# Patient Record
Sex: Male | Born: 1950
Health system: Southern US, Community
[De-identification: ages and names within clinical notes are randomized; demographics above are authoritative.]

## PROBLEM LIST (undated history)

## (undated) DIAGNOSIS — J189 Pneumonia, unspecified organism: Secondary | ICD-10-CM

## (undated) DIAGNOSIS — F17200 Nicotine dependence, unspecified, uncomplicated: Secondary | ICD-10-CM

## (undated) DIAGNOSIS — K5790 Diverticulosis of intestine, part unspecified, without perforation or abscess without bleeding: Secondary | ICD-10-CM

## (undated) DIAGNOSIS — E785 Hyperlipidemia, unspecified: Secondary | ICD-10-CM

## (undated) DIAGNOSIS — I10 Essential (primary) hypertension: Secondary | ICD-10-CM

## (undated) DIAGNOSIS — G4730 Sleep apnea, unspecified: Secondary | ICD-10-CM

## (undated) DIAGNOSIS — B1920 Unspecified viral hepatitis C without hepatic coma: Secondary | ICD-10-CM

## (undated) DIAGNOSIS — K635 Polyp of colon: Secondary | ICD-10-CM

## (undated) DIAGNOSIS — T7840XA Allergy, unspecified, initial encounter: Secondary | ICD-10-CM

## (undated) HISTORY — DX: Diverticulosis of intestine, part unspecified, without perforation or abscess without bleeding: K57.90

## (undated) HISTORY — PX: EYE SURGERY: SHX253

## (undated) HISTORY — PX: SKIN BIOPSY: SHX1

## (undated) HISTORY — DX: Nicotine dependence, unspecified, uncomplicated: F17.200

## (undated) HISTORY — DX: Sleep apnea, unspecified: G47.30

## (undated) HISTORY — DX: Unspecified viral hepatitis C without hepatic coma: B19.20

## (undated) HISTORY — DX: Allergy, unspecified, initial encounter: T78.40XA

## (undated) HISTORY — DX: Polyp of colon: K63.5

## (undated) HISTORY — DX: Hyperlipidemia, unspecified: E78.5

---

## 1999-10-28 ENCOUNTER — Ambulatory Visit (HOSPITAL_COMMUNITY): Admission: RE | Admit: 1999-10-28 | Discharge: 1999-10-28 | Payer: Self-pay | Admitting: Gastroenterology

## 1999-10-28 ENCOUNTER — Encounter (INDEPENDENT_AMBULATORY_CARE_PROVIDER_SITE_OTHER): Payer: Self-pay

## 2001-06-28 ENCOUNTER — Encounter (INDEPENDENT_AMBULATORY_CARE_PROVIDER_SITE_OTHER): Payer: Self-pay | Admitting: *Deleted

## 2001-06-28 ENCOUNTER — Ambulatory Visit (HOSPITAL_COMMUNITY): Admission: RE | Admit: 2001-06-28 | Discharge: 2001-06-28 | Payer: Self-pay | Admitting: Gastroenterology

## 2004-07-21 ENCOUNTER — Encounter: Admission: RE | Admit: 2004-07-21 | Discharge: 2004-07-21 | Payer: Self-pay | Admitting: Otolaryngology

## 2004-08-10 HISTORY — PX: ANTERIOR CRUCIATE LIGAMENT REPAIR: SHX115

## 2004-10-15 ENCOUNTER — Ambulatory Visit (HOSPITAL_COMMUNITY): Admission: RE | Admit: 2004-10-15 | Discharge: 2004-10-15 | Payer: Self-pay | Admitting: Orthopedic Surgery

## 2004-10-15 ENCOUNTER — Ambulatory Visit (HOSPITAL_BASED_OUTPATIENT_CLINIC_OR_DEPARTMENT_OTHER): Admission: RE | Admit: 2004-10-15 | Discharge: 2004-10-15 | Payer: Self-pay | Admitting: Orthopedic Surgery

## 2006-02-09 ENCOUNTER — Ambulatory Visit: Payer: Self-pay | Admitting: Family Medicine

## 2006-04-29 ENCOUNTER — Ambulatory Visit: Payer: Self-pay | Admitting: Family Medicine

## 2006-11-15 ENCOUNTER — Ambulatory Visit: Payer: Self-pay | Admitting: Family Medicine

## 2007-06-03 ENCOUNTER — Ambulatory Visit: Payer: Self-pay | Admitting: Family Medicine

## 2007-07-19 ENCOUNTER — Ambulatory Visit: Payer: Self-pay | Admitting: Family Medicine

## 2007-07-25 ENCOUNTER — Ambulatory Visit: Payer: Self-pay | Admitting: Family Medicine

## 2008-01-30 ENCOUNTER — Ambulatory Visit: Payer: Self-pay | Admitting: Family Medicine

## 2008-05-15 ENCOUNTER — Ambulatory Visit: Payer: Self-pay | Admitting: Family Medicine

## 2008-08-27 ENCOUNTER — Ambulatory Visit: Payer: Self-pay | Admitting: Family Medicine

## 2009-02-06 ENCOUNTER — Ambulatory Visit: Payer: Self-pay | Admitting: Family Medicine

## 2010-12-26 NOTE — Op Note (Signed)
Andrew Yang, Andrew Yang                ACCOUNT NO.:  [id number]   MEDICAL RECORD NO.:  [id number]          PATIENT TYPE:  AMB   LOCATION:  DSC                          FACILITY:  [facility]   PHYSICIAN:  Mila Homer. Sherlean Foot, M.D. DATE OF BIRTH:  1950-11-27   DATE OF PROCEDURE:  10/15/2004  DATE OF DISCHARGE:                                 OPERATIVE REPORT   PREOPERATIVE DIAGNOSIS:  Left knee anterior cruciate ligament tear.   POSTOPERATIVE DIAGNOSIS:  Left knee anterior cruciate ligament tear.   PROCEDURE:  Left knee anterior cruciate ligament reconstruction (allograft  bone tendon bone).   ANESTHESIA:  General plus preoperative femoral nerve block.   ASSISTANT:  Leanne Chang.   INDICATION FOR PROCEDURE:  The patient is a 61 year old white male with ACL  tear skiing last month.  Informed consent was obtained.   DESCRIPTION OF PROCEDURE:  The patient was laid supine and after  administration of general anesthesia the left leg was prepped and draped in  the usual sterile fashion.  Preoperative femoral nerve block was also  performed.  Inferolateral and inferomedial portals were created with a #11  blade for a blunt trocar and cannula.  Diagnostic arthroscopy revealed no  pathology in the patellofemoral joint.  The medial compartment had some  grade 1 to 2 very lateral OA changes which could have been the bone  contusion from the subluxation episode.  This was debrided with the  __________ shaver.  There was also a very obvious ACL tear with mop ends on  each side.  The lateral compartment was normal.  I then used the __________  shaver to remove the ACL.  I used the debridement wand to debride off the  lateral wall.  I then used the 4.0 mm cylindrical bur to perform an  aggressive notch plasty.  I then used the Arthrex instrumentation to drill  our tibial and femoral tunnels with a 2-mm posterior wall on the femoral  side.  I then passed the graft with the Beath needle.  The graft was  prepared at the beginning of the operation by my physician assistant.  I  then placed a 9.0 wire anterior to the femoral bone plug and placed a 7 x 25  mm metal screw.  I pulled tension to evaluate impingement and there was  none.  I then placed a second 7 x 25 mm screw with the leg in 10 degrees of  flexion with a posterior draw and tension on the graft.  This completely  eliminated the draw and Lachman.  Direct visualization and probing also  revealed that it was tight.  I then lavaged the knee and closed with  interrupted 2-0 Vicryl and the nylon throughout.  I dressed it with Adaptic,  4 x 4, sterile Webril and an Ace wrap.   COMPLICATIONS:  None.   DRAINS:  None.   TOURNIQUET TIME:  44 minutes.      SDL/MEDQ  D:  10/15/2004  T:  10/15/2004  Job:  [id number]

## 2011-04-08 ENCOUNTER — Encounter: Payer: Self-pay | Admitting: Family Medicine

## 2011-04-09 ENCOUNTER — Encounter: Payer: Self-pay | Admitting: Family Medicine

## 2011-04-09 ENCOUNTER — Ambulatory Visit (INDEPENDENT_AMBULATORY_CARE_PROVIDER_SITE_OTHER): Payer: BC Managed Care – PPO | Admitting: Family Medicine

## 2011-04-09 VITALS — BP 132/76 | HR 64 | Ht 72.0 in | Wt 213.0 lb

## 2011-04-09 DIAGNOSIS — R768 Other specified abnormal immunological findings in serum: Secondary | ICD-10-CM

## 2011-04-09 DIAGNOSIS — R894 Abnormal immunological findings in specimens from other organs, systems and tissues: Secondary | ICD-10-CM

## 2011-04-09 DIAGNOSIS — Z79899 Other long term (current) drug therapy: Secondary | ICD-10-CM

## 2011-04-09 DIAGNOSIS — Z125 Encounter for screening for malignant neoplasm of prostate: Secondary | ICD-10-CM

## 2011-04-09 DIAGNOSIS — Z23 Encounter for immunization: Secondary | ICD-10-CM

## 2011-04-09 DIAGNOSIS — E785 Hyperlipidemia, unspecified: Secondary | ICD-10-CM

## 2011-04-09 DIAGNOSIS — J309 Allergic rhinitis, unspecified: Secondary | ICD-10-CM

## 2011-04-09 NOTE — Progress Notes (Signed)
  Subjective:    Patient ID: Andrew Yang, male    DOB: 1951-05-16, 60 y.o.   MRN: [id number]  HPI He is here for a complete examination. He does have underlying allergies and they respond well to Actifed. He has not been on his statin in over a month. He has not been here in over 2 years. He does have underlying hepatitis see and would like a followup viral load. He is scheduled to see his gastroenterologist in the near future for colonoscopy. He does have underlying sleep apnea however is not using CPAP. He did have a dental appliance however he is also not using that. His work and home life are going well. Social and family history are in the chart and were reviewed.  Review of Systems Negative except as above    Objective:   Physical Exam BP 132/76  Pulse 64  Ht 6' (1.829 m)  Wt 213 lb (96.616 kg)  BMI 28.89 kg/m2  General Appearance:    Alert, cooperative, no distress, appears stated age  Head:    Normocephalic, without obvious abnormality, atraumatic  Eyes:    PERRL, conjunctiva/corneas clear, EOM's intact, fundi    benign  Ears:    Normal TM's and external ear canals  Nose:   Nares normal, mucosa normal, no drainage or sinus   tenderness  Throat:   Lips, mucosa, and tongue normal; teeth and gums normal  Neck:   Supple, no lymphadenopathy;  thyroid:  no   enlargement/tenderness/nodules; no carotid   bruit or JVD  Back:    Spine nontender, no curvature, ROM normal, no CVA     tenderness  Lungs:     Clear to auscultation bilaterally without wheezes, rales or     ronchi; respirations unlabored  Chest Wall:    No tenderness or deformity   Heart:    Regular rate and rhythm, S1 and S2 normal, no murmur, rub   or gallop  Breast Exam:    No chest wall tenderness, masses or gynecomastia  Abdomen:     Soft, non-tender, nondistended, normoactive bowel sounds,    no masses, no hepatosplenomegaly        Extremities:   No clubbing, cyanosis or edema  Pulses:   2+ and symmetric all  extremities  Skin:   Skin color, texture, turgor normal, no rashes or lesions  Lymph nodes:   Cervical, supraclavicular, and axillary nodes normal  Neurologic:   CNII-XII intact, normal strength, sensation and gait; reflexes 2+ and symmetric throughout          Psych:   Normal mood, affect, hygiene and grooming.           Assessment & Plan:  Allergic rhinitis. Hepatitis C. Hyperlipidemia. Sleep apnea. History of colonic polyps and hemorrhoids. Routine blood screening including PSA and hepatitis C viral load. Continue on present medication regimen.

## 2011-04-10 LAB — COMPREHENSIVE METABOLIC PANEL
ALT: 31 U/L (ref 0–53)
AST: 24 U/L (ref 0–37)
Albumin: 4.6 g/dL (ref 3.5–5.2)
Alkaline Phosphatase: 61 U/L (ref 39–117)
BUN: 13 mg/dL (ref 6–23)
CO2: 21 mEq/L (ref 19–32)
Calcium: 9.3 mg/dL (ref 8.4–10.5)
Chloride: 102 mEq/L (ref 96–112)
Creat: 0.97 mg/dL (ref 0.50–1.35)
Glucose, Bld: 99 mg/dL (ref 70–99)
Potassium: 4.4 mEq/L (ref 3.5–5.3)
Sodium: 138 mEq/L (ref 135–145)
Total Bilirubin: 0.7 mg/dL (ref 0.3–1.2)
Total Protein: 7.3 g/dL (ref 6.0–8.3)

## 2011-04-10 LAB — CBC WITH DIFFERENTIAL/PLATELET
Basophils Absolute: 0 10*3/uL (ref 0.0–0.1)
Basophils Relative: 1 % (ref 0–1)
Eosinophils Relative: 2 % (ref 0–5)
HCT: 46.6 % (ref 39.0–52.0)
Hemoglobin: 15.4 g/dL (ref 13.0–17.0)
MCH: 30.5 pg (ref 26.0–34.0)
MCHC: 33 g/dL (ref 30.0–36.0)
Monocytes Relative: 10 % (ref 3–12)
Neutro Abs: 3.7 10*3/uL (ref 1.7–7.7)
Neutrophils Relative %: 59 % (ref 43–77)
Platelets: 211 10*3/uL (ref 150–400)
RBC: 5.05 MIL/uL (ref 4.22–5.81)
RDW: 14.1 % (ref 11.5–15.5)
WBC: 6.2 10*3/uL (ref 4.0–10.5)

## 2011-04-10 LAB — LIPID PANEL
Cholesterol: 252 mg/dL — ABNORMAL HIGH (ref 0–200)
HDL: 35 mg/dL — ABNORMAL LOW (ref >39)
LDL Cholesterol: 175 mg/dL — ABNORMAL HIGH (ref 0–99)
Total CHOL/HDL Ratio: 7.2 Ratio
Triglycerides: 208 mg/dL — ABNORMAL HIGH (ref <150)
VLDL: 42 mg/dL — ABNORMAL HIGH (ref 0–40)

## 2011-04-10 LAB — PSA: PSA: 0.35 ng/mL (ref <=4.00)

## 2011-04-10 LAB — HEPATITIS C RNA QUANTITATIVE

## 2011-04-11 LAB — HM COLONOSCOPY

## 2011-04-14 ENCOUNTER — Telehealth: Payer: Self-pay

## 2011-04-14 ENCOUNTER — Telehealth: Payer: Self-pay | Admitting: Family Medicine

## 2011-04-14 NOTE — Telephone Encounter (Signed)
Left word with wife to have pt call and make appt to come in and discuss change of cholesterol med per [employer]

## 2011-04-15 MED ORDER — ATORVASTATIN CALCIUM 40 MG PO TABS: 40.0000 mg | ORAL_TABLET | Freq: Every day | ORAL | Status: DC

## 2011-04-15 NOTE — Telephone Encounter (Signed)
I will switch him to Lipitor and have him return here in 2 months

## 2011-04-17 ENCOUNTER — Ambulatory Visit (INDEPENDENT_AMBULATORY_CARE_PROVIDER_SITE_OTHER): Payer: BC Managed Care – PPO | Admitting: Family Medicine

## 2011-04-17 ENCOUNTER — Encounter: Payer: Self-pay | Admitting: Family Medicine

## 2011-04-17 VITALS — HR 67 | Wt 214.0 lb

## 2011-04-17 DIAGNOSIS — E785 Hyperlipidemia, unspecified: Secondary | ICD-10-CM

## 2011-04-17 DIAGNOSIS — F341 Dysthymic disorder: Secondary | ICD-10-CM

## 2011-04-17 MED ORDER — ROSUVASTATIN CALCIUM 20 MG PO TABS: 20.0000 mg | ORAL_TABLET | Freq: Every day | ORAL | Status: DC

## 2011-04-17 MED ORDER — VENLAFAXINE HCL ER 150 MG PO CP24: 150.0000 mg | ORAL_CAPSULE | Freq: Every day | ORAL | Status: DC

## 2011-04-17 MED ORDER — VENLAFAXINE HCL 75 MG PO TABS: 75.0000 mg | ORAL_TABLET | Freq: Two times a day (BID) | ORAL | Status: DC

## 2011-04-17 NOTE — Progress Notes (Signed)
  Subjective:    Patient ID: Andrew Yang, male    DOB: [birth year], 60 y.o.   MRN: [id number]  HPI He is here for consult concerning his cholesterol. He has been on medication for long period of time and in the past had been on Lipitor as well as Vytorin. His most recent blood work indicates a need for this to continue. He also recently stopped taking his Effexor and seems to be doing fairly well on this but has noted some increase in irritability. He would like to be placed back on this medication.  Review of Systems     Objective:   Physical Exam Alert and in no distress otherwise not examined       Assessment & Plan:   1. Hyperlipidemia LDL goal < 100   2. Dysthymia    I will place him on Crestor. Samples were given and instructions on proper use. I will also give him Effexor.

## 2011-04-20 LAB — HM COLONOSCOPY

## 2011-06-01 ENCOUNTER — Ambulatory Visit (INDEPENDENT_AMBULATORY_CARE_PROVIDER_SITE_OTHER): Payer: BC Managed Care – PPO | Admitting: Medical

## 2011-06-01 ENCOUNTER — Encounter: Payer: Self-pay | Admitting: Medical

## 2011-06-01 VITALS — BP 128/80 | HR 68 | Temp 97.9°F | Resp 16 | Ht 74.0 in | Wt 208.0 lb

## 2011-06-01 DIAGNOSIS — J029 Acute pharyngitis, unspecified: Secondary | ICD-10-CM

## 2011-06-01 DIAGNOSIS — E785 Hyperlipidemia, unspecified: Secondary | ICD-10-CM

## 2011-06-01 DIAGNOSIS — B1920 Unspecified viral hepatitis C without hepatic coma: Secondary | ICD-10-CM

## 2011-06-01 DIAGNOSIS — J329 Chronic sinusitis, unspecified: Secondary | ICD-10-CM

## 2011-06-01 DIAGNOSIS — G43909 Migraine, unspecified, not intractable, without status migrainosus: Secondary | ICD-10-CM | POA: Insufficient documentation

## 2011-06-01 MED ORDER — BUTALBITAL-APAP-CAFFEINE 50-300-40 MG PO CAPS: 50.0000 mg | ORAL_CAPSULE | ORAL | Status: DC | PRN

## 2011-06-01 MED ORDER — AMOXICILLIN 500 MG PO TABS: ORAL_TABLET | ORAL | Status: DC

## 2011-06-01 NOTE — Progress Notes (Signed)
Subjective:   HPI  Andrew Yang is a 60 y.o. male who presents for illness.  Here because he feels awful.  Has had bad cold for 4-5 days, sinus and nasal congestion, had fever to 102 last night, but he had been in the hot tub.  The throat is bad.  Sinus drainage burns his throat.  Using OTC cough suppressant.   Has dry "unproductive spasmatic cough."  Using Mucinex.  Daughter sick with similar.    He was here recently for physical and labs, but hasn't gotten call back on results.  He has questions about his Hep C, cholesterol and PSA labs.    No other c/o.  Past Medical History  Diagnosis Date  . Allergy     RHINITIS  . Dyslipidemia   . Smoker   . Hepatitis C   . Diverticulosis   . Colonic polyp   . Hemorrhoids   . Sleep apnea     Review of Systems Constitutional: + fever,+ chills,+ sweats,+ fatigue ,denies weight change ENT: + runny nose, ,+ sore throat, no ear pain Cardiology: denies chest pain, palpitations, edema Respiratory: DENIES, cough, shortness of breath, wheezing Gastroenterology: denies abdominal pain, nausea, vomiting, diarrhea, constipation Hematology: denies bleeding or bruising problems Musculoskeletal: denies arthralgias, myalgias, joint swelling, back pain Ophthalmology: denies vision changes Urology: denies dysuria, difficulty urinating, hematuria, urinary frequency, urgency Neurology: no headache, weakness, tingling, numbness   Objective:   Physical Exam  Filed Vitals:   06/01/11 1023  BP: 128/80  Pulse: 68  Temp: 97.9 F (36.6 C)  Resp: 16    General appearance: alert, no distress, WD/WN, ill appearing HEENT: normocephalic, sclerae anicteric, conjunctiva pink and moist, TMs pearly, nares with turbinate swelling, muculopurulent discharge and erythema, pharynx with erythema Oral cavity: MMM, no lesions Neck: supple, shoddy tender lymphadenopathy, no thyromegaly, no masses Heart: RRR, normal S1, S2, no murmurs Lungs: CTA bilaterally, no wheezes,  rhonchi, or rales Pulses: 2+ symmetric   Assessment and Plan :    Encounter Diagnoses  Name Primary?  . Sinusitis Yes  . Pharyngitis, acute   . Hepatitis C virus   . Hyperlipidemia   . Migraine    Sinusitis and pharyngitis - script for Amoxicillin, rest, hydrate well, discussed supportive care.  Calll if worse or not improving in 3-5 days.  Discussed his recent lab results.   Hep C virus - discussed recent labs which show no virus detected on Hep C viral load.  Hyperlipidemia - c/t Crestor, discussed compliance.  Migraine - refilled his medication today.

## 2011-06-01 NOTE — Progress Notes (Signed)
  Subjective:    Patient ID: Andrew Yang, male    DOB: Feb 09, 1951, 60 y.o.   MRN: [id number]  HPI    Review of Systems     Objective:   Physical Exam        Assessment & Plan:

## 2011-07-28 ENCOUNTER — Other Ambulatory Visit: Payer: Self-pay | Admitting: Dermatology

## 2011-08-20 ENCOUNTER — Telehealth: Payer: Self-pay | Admitting: Family Medicine

## 2011-08-20 MED ORDER — ROSUVASTATIN CALCIUM 10 MG PO TABS: 10.0000 mg | ORAL_TABLET | Freq: Every day | ORAL | Status: DC

## 2011-08-20 NOTE — Telephone Encounter (Signed)
Sent cholesterol med in

## 2011-08-20 NOTE — Telephone Encounter (Signed)
Sent crestor in 

## 2011-08-26 ENCOUNTER — Telehealth: Payer: Self-pay | Admitting: Family Medicine

## 2011-08-26 MED ORDER — ROSUVASTATIN CALCIUM 20 MG PO TABS: 20.0000 mg | ORAL_TABLET | Freq: Every day | ORAL | Status: DC

## 2011-08-26 NOTE — Telephone Encounter (Signed)
Crestor 20 mg written.

## 2012-04-30 ENCOUNTER — Other Ambulatory Visit: Payer: Self-pay | Admitting: Family Medicine

## 2012-05-02 NOTE — Telephone Encounter (Signed)
Pt is overdue for OV. Please ask him to call for an appt for further refills.Thanks.

## 2012-05-30 ENCOUNTER — Other Ambulatory Visit: Payer: Self-pay | Admitting: Dermatology

## 2012-06-25 ENCOUNTER — Other Ambulatory Visit: Payer: Self-pay | Admitting: Family Medicine

## 2012-07-04 ENCOUNTER — Other Ambulatory Visit: Payer: Self-pay | Admitting: Family Medicine

## 2012-07-04 ENCOUNTER — Telehealth: Payer: Self-pay | Admitting: Family Medicine

## 2012-07-04 ENCOUNTER — Telehealth: Payer: Self-pay

## 2012-07-04 ENCOUNTER — Other Ambulatory Visit: Payer: Self-pay

## 2012-07-04 DIAGNOSIS — Z79899 Other long term (current) drug therapy: Secondary | ICD-10-CM

## 2012-07-04 NOTE — Telephone Encounter (Signed)
And get the labs on that day and we need CBC, Cmet, lipids and hepatitis C viral load

## 2012-07-04 NOTE — Telephone Encounter (Signed)
Called pt to inform him he can have all blood drawn same day

## 2012-07-04 NOTE — Telephone Encounter (Signed)
Called and left message for pt he must have an med check before anymore refills I filled for 30 days

## 2012-07-06 ENCOUNTER — Other Ambulatory Visit: Payer: BC Managed Care – PPO

## 2012-07-06 ENCOUNTER — Other Ambulatory Visit: Payer: Self-pay | Admitting: Family Medicine

## 2012-07-06 DIAGNOSIS — Z79899 Other long term (current) drug therapy: Secondary | ICD-10-CM

## 2012-07-06 LAB — CBC WITH DIFFERENTIAL/PLATELET
Basophils Absolute: 0 10*3/uL (ref 0.0–0.1)
Basophils Relative: 1 % (ref 0–1)
Eosinophils Absolute: 0.1 10*3/uL (ref 0.0–0.7)
Eosinophils Relative: 2 % (ref 0–5)
HCT: 47.3 % (ref 39.0–52.0)
Hemoglobin: 16.1 g/dL (ref 13.0–17.0)
Lymphocytes Relative: 31 % (ref 12–46)
Lymphs Abs: 2 10*3/uL (ref 0.7–4.0)
MCH: 30.3 pg (ref 26.0–34.0)
MCHC: 34 g/dL (ref 30.0–36.0)
MCV: 89.1 fL (ref 78.0–100.0)
Monocytes Relative: 11 % (ref 3–12)
Neutro Abs: 3.5 10*3/uL (ref 1.7–7.7)
Neutrophils Relative %: 55 % (ref 43–77)
Platelets: 189 10*3/uL (ref 150–400)
RBC: 5.31 MIL/uL (ref 4.22–5.81)

## 2012-07-07 LAB — COMPREHENSIVE METABOLIC PANEL
ALT: 40 U/L (ref 0–53)
AST: 29 U/L (ref 0–37)
Albumin: 4.8 g/dL (ref 3.5–5.2)
Alkaline Phosphatase: 66 U/L (ref 39–117)
BUN: 12 mg/dL (ref 6–23)
Calcium: 9.4 mg/dL (ref 8.4–10.5)
Chloride: 103 mEq/L (ref 96–112)
Creat: 0.89 mg/dL (ref 0.50–1.35)
Glucose, Bld: 115 mg/dL — ABNORMAL HIGH (ref 70–99)
Potassium: 4.6 mEq/L (ref 3.5–5.3)
Sodium: 138 mEq/L (ref 135–145)
Total Bilirubin: 0.5 mg/dL (ref 0.3–1.2)
Total Protein: 7.4 g/dL (ref 6.0–8.3)

## 2012-07-08 LAB — HEMOGLOBIN A1C
Hgb A1c MFr Bld: 5.9 % — ABNORMAL HIGH (ref <5.7)
Mean Plasma Glucose: 123 mg/dL — ABNORMAL HIGH (ref <117)

## 2012-07-08 LAB — HEPATITIS C RNA QUANTITATIVE: HCV Quantitative: NOT DETECTED IU/mL (ref <15)

## 2012-07-26 ENCOUNTER — Ambulatory Visit (INDEPENDENT_AMBULATORY_CARE_PROVIDER_SITE_OTHER): Payer: BC Managed Care – PPO | Admitting: Family Medicine

## 2012-07-26 ENCOUNTER — Encounter: Payer: Self-pay | Admitting: Family Medicine

## 2012-07-26 VITALS — BP 140/82 | HR 78 | Ht 72.0 in | Wt 216.0 lb

## 2012-07-26 DIAGNOSIS — R768 Other specified abnormal immunological findings in serum: Secondary | ICD-10-CM

## 2012-07-26 DIAGNOSIS — E785 Hyperlipidemia, unspecified: Secondary | ICD-10-CM

## 2012-07-26 DIAGNOSIS — R7302 Impaired glucose tolerance (oral): Secondary | ICD-10-CM | POA: Insufficient documentation

## 2012-07-26 DIAGNOSIS — R7309 Other abnormal glucose: Secondary | ICD-10-CM

## 2012-07-26 DIAGNOSIS — Z23 Encounter for immunization: Secondary | ICD-10-CM

## 2012-07-26 DIAGNOSIS — Z0000 Encounter for general adult medical examination without abnormal findings: Secondary | ICD-10-CM

## 2012-07-26 DIAGNOSIS — J309 Allergic rhinitis, unspecified: Secondary | ICD-10-CM

## 2012-07-26 DIAGNOSIS — R7689 Other specified abnormal immunological findings in serum: Secondary | ICD-10-CM

## 2012-07-26 DIAGNOSIS — Z2911 Encounter for prophylactic immunotherapy for respiratory syncytial virus (RSV): Secondary | ICD-10-CM

## 2012-07-26 DIAGNOSIS — R894 Abnormal immunological findings in specimens from other organs, systems and tissues: Secondary | ICD-10-CM

## 2012-07-26 NOTE — Progress Notes (Signed)
GAVE PT ZOSTER,TDAP,AND FLU ALSO EKG

## 2012-07-26 NOTE — Progress Notes (Signed)
Subjective:    Patient ID: Andrew Yang, male    DOB: Aug 30, 1950, 61 y.o.   MRN: [id number]  HPI He is here for complete examination. He does have an underlying history of hepatitis C however he has had negative viral loads the last several years. Continues on Crestor and is having no difficulty with this. He has been on Effexor for several years and states that he wants to continue on this. He states it helps keep him stable. He does have intermittent difficulty with back pain and usually with physical therapy, anti-inflammatories and muscle relaxants he does fairly well. He does have underlying allergies and uses an antihistamine regularly. His work and home life are going quite well. He had a colonoscopy in 2000 and well. Immunizations were reviewed.   Review of Systems  Constitutional: Negative.   HENT: Negative.   Eyes: Negative.   Respiratory: Negative.   Cardiovascular: Negative.   Gastrointestinal: Negative.   Genitourinary: Negative.   Musculoskeletal: Negative.   Skin: Negative.   Neurological: Negative.   Hematological: Negative.   Psychiatric/Behavioral: Negative.        Objective:   Physical Exam BP 140/82  Pulse 78  Ht 6' (1.829 m)  Wt 216 lb (97.977 kg)  BMI 29.29 kg/m2  SpO2 97%  General Appearance:    Alert, cooperative, no distress, appears stated age  Head:    Normocephalic, without obvious abnormality, atraumatic  Eyes:    PERRL, conjunctiva/corneas clear, EOM's intact, fundi    benign  Ears:    Normal TM's and external ear canals  Nose:   Nares normal, mucosa normal, no drainage or sinus   tenderness  Throat:   Lips, mucosa, and tongue normal; teeth and gums normal  Neck:   Supple, no lymphadenopathy;  thyroid:  no/nodules; no carotid   bruit or JVD  Back:    Spine nontender, no curvature, ROM normal, no CVA     tenderness  Lungs:     Clear to auscultation bilaterally without wheezes, rales or     ronchi; respirations unlabored  Chest Wall:    No  tenderness or deformity   Heart:    Regular rate and rhythm, S1 and S2 normal, no murmur, rub   or gallop  Breast Exam:    No chest wall tenderness, masses or gynecomastia  Abdomen:     Soft, non-tender, nondistended, normoactive bowel sounds,    no masses, no hepatosplenomegaly  Genitalia:    Normal male external genitalia without lesions.  Testicles without masses.  No inguinal hernias.  Rectal:  deferred  Extremities:   No clubbing, cyanosis or edema  Pulses:   2+ and symmetric all extremities  Skin:   Skin color, texture, turgor normal, no rashes or lesions  Lymph nodes:   Cervical, supraclavicular, and axillary nodes normal  Neurologic:   CNII-XII intact, normal strength, sensation and gait; reflexes 2+ and symmetric throughout          Psych:   Normal mood, affect, hygiene and grooming.   Laboratory data was reviewed. Hemoglobin A1c is 5.9. Hepatitis C viral load is undetectable EKG shows no acute changes      Assessment & Plan:   1. Need for prophylactic vaccination and inoculation against influenza  Flu vaccine greater than or equal to 3yo preservative free IM  2. Immunization due  Tdap vaccine greater than or equal to 7yo IM  3. Hepatitis C antibody test positive    4. Hyperlipidemia LDL goal < 100  Lipid panel  5. Chronic allergic rhinitis    6. Routine general medical examination at a health care facility  PR ELECTROCARDIOGRAM, COMPLETE, Varicella-zoster vaccine subcutaneous, PSA  7. Glucose intolerance (impaired glucose tolerance)     discuss glucose intolerance and his risk for diabetes with this. Strongly encouraged him to make diet and exercise changes to help with this. Also suggested that he cut back on his alcohol consumption. His immunizations were updated. Risks and benefits of the shots were discussed.

## 2012-07-28 ENCOUNTER — Other Ambulatory Visit (INDEPENDENT_AMBULATORY_CARE_PROVIDER_SITE_OTHER): Payer: BC Managed Care – PPO

## 2012-07-28 DIAGNOSIS — E785 Hyperlipidemia, unspecified: Secondary | ICD-10-CM

## 2012-07-28 DIAGNOSIS — Z0000 Encounter for general adult medical examination without abnormal findings: Secondary | ICD-10-CM

## 2012-07-28 LAB — POCT URINALYSIS DIPSTICK
Bilirubin, UA: NEGATIVE
Blood, UA: NEGATIVE
Glucose, UA: NEGATIVE
Ketones, UA: NEGATIVE
Leukocytes, UA: NEGATIVE
Nitrite, UA: NEGATIVE
Protein, UA: NEGATIVE
Spec Grav, UA: 1.025
Urobilinogen, UA: NEGATIVE
pH, UA: 5

## 2012-07-28 LAB — LIPID PANEL
Cholesterol: 190 mg/dL (ref 0–200)
HDL: 41 mg/dL (ref >39)
LDL Cholesterol: 104 mg/dL — ABNORMAL HIGH (ref 0–99)
Total CHOL/HDL Ratio: 4.6 Ratio
Triglycerides: 224 mg/dL — ABNORMAL HIGH (ref <150)

## 2012-07-28 LAB — PSA: PSA: 0.39 ng/mL (ref <=4.00)

## 2012-08-01 NOTE — Progress Notes (Signed)
Quick Note:  Called pt home # left message lipids and psa look great continue on present meds ______

## 2012-08-28 ENCOUNTER — Other Ambulatory Visit: Payer: Self-pay | Admitting: Family Medicine

## 2012-09-09 ENCOUNTER — Other Ambulatory Visit: Payer: Self-pay | Admitting: Family Medicine

## 2013-01-05 ENCOUNTER — Other Ambulatory Visit: Payer: Self-pay | Admitting: Dermatology

## 2013-04-14 ENCOUNTER — Telehealth: Payer: Self-pay | Admitting: Internal Medicine

## 2013-04-14 ENCOUNTER — Other Ambulatory Visit: Payer: Self-pay | Admitting: Family Medicine

## 2013-04-14 MED ORDER — BUTALBITAL-APAP-CAFFEINE 50-300-40 MG PO CAPS: 50.0000 mg | ORAL_CAPSULE | ORAL | Status: DC | PRN

## 2013-04-14 NOTE — Telephone Encounter (Signed)
CALLED MED IN  

## 2013-04-14 NOTE — Telephone Encounter (Signed)
Refill request for butal-acet-caff 50-325-40mg  #30 to [facility]

## 2013-04-14 NOTE — Telephone Encounter (Signed)
Renew this 

## 2013-06-15 ENCOUNTER — Other Ambulatory Visit: Payer: Self-pay

## 2013-07-31 ENCOUNTER — Telehealth: Payer: Self-pay | Admitting: Internal Medicine

## 2013-07-31 MED ORDER — ONDANSETRON HCL 4 MG PO TABS: 4.0000 mg | ORAL_TABLET | Freq: Three times a day (TID) | ORAL | Status: DC | PRN

## 2013-07-31 NOTE — Telephone Encounter (Signed)
He has been having difficulty with vomiting nausea and diarrhea. I will try him initially on Zofran and if no improvement he will need to come in for an injection

## 2013-07-31 NOTE — Telephone Encounter (Signed)
Pt would like you to call him when you get a chance. He has a few questions to ask you about what he called in today for.

## 2013-07-31 NOTE — Telephone Encounter (Signed)
Pt wife called stating that pt has been throwing up for the last hour and having diarrhea for the last 4 hours constantly. Send to [facility]. Pt wife states he needs something for nausea

## 2013-09-11 ENCOUNTER — Other Ambulatory Visit: Payer: Self-pay | Admitting: Family Medicine

## 2013-09-21 ENCOUNTER — Other Ambulatory Visit: Payer: Self-pay | Admitting: Family Medicine

## 2013-09-21 NOTE — Telephone Encounter (Signed)
Pt needs RF Effexor, has CPE scheduled 10/03/13

## 2013-09-21 NOTE — Telephone Encounter (Signed)
Is this okay?

## 2013-09-21 NOTE — Telephone Encounter (Signed)
Pt scheduled for physical next week

## 2013-09-21 NOTE — Telephone Encounter (Signed)
Tell him that I called a month's worth in. Let him know that he needs a med check appointment

## 2013-10-03 ENCOUNTER — Encounter: Payer: Self-pay | Admitting: Family Medicine

## 2013-10-03 ENCOUNTER — Ambulatory Visit (INDEPENDENT_AMBULATORY_CARE_PROVIDER_SITE_OTHER): Payer: BC Managed Care – PPO | Admitting: Family Medicine

## 2013-10-03 VITALS — BP 170/100 | HR 80 | Ht 73.0 in | Wt 202.5 lb

## 2013-10-03 DIAGNOSIS — N529 Male erectile dysfunction, unspecified: Secondary | ICD-10-CM | POA: Insufficient documentation

## 2013-10-03 DIAGNOSIS — M129 Arthropathy, unspecified: Secondary | ICD-10-CM

## 2013-10-03 DIAGNOSIS — J309 Allergic rhinitis, unspecified: Secondary | ICD-10-CM

## 2013-10-03 DIAGNOSIS — E785 Hyperlipidemia, unspecified: Secondary | ICD-10-CM

## 2013-10-03 DIAGNOSIS — M25512 Pain in left shoulder: Secondary | ICD-10-CM

## 2013-10-03 DIAGNOSIS — R7302 Impaired glucose tolerance (oral): Secondary | ICD-10-CM

## 2013-10-03 DIAGNOSIS — R7309 Other abnormal glucose: Secondary | ICD-10-CM

## 2013-10-03 DIAGNOSIS — I10 Essential (primary) hypertension: Secondary | ICD-10-CM | POA: Insufficient documentation

## 2013-10-03 DIAGNOSIS — M25519 Pain in unspecified shoulder: Secondary | ICD-10-CM

## 2013-10-03 DIAGNOSIS — F341 Dysthymic disorder: Secondary | ICD-10-CM

## 2013-10-03 DIAGNOSIS — M1990 Unspecified osteoarthritis, unspecified site: Secondary | ICD-10-CM | POA: Insufficient documentation

## 2013-10-03 DIAGNOSIS — Z0000 Encounter for general adult medical examination without abnormal findings: Secondary | ICD-10-CM

## 2013-10-03 DIAGNOSIS — R768 Other specified abnormal immunological findings in serum: Secondary | ICD-10-CM

## 2013-10-03 DIAGNOSIS — R894 Abnormal immunological findings in specimens from other organs, systems and tissues: Secondary | ICD-10-CM

## 2013-10-03 LAB — COMPREHENSIVE METABOLIC PANEL
ALK PHOS: 57 U/L (ref 39–117)
ALT: 26 U/L (ref 0–53)
AST: 22 U/L (ref 0–37)
Albumin: 4.6 g/dL (ref 3.5–5.2)
BILIRUBIN TOTAL: 0.6 mg/dL (ref 0.2–1.2)
BUN: 11 mg/dL (ref 6–23)
CALCIUM: 9.3 mg/dL (ref 8.4–10.5)
CO2: 30 mEq/L (ref 19–32)
CREATININE: 0.84 mg/dL (ref 0.50–1.35)
Chloride: 101 mEq/L (ref 96–112)
Glucose, Bld: 103 mg/dL — ABNORMAL HIGH (ref 70–99)
Potassium: 4.4 mEq/L (ref 3.5–5.3)
Sodium: 137 mEq/L (ref 135–145)
Total Protein: 7.2 g/dL (ref 6.0–8.3)

## 2013-10-03 LAB — LIPID PANEL
CHOL/HDL RATIO: 4.8 ratio
Cholesterol: 201 mg/dL — ABNORMAL HIGH (ref 0–200)
HDL: 42 mg/dL (ref >39)
LDL Cholesterol: 123 mg/dL — ABNORMAL HIGH (ref 0–99)
Triglycerides: 181 mg/dL — ABNORMAL HIGH (ref <150)
VLDL: 36 mg/dL (ref 0–40)

## 2013-10-03 LAB — CBC WITH DIFFERENTIAL/PLATELET
BASOS ABS: 0 10*3/uL (ref 0.0–0.1)
BASOS PCT: 0 % (ref 0–1)
EOS ABS: 0.1 10*3/uL (ref 0.0–0.7)
EOS PCT: 2 % (ref 0–5)
HEMATOCRIT: 47.5 % (ref 39.0–52.0)
Hemoglobin: 16.4 g/dL (ref 13.0–17.0)
Lymphocytes Relative: 36 % (ref 12–46)
Lymphs Abs: 1.8 10*3/uL (ref 0.7–4.0)
MCH: 30.3 pg (ref 26.0–34.0)
MCHC: 34.5 g/dL (ref 30.0–36.0)
MCV: 87.8 fL (ref 78.0–100.0)
MONO ABS: 0.6 10*3/uL (ref 0.1–1.0)
Monocytes Relative: 12 % (ref 3–12)
Neutro Abs: 2.5 10*3/uL (ref 1.7–7.7)
Neutrophils Relative %: 50 % (ref 43–77)
PLATELETS: 184 10*3/uL (ref 150–400)
RBC: 5.41 MIL/uL (ref 4.22–5.81)
RDW: 14.3 % (ref 11.5–15.5)
WBC: 4.9 10*3/uL (ref 4.0–10.5)

## 2013-10-03 MED ORDER — VENLAFAXINE HCL 75 MG PO TABS: ORAL_TABLET | ORAL | Status: DC

## 2013-10-03 MED ORDER — LISINOPRIL-HYDROCHLOROTHIAZIDE 10-12.5 MG PO TABS: 1.0000 | ORAL_TABLET | Freq: Every day | ORAL | Status: DC

## 2013-10-03 MED ORDER — CELECOXIB 200 MG PO CAPS: 200.0000 mg | ORAL_CAPSULE | Freq: Two times a day (BID) | ORAL | Status: DC

## 2013-10-03 MED ORDER — ROSUVASTATIN CALCIUM 20 MG PO TABS: ORAL_TABLET | ORAL | Status: DC

## 2013-10-03 MED ORDER — TADALAFIL 5 MG PO TABS: 5.0000 mg | ORAL_TABLET | Freq: Every day | ORAL | Status: DC | PRN

## 2013-10-03 NOTE — Progress Notes (Signed)
Subjective:    Patient ID: Andrew Yang, male    DOB: 08-31-1950, 63 y.o.   MRN: [id number]  HPI He is here for a complete examination. He has been checking his blood pressure at home and states that it has been in the 160-70/100 range for the last 6 months. He does have underlying allergies and continues on Actifed. He notes change in weather patterns cause rhinorrhea. He has a previous history of hepatitis C with treatment and gets routine viral load followup. He continues on Crestor and is having no difficulty with that. He has noted some recent difficulty with getting and maintaining erections and would like help with this. Review of record indicates a previous history of glucose intolerance. He also has an underlying history of dysthymia and has been doing quite well on Zoloft. He wants to continue on this. He was recently seen for left shoulder pain and given an injection. The injected and lasted approximately one month and he is now having increased difficulty. He has occasional arthritic aches and pains especially in his back and does use Celebrex with good results. His family and social history were reviewed. 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 Immunizations and health maintenance issues were reviewed.   Review of Systems  All other systems reviewed and are negative.       Objective:   Physical Exam BP 170/100  Pulse 80  Ht 6\' 1"  (1.854 m)  Wt 202 lb 8 oz (91.853 kg)  BMI 26.72 kg/m2  General Appearance:    Alert, cooperative, no distress, appears stated age  Head:    Normocephalic, without obvious abnormality, atraumatic  Eyes:    PERRL, conjunctiva/corneas clear, EOM's intact, fundi    benign  Ears:    Normal TM's and external ear canals  Nose:   Nares normal, mucosa normal, no drainage or sinus   tenderness  Throat:   Lips, mucosa, and tongue normal; teeth and gums normal  Neck:   Supple, no lymphadenopathy;  thyroid:  no   enlargement/tenderness/nodules; no carotid  bruit or JVD  Back:    Spine nontender, no curvature, ROM normal, no CVA     tenderness  Lungs:     Clear to auscultation bilaterally without wheezes, rales or     ronchi; respirations unlabored  Chest Wall:    No tenderness or deformity   Heart:    Regular rate and rhythm, S1 and S2 normal, no murmur, rub   or gallop  Breast Exam:    No chest wall tenderness, masses or gynecomastia  Abdomen:     Soft, non-tender, nondistended, normoactive bowel sounds,    no masses, no hepatosplenomegaly  Genitalia:    Normal male external genitalia without lesions.  Testicles without masses.  No inguinal hernias.  Rectal:  Deferred. Stool cards given  Extremities:   No clubbing, cyanosis or edema  Pulses:   2+ and symmetric all extremities  Skin:   Skin color, texture, turgor normal, no rashes or lesions  Lymph nodes:   Cervical, supraclavicular, and axillary nodes normal  Neurologic:   CNII-XII intact, normal strength, sensation and gait; reflexes 2+ and symmetric throughout          Psych:   Normal mood, affect, hygiene and grooming.          Assessment & Plan:  Routine general medical examination at a health care facility - Plan: CBC with Differential, Comprehensive metabolic panel, Lipid panel  ED (erectile dysfunction) - Plan: tadalafil (  CIALIS) 5 MG tablet  Hypertension - Plan: lisinopril-hydrochlorothiazide (PRINZIDE,ZESTORETIC) 10-12.5 MG per tablet  Hyperlipidemia LDL goal < 100 - Plan: rosuvastatin (CRESTOR) 20 MG tablet, Lipid panel  Glucose intolerance (impaired glucose tolerance) - Plan: Comprehensive metabolic panel  Chronic allergic rhinitis  Hepatitis C antibody test positive - Plan: Hepatitis C RNA quantitative  Arthritis - Plan: celecoxib (CELEBREX) 200 MG capsule  Left shoulder pain  Dysthymia - Plan: venlafaxine (EFFEXOR) 75 MG tablet discussed the use of Cialis with him in regard to using it on a daily basis is supposed to as needed. Discussed the concept of priming  the pump in terms of not necessarily needing the medication on regular basis.  Samples were given and a prescription was written. I will place him on lisinopril/HCTZ. Discussed possible side effects including cough, swelling and ED issues. We'll also renew his Celebrex, Crestor and Effexor. Stool cards were given as he did have a episode of rectal bleeding today with a hard BM. Recheck blood pressure in one month.

## 2013-10-04 LAB — HEPATITIS C RNA QUANTITATIVE: HCV QUANT: NOT DETECTED [IU]/mL (ref <15)

## 2013-10-10 ENCOUNTER — Telehealth: Payer: Self-pay | Admitting: Family Medicine

## 2013-10-10 NOTE — Telephone Encounter (Signed)
Pt states he has tried Ibuprofen & Aleve and neither help control his symtpoms.  Also Ibuprofen upsets his stomach

## 2013-10-13 NOTE — Telephone Encounter (Signed)
P.A. Approved, pt informed, faxed pharmacy 

## 2013-10-30 ENCOUNTER — Encounter: Payer: Self-pay | Admitting: Family Medicine

## 2013-10-30 ENCOUNTER — Ambulatory Visit (INDEPENDENT_AMBULATORY_CARE_PROVIDER_SITE_OTHER): Payer: BC Managed Care – PPO | Admitting: Family Medicine

## 2013-10-30 VITALS — BP 150/98 | HR 64 | Wt 208.0 lb

## 2013-10-30 DIAGNOSIS — I10 Essential (primary) hypertension: Secondary | ICD-10-CM

## 2013-10-30 MED ORDER — LISINOPRIL-HYDROCHLOROTHIAZIDE 20-12.5 MG PO TABS: 1.0000 | ORAL_TABLET | Freq: Every day | ORAL | Status: DC

## 2013-10-30 NOTE — Progress Notes (Signed)
   Subjective:    Patient ID: Andrew Yang, male    DOB: 01-07-1951, 63 y.o.   MRN: [id number]  HPI He is here for recheck on his blood pressure. He did bring his BP cuff with him. It is approximately 3 or 4 points higher with both diastolic and systolic than ours.   Review of Systems     Objective:   Physical Exam Alert and in no distress. Blood pressure is recorded.       Assessment & Plan:  Hypertension - Plan: lisinopril-hydrochlorothiazide (ZESTORETIC) 20-12.5 MG per tablet  I will increase his Zestoretic to 20/12.5. He will e-mail me in about one month and let he know what blood pressure readings he is getting on his machine. Explained over time he will need more medication to control his blood pressure.

## 2013-10-30 NOTE — Patient Instructions (Signed)
E-mail me in a month with your blood pressure readings

## 2013-10-31 ENCOUNTER — Other Ambulatory Visit: Payer: Self-pay | Admitting: Dermatology

## 2014-01-16 ENCOUNTER — Other Ambulatory Visit: Payer: Self-pay | Admitting: Family Medicine

## 2014-04-17 ENCOUNTER — Encounter: Payer: Self-pay | Admitting: Family Medicine

## 2014-04-17 ENCOUNTER — Ambulatory Visit (INDEPENDENT_AMBULATORY_CARE_PROVIDER_SITE_OTHER): Payer: BC Managed Care – PPO | Admitting: Family Medicine

## 2014-04-17 VITALS — BP 180/120 | HR 80 | Wt 210.0 lb

## 2014-04-17 DIAGNOSIS — N529 Male erectile dysfunction, unspecified: Secondary | ICD-10-CM

## 2014-04-17 DIAGNOSIS — G4733 Obstructive sleep apnea (adult) (pediatric): Secondary | ICD-10-CM

## 2014-04-17 DIAGNOSIS — T464X5A Adverse effect of angiotensin-converting-enzyme inhibitors, initial encounter: Secondary | ICD-10-CM

## 2014-04-17 DIAGNOSIS — I10 Essential (primary) hypertension: Secondary | ICD-10-CM

## 2014-04-17 DIAGNOSIS — R059 Cough, unspecified: Secondary | ICD-10-CM

## 2014-04-17 DIAGNOSIS — T465X5A Adverse effect of other antihypertensive drugs, initial encounter: Secondary | ICD-10-CM

## 2014-04-17 DIAGNOSIS — Z23 Encounter for immunization: Secondary | ICD-10-CM

## 2014-04-17 DIAGNOSIS — R058 Other specified cough: Secondary | ICD-10-CM | POA: Insufficient documentation

## 2014-04-17 DIAGNOSIS — R05 Cough: Secondary | ICD-10-CM

## 2014-04-17 DIAGNOSIS — M549 Dorsalgia, unspecified: Secondary | ICD-10-CM

## 2014-04-17 DIAGNOSIS — G8929 Other chronic pain: Secondary | ICD-10-CM | POA: Insufficient documentation

## 2014-04-17 MED ORDER — TADALAFIL 20 MG PO TABS: 20.0000 mg | ORAL_TABLET | Freq: Every day | ORAL | Status: DC | PRN

## 2014-04-17 MED ORDER — METAXALONE 800 MG PO TABS: 800.0000 mg | ORAL_TABLET | ORAL | Status: DC | PRN

## 2014-04-17 MED ORDER — LOSARTAN POTASSIUM-HCTZ 100-12.5 MG PO TABS: 1.0000 | ORAL_TABLET | Freq: Every day | ORAL | Status: DC

## 2014-04-17 NOTE — Progress Notes (Signed)
   Subjective:    Patient ID: Andrew Yang, male    DOB: March 27, 1951, 63 y.o.   MRN: [id number]  HPI He is here for consultation. He did have difficulty with cough from his lisinopril and needs to be switched to a different medication. He has been off medication for approximately 4 months. He would also like a refill on Cialis. Apparently his insurance would not cover the 5 mg dosing. He also has a history of chronic back pain and does occasionally require the use of Skelaxin. He would also like to be referred to physical therapy to help with this. He has a previous history of sleep apnea however he has not been using the CPAP machine for quite some time. In fact he actually turned it back in. His wife is now strongly encouraged to him to get a machine again. This was diagnosed in 2010. Alert and in no distress otherwise not examined   Review of Systems     Objective:   Physical Exam        Assessment & Plan:  Obstructive sleep apnea  Need for prophylactic vaccination and inoculation against influenza - Plan: Flu Vaccine QUAD 36+ mos PF IM (Fluarix Quad PF)  Essential hypertension - Plan: losartan-hydrochlorothiazide (HYZAAR) 100-12.5 MG per tablet  ACE-inhibitor cough  Erectile dysfunction, unspecified erectile dysfunction type - Plan: tadalafil (CIALIS) 20 MG tablet  Chronic back pain - Plan: Ambulatory referral to Physical Therapy, metaxalone (SKELAXIN) 800 MG tablet  flu shot was given. I will give him a referral to physical therapy as well as given Skelaxin and Cialis. He will start on losartan and return here in one month for recheck. I will see if we can order another CPAP machine or whether we need to do another study. Only 5 minutes spent discussing all these issues with him.

## 2014-04-18 ENCOUNTER — Other Ambulatory Visit: Payer: Self-pay

## 2014-04-18 NOTE — Telephone Encounter (Signed)
CALLED IN SKALAXION PER JCL

## 2014-04-19 ENCOUNTER — Telehealth: Payer: Self-pay

## 2014-04-19 NOTE — Telephone Encounter (Signed)
I CALLED PT TO INFORM HIM OF THE NEED FOR NEW SLEEP STUDY HE SAID HE WANTED A HOME SLEEP STUDY AND I TOLD HIM I DIDN'T THINK [employer] ACCEPTS HOME SLEEP STUDY'S HE SAID THEY DID LAST TIME I TOLD HIM OKAY I WOULD GET ONE SET UP FOR HIM I HAVE FAXED ALL INFO TO [employer]

## 2014-04-19 NOTE — Telephone Encounter (Signed)
Go ahead and let him know this and set up to do it at home

## 2014-04-19 NOTE — Telephone Encounter (Signed)
[facility] called and left me a message that since Andrew Yang was noncompliant in 2010 he will have to have another sleep study please advise

## 2014-04-26 ENCOUNTER — Telehealth: Payer: Self-pay | Admitting: Family Medicine

## 2014-04-26 NOTE — Telephone Encounter (Signed)
Initiated P.A. CIALIS

## 2014-04-28 NOTE — Telephone Encounter (Signed)
P.A. Approved Cialis, left message for pt, faxed pharmacy

## 2014-05-08 ENCOUNTER — Telehealth: Payer: Self-pay | Admitting: Internal Medicine

## 2014-05-08 DIAGNOSIS — M1990 Unspecified osteoarthritis, unspecified site: Secondary | ICD-10-CM

## 2014-05-08 MED ORDER — CELECOXIB 200 MG PO CAPS: 200.0000 mg | ORAL_CAPSULE | Freq: Two times a day (BID) | ORAL | Status: DC

## 2014-05-08 NOTE — Telephone Encounter (Signed)
Refill request for celecoxib 200mg  to [facility]

## 2014-06-09 ENCOUNTER — Other Ambulatory Visit: Payer: Self-pay | Admitting: Family Medicine

## 2014-06-11 NOTE — Telephone Encounter (Signed)
Dr.Lalonde is this okay 

## 2014-07-02 ENCOUNTER — Ambulatory Visit (INDEPENDENT_AMBULATORY_CARE_PROVIDER_SITE_OTHER): Payer: BC Managed Care – PPO | Admitting: Family Medicine

## 2014-07-02 ENCOUNTER — Encounter: Payer: Self-pay | Admitting: Family Medicine

## 2014-07-02 VITALS — BP 142/98 | HR 85 | Wt 217.0 lb

## 2014-07-02 DIAGNOSIS — I10 Essential (primary) hypertension: Secondary | ICD-10-CM

## 2014-07-02 MED ORDER — AMLODIPINE BESYLATE 5 MG PO TABS: 5.0000 mg | ORAL_TABLET | Freq: Every day | ORAL | Status: DC

## 2014-07-02 NOTE — Progress Notes (Signed)
   Subjective:    Patient ID: Andrew Yang, male    DOB: 02-Oct-1950, 63 y.o.   MRN: [id number]  HPI He is here for blood pressure recheck. He has occasionally had readings in the 180 210 range. He continues on his present medications and is having no difficulty with them.   Review of Systems     Objective:   Physical Exam Alert and in no distress. Blood pressure is recorded       Assessment & Plan:  Essential hypertension - Plan: amLODipine (NORVASC) 5 MG tablet  scuffs possible side effects with him. Also answered questions concerning possible cardiology referral. Since he really has no other risk factors, further cardiac evaluation is really not needed at this point.

## 2014-07-12 ENCOUNTER — Ambulatory Visit
Admission: RE | Admit: 2014-07-12 | Discharge: 2014-07-12 | Disposition: A | Discharge status: Home / Self Care | Payer: BC Managed Care – PPO | Source: Ambulatory Visit | Attending: Medical | Admitting: Medical

## 2014-07-12 ENCOUNTER — Other Ambulatory Visit: Payer: Self-pay | Admitting: Medical

## 2014-07-12 ENCOUNTER — Encounter: Payer: Self-pay | Admitting: Medical

## 2014-07-12 ENCOUNTER — Ambulatory Visit (INDEPENDENT_AMBULATORY_CARE_PROVIDER_SITE_OTHER): Payer: BC Managed Care – PPO | Admitting: Medical

## 2014-07-12 VITALS — BP 122/80 | HR 76 | Temp 97.5°F | Resp 16 | Wt 212.0 lb

## 2014-07-12 DIAGNOSIS — H6502 Acute serous otitis media, left ear: Secondary | ICD-10-CM

## 2014-07-12 DIAGNOSIS — R0789 Other chest pain: Secondary | ICD-10-CM

## 2014-07-12 DIAGNOSIS — Z720 Tobacco use: Secondary | ICD-10-CM

## 2014-07-12 DIAGNOSIS — R059 Cough, unspecified: Secondary | ICD-10-CM

## 2014-07-12 DIAGNOSIS — R918 Other nonspecific abnormal finding of lung field: Secondary | ICD-10-CM

## 2014-07-12 DIAGNOSIS — R05 Cough: Secondary | ICD-10-CM

## 2014-07-12 DIAGNOSIS — F17200 Nicotine dependence, unspecified, uncomplicated: Secondary | ICD-10-CM

## 2014-07-12 MED ORDER — AMOXICILLIN-POT CLAVULANATE 875-125 MG PO TABS: 1.0000 | ORAL_TABLET | Freq: Two times a day (BID) | ORAL | Status: DC

## 2014-07-12 MED ORDER — HYDROCODONE-HOMATROPINE 5-1.5 MG/5ML PO SYRP: 5.0000 mL | ORAL_SOLUTION | Freq: Three times a day (TID) | ORAL | Status: DC | PRN

## 2014-07-12 NOTE — Progress Notes (Signed)
Subjective:  Andrew Yang is a 63 y.o. male who presents for illness.  Symptoms include 3 day history of head cold, cough that is productive, chest tightness, ear pressure month clear runny nose, yellow productive sputum.   Denies fever, chills, nausea, vomiting, diarrhea, headache, sore throat.  Treatment to date: mucinex and dayqul/nyquil at times.  +sick contact, work partner has pneumonia.   He does smoke.   He does not have a history of bronchitis.   No other aggravating or relieving factors.  No other c/o.  The following portions of the patient's history were reviewed and updated as appropriate: allergies, current medications, past family history, past medical history, past social history, past surgical history and problem list.  ROS as in subjective  Past Medical History  Diagnosis Date  . Allergy     RHINITIS  . Dyslipidemia   . Smoker   . Hepatitis C   . Diverticulosis   . Colonic polyp   . Hemorrhoids   . Sleep apnea      Objective: BP 122/80 mmHg  Pulse 76  Temp(Src) 97.5 F (36.4 C) (Oral)  Resp 16  Wt 212 lb (96.163 kg)   General appearance: Alert, WD/WN, no distress                             Skin: warm, no rash, no diaphoresis                           Head: no sinus tenderness                            Eyes: conjunctiva normal, corneas clear, PERRLA                            Ears: Left TM appears to be somewhat bulging, mild erythema, right TM normal, external ear canals normal                          Nose: septum midline, turbinates swollen, with erythema and clear discharge             Mouth/throat: MMM, tongue normal, mild pharyngeal erythema                           Neck: supple, no adenopathy, no thyromegaly, nontender                          Heart: RRR, normal S1, S2, no murmurs                         Lungs: +bronchial breath sounds, +scattered rhonchi, faint wheezes, decreased breath sounds and decreased percussion in right mid and lower fields,  no rales                Extremities: no edema, nontender     Assessment: Encounter Diagnoses  Name Primary?  . Cough Yes  . Chest tightness   . Lung field abnormal   . Smoker   . Acute serous otitis media of left ear, recurrence not specified      Plan:  We'll send for chest x-ray, advise good hydration, rest, Hycodan syrup when necessary for worse cough,  can continue Mucinex. Discussed exam findings including abnormal lung sounds and left ear drum appears to be infected.  We will call back with x-ray results in a few hours and decide on antibiotic.  Call/return in 2-3 days if symptoms are worse or not improving.

## 2014-07-20 ENCOUNTER — Encounter: Payer: Self-pay | Admitting: Family Medicine

## 2014-07-20 ENCOUNTER — Ambulatory Visit (INDEPENDENT_AMBULATORY_CARE_PROVIDER_SITE_OTHER): Payer: BC Managed Care – PPO | Admitting: Family Medicine

## 2014-07-20 VITALS — BP 144/90 | HR 77 | Wt 212.0 lb

## 2014-07-20 DIAGNOSIS — R0789 Other chest pain: Secondary | ICD-10-CM

## 2014-07-20 DIAGNOSIS — R059 Cough, unspecified: Secondary | ICD-10-CM

## 2014-07-20 DIAGNOSIS — R05 Cough: Secondary | ICD-10-CM

## 2014-07-20 MED ORDER — AMOXICILLIN-POT CLAVULANATE 875-125 MG PO TABS: 1.0000 | ORAL_TABLET | Freq: Two times a day (BID) | ORAL | Status: DC

## 2014-07-20 MED ORDER — ALBUTEROL SULFATE 108 (90 BASE) MCG/ACT IN AEPB: 2.0000 | INHALATION_SPRAY | Freq: Four times a day (QID) | RESPIRATORY_TRACT | Status: DC | PRN

## 2014-07-20 MED ORDER — HYDROCODONE-HOMATROPINE 5-1.5 MG/5ML PO SYRP: 5.0000 mL | ORAL_SOLUTION | Freq: Three times a day (TID) | ORAL | Status: DC | PRN

## 2014-07-20 NOTE — Progress Notes (Signed)
   Subjective:    Patient ID: Andrew Yang, male    DOB: 04-27-1951, 63 y.o.   MRN: [id number]  HPI He is here for recheck. He was placed on Augmentin and proximally one week ago. He states that he is roughly 80% better but still having a lot of difficulty with chest congestion, rattling sensation and coughing. No fever, chills or malaise.   Review of Systems     Objective:   Physical Exam alert and in no distress. Tympanic membranes and canals are normal. Throat is clear. Tonsils are normal. Neck is supple without adenopathy or thyromegaly. Cardiac exam shows a regular sinus rhythm without murmurs or gallops. Lungs show diffuse rhonchi.        Assessment & Plan:  Chest tightness - Plan: amoxicillin-clavulanate (AUGMENTIN) 875-125 MG per tablet, Albuterol Sulfate (PROAIR RESPICLICK) 435 (90 BASE) MCG/ACT AEPB, HYDROcodone-homatropine (HYCODAN) 5-1.5 MG/5ML syrup  Cough - Plan: amoxicillin-clavulanate (AUGMENTIN) 875-125 MG per tablet, Albuterol Sulfate (PROAIR RESPICLICK) 686 (90 BASE) MCG/ACT AEPB, HYDROcodone-homatropine (HYCODAN) 5-1.5 MG/5ML syrup  he has 3 more days of the Augmentin which I don't think will be enough to clearing up. I will give him more Augmentin, having use the albuterol inhaler to help with his congestion and renew his Hycodan. He will call if further trouble.

## 2014-10-26 ENCOUNTER — Other Ambulatory Visit: Payer: Self-pay | Admitting: Family Medicine

## 2014-10-26 NOTE — Telephone Encounter (Signed)
Is this okay to refill? 

## 2014-11-01 ENCOUNTER — Other Ambulatory Visit: Payer: Self-pay | Admitting: Family Medicine

## 2014-11-03 ENCOUNTER — Telehealth: Payer: Self-pay | Admitting: Family Medicine

## 2014-11-05 NOTE — Telephone Encounter (Signed)
P.A. Celecoxib approved til 11/03/15, pt informed, faxed pharmacy

## 2014-11-22 ENCOUNTER — Other Ambulatory Visit: Payer: Self-pay | Admitting: Family Medicine

## 2015-04-23 ENCOUNTER — Other Ambulatory Visit: Payer: Self-pay | Admitting: Family Medicine

## 2015-04-23 NOTE — Telephone Encounter (Signed)
IS THIS OKAY TO REFILL 

## 2015-05-07 ENCOUNTER — Other Ambulatory Visit: Payer: Self-pay | Admitting: Family Medicine

## 2015-05-27 ENCOUNTER — Telehealth: Payer: Self-pay | Admitting: Family Medicine

## 2015-05-27 ENCOUNTER — Other Ambulatory Visit: Payer: Self-pay | Admitting: Family Medicine

## 2015-05-27 ENCOUNTER — Other Ambulatory Visit: Payer: Self-pay

## 2015-05-27 DIAGNOSIS — Z0000 Encounter for general adult medical examination without abnormal findings: Secondary | ICD-10-CM

## 2015-05-27 NOTE — Telephone Encounter (Signed)
IS THIS OKAY TO REFILL 

## 2015-05-27 NOTE — Telephone Encounter (Signed)
done

## 2015-05-27 NOTE — Telephone Encounter (Signed)
Okay to call this in.

## 2015-05-27 NOTE — Telephone Encounter (Addendum)
Pt called and made a cpe for nov the 16th and was requesting to come in for labs nov the 3rd if that was ok that way he didn't have to wait all day for labs since he will be fasting, just wanted to make sure since i know there has to be orders in the computer for this. Pt also was requesting  A refill for his skelaxin [facility] [address] [facility] [place]

## 2015-06-11 ENCOUNTER — Other Ambulatory Visit: Payer: Self-pay | Admitting: Family Medicine

## 2015-06-11 NOTE — Telephone Encounter (Signed)
Pt has an appt 06/26/15

## 2015-06-13 ENCOUNTER — Other Ambulatory Visit (INDEPENDENT_AMBULATORY_CARE_PROVIDER_SITE_OTHER): Payer: BLUE CROSS/BLUE SHIELD

## 2015-06-13 ENCOUNTER — Other Ambulatory Visit: Payer: Self-pay | Admitting: Family Medicine

## 2015-06-13 DIAGNOSIS — R768 Other specified abnormal immunological findings in serum: Secondary | ICD-10-CM

## 2015-06-13 DIAGNOSIS — Z23 Encounter for immunization: Secondary | ICD-10-CM | POA: Diagnosis not present

## 2015-06-13 DIAGNOSIS — Z0000 Encounter for general adult medical examination without abnormal findings: Secondary | ICD-10-CM

## 2015-06-13 DIAGNOSIS — Z125 Encounter for screening for malignant neoplasm of prostate: Secondary | ICD-10-CM

## 2015-06-13 LAB — CBC WITH DIFFERENTIAL/PLATELET
Basophils Absolute: 0.1 10*3/uL (ref 0.0–0.1)
Basophils Relative: 1 % (ref 0–1)
EOS ABS: 0.2 10*3/uL (ref 0.0–0.7)
EOS PCT: 3 % (ref 0–5)
HCT: 44.1 % (ref 39.0–52.0)
Hemoglobin: 14.9 g/dL (ref 13.0–17.0)
LYMPHS ABS: 1.9 10*3/uL (ref 0.7–4.0)
Lymphocytes Relative: 32 % (ref 12–46)
MCH: 30.5 pg (ref 26.0–34.0)
MCHC: 33.8 g/dL (ref 30.0–36.0)
MCV: 90.4 fL (ref 78.0–100.0)
MONO ABS: 0.8 10*3/uL (ref 0.1–1.0)
MONOS PCT: 13 % — AB (ref 3–12)
MPV: 11.2 fL (ref 8.6–12.4)
Neutro Abs: 3.1 10*3/uL (ref 1.7–7.7)
Neutrophils Relative %: 51 % (ref 43–77)
PLATELETS: 201 10*3/uL (ref 150–400)
RBC: 4.88 MIL/uL (ref 4.22–5.81)
RDW: 14.1 % (ref 11.5–15.5)
WBC: 6 10*3/uL (ref 4.0–10.5)

## 2015-06-13 LAB — COMPREHENSIVE METABOLIC PANEL
ALBUMIN: 4.4 g/dL (ref 3.6–5.1)
ALT: 26 U/L (ref 9–46)
AST: 23 U/L (ref 10–35)
Alkaline Phosphatase: 63 U/L (ref 40–115)
BILIRUBIN TOTAL: 0.5 mg/dL (ref 0.2–1.2)
BUN: 12 mg/dL (ref 7–25)
CHLORIDE: 102 mmol/L (ref 98–110)
CO2: 27 mmol/L (ref 20–31)
CREATININE: 0.92 mg/dL (ref 0.70–1.25)
Calcium: 9.2 mg/dL (ref 8.6–10.3)
GLUCOSE: 102 mg/dL — AB (ref 65–99)
Potassium: 4.4 mmol/L (ref 3.5–5.3)
SODIUM: 135 mmol/L (ref 135–146)
Total Protein: 7 g/dL (ref 6.1–8.1)

## 2015-06-13 LAB — LIPID PANEL
Cholesterol: 174 mg/dL (ref 125–200)
HDL: 37 mg/dL — ABNORMAL LOW (ref >=40)
LDL CALC: 98 mg/dL (ref <130)
Total CHOL/HDL Ratio: 4.7 Ratio (ref <=5.0)
Triglycerides: 196 mg/dL — ABNORMAL HIGH (ref <150)
VLDL: 39 mg/dL — AB (ref <30)

## 2015-06-14 LAB — PSA: PSA: 0.38 ng/mL (ref <=4.00)

## 2015-06-16 LAB — HEPATITIS C RNA QUANTITATIVE: HCV Quantitative: NOT DETECTED IU/mL (ref <15)

## 2015-06-26 ENCOUNTER — Other Ambulatory Visit: Payer: Self-pay

## 2015-06-26 ENCOUNTER — Encounter: Payer: Self-pay | Admitting: Family Medicine

## 2015-06-26 ENCOUNTER — Ambulatory Visit (INDEPENDENT_AMBULATORY_CARE_PROVIDER_SITE_OTHER): Payer: BLUE CROSS/BLUE SHIELD | Admitting: Family Medicine

## 2015-06-26 VITALS — BP 138/100 | HR 80 | Ht 73.0 in | Wt 213.0 lb

## 2015-06-26 DIAGNOSIS — R7302 Impaired glucose tolerance (oral): Secondary | ICD-10-CM

## 2015-06-26 DIAGNOSIS — G4733 Obstructive sleep apnea (adult) (pediatric): Secondary | ICD-10-CM | POA: Diagnosis not present

## 2015-06-26 DIAGNOSIS — Z0000 Encounter for general adult medical examination without abnormal findings: Secondary | ICD-10-CM | POA: Diagnosis not present

## 2015-06-26 DIAGNOSIS — R05 Cough: Secondary | ICD-10-CM

## 2015-06-26 DIAGNOSIS — I10 Essential (primary) hypertension: Secondary | ICD-10-CM

## 2015-06-26 DIAGNOSIS — T464X5A Adverse effect of angiotensin-converting-enzyme inhibitors, initial encounter: Secondary | ICD-10-CM

## 2015-06-26 DIAGNOSIS — N529 Male erectile dysfunction, unspecified: Secondary | ICD-10-CM

## 2015-06-26 DIAGNOSIS — R894 Abnormal immunological findings in specimens from other organs, systems and tissues: Secondary | ICD-10-CM | POA: Diagnosis not present

## 2015-06-26 DIAGNOSIS — R768 Other specified abnormal immunological findings in serum: Secondary | ICD-10-CM

## 2015-06-26 DIAGNOSIS — J309 Allergic rhinitis, unspecified: Secondary | ICD-10-CM

## 2015-06-26 DIAGNOSIS — R7689 Other specified abnormal immunological findings in serum: Secondary | ICD-10-CM

## 2015-06-26 DIAGNOSIS — R058 Other specified cough: Secondary | ICD-10-CM

## 2015-06-26 MED ORDER — AZILSARTAN-CHLORTHALIDONE 40-12.5 MG PO TABS: 1.0000 | ORAL_TABLET | Freq: Every day | ORAL | Status: DC

## 2015-06-26 MED ORDER — TADALAFIL 20 MG PO TABS: 20.0000 mg | ORAL_TABLET | Freq: Every day | ORAL | Status: DC | PRN

## 2015-06-26 MED ORDER — BUTALBITAL-APAP-CAFFEINE 50-300-40 MG PO CAPS: 50.0000 mg | ORAL_CAPSULE | ORAL | Status: AC | PRN

## 2015-06-26 NOTE — Progress Notes (Signed)
   Subjective:    Patient ID: Andrew Yang, male    DOB: 01/28/1951, 64 y.o.   MRN: [id number]  HPI He is here for complete examination. He does have sleep apnea but has not been on a CPAP. He had logistics difficulty trying to get a machine from [place] common [place]part [place] from [place]. He does have hypertension and continues on medications listed in the chart. He does have a history of A's cough. He also has history of glucose intolerance. His allergies are under good control. He also has ED. He does occasionally have low back pain and uses Skelaxin with good results. He also uses Celebrex for this. Otherwise he has no particular concerns or complaints. Work is going well. Family and social history as well as health maintenance and immunizations were reviewed. He's had no difficulty with cough, congestion, abdominal pain, urinary problems.   Review of Systems  All other systems reviewed and are negative.      Objective:   Physical Exam BP 138/100 mmHg  Pulse 80  Ht 6\' 1"  (1.854 m)  Wt 213 lb (96.616 kg)  BMI 28.11 kg/m2  SpO2 99%  General Appearance:    Alert, cooperative, no distress, appears stated age  Head:    Normocephalic, without obvious abnormality, atraumatic  Eyes:    PERRL, conjunctiva/corneas clear, EOM's intact, fundi    benign  Ears:    Normal TM's and external ear canals  Nose:   Nares normal, mucosa normal, no drainage or sinus   tenderness  Throat:   Lips, mucosa, and tongue normal; teeth and gums normal  Neck:   Supple, no lymphadenopathy;  thyroid:  no   enlargement/tenderness/nodules; no carotid   bruit or JVD  Back:    Spine nontender, no curvature, ROM normal, no CVA     tenderness  Lungs:     Clear to auscultation bilaterally without wheezes, rales or     ronchi; respirations unlabored  Chest Wall:    No tenderness or deformity   Heart:    Regular rate and rhythm, S1 and S2 normal, no murmur, rub   or gallop  Breast Exam:    No chest wall tenderness,  masses or gynecomastia  Abdomen:     Soft, non-tender, nondistended, normoactive bowel sounds,    no masses, no hepatosplenomegaly        Extremities:   No clubbing, cyanosis or edema  Pulses:   2+ and symmetric all extremities  Skin:   Skin color, texture, turgor normal, no rashes or lesions  Lymph nodes:   Cervical, supraclavicular, and axillary nodes normal  Neurologic:   CNII-XII intact, normal strength, sensation and gait; reflexes 2+ and symmetric throughout          Psych:   Normal mood, affect, hygiene and grooming.   Recent lab work was reviewed.       Assessment & Plan:  Routine general medical examination at a health care facility - Plan: Butalbital-APAP-Caffeine 50-300-40 MG CAPS  Glucose intolerance (impaired glucose tolerance)  Essential hypertension - Plan: Azilsartan-Chlorthalidone (EDARBYCLOR) 40-12.5 MG TABS  ACE-inhibitor cough  Erectile dysfunction, unspecified erectile dysfunction type - Plan: tadalafil (CIALIS) 20 MG tablet  Obstructive sleep apnea  Chronic allergic rhinitis  Hepatitis C antibody test positive continue on present medications. He does use butalbital for occasional headache following alcohol consumption.I will also switch him to [employer]. recheck here one month.

## 2015-06-26 NOTE — Patient Instructions (Signed)
Stop the amlodipine and the losartan

## 2015-07-01 ENCOUNTER — Other Ambulatory Visit: Payer: Self-pay

## 2015-07-01 DIAGNOSIS — G4733 Obstructive sleep apnea (adult) (pediatric): Secondary | ICD-10-CM

## 2015-07-08 ENCOUNTER — Telehealth: Payer: Self-pay | Admitting: Family Medicine

## 2015-07-08 NOTE — Telephone Encounter (Signed)
P.A. EDARBYCLOR  

## 2015-07-10 NOTE — Telephone Encounter (Signed)
P.A. Olivia Canter denied pt needs trial of at least 3 preferred meds, which are Benicar HCT, or any of the sartan's. So switched pt's Rx to [facility] where pt pays out of pocket cost #90 for $90 or #30 for $35. Pt informed, he prefers #30 to make sure it's working.  As of January [facility] is supposed to cover this per our Rep

## 2015-07-18 ENCOUNTER — Telehealth: Payer: Self-pay | Admitting: Family Medicine

## 2015-07-18 NOTE — Telephone Encounter (Signed)
Pt called and states he received denial letter and emailed copy to me.  Copy will be scanned in chart

## 2015-07-18 NOTE — Telephone Encounter (Signed)
  P.A. Olivia Canter denied pt needs trial of at least 3 preferred meds, which are Benicar HCT, or any of the sartan's. So switched pt's Rx to [facility] where pt pays out of pocket cost #90 for $90 or #30 for $35. Pt informed, he prefers #30 to make sure it's working. As of January [facility] is supposed to cover this per our Rep

## 2015-08-01 ENCOUNTER — Telehealth: Payer: Self-pay

## 2015-08-01 ENCOUNTER — Telehealth: Payer: Self-pay | Admitting: Neurology

## 2015-08-01 DIAGNOSIS — G4733 Obstructive sleep apnea (adult) (pediatric): Secondary | ICD-10-CM

## 2015-08-01 NOTE — Telephone Encounter (Signed)
Dr. Brett Fairy, this patient states that he spoke with you, and was told if he gets a referral he can bypass consult and go directly to sleep study. How would you like for me to handle this?  His cell phone is [phone].  Thanks, [employer]

## 2015-08-01 NOTE — Telephone Encounter (Signed)
Has a referral for sleep study at [facility], no appt set up. He says it will be the middle of January before they could see him @ 8:00pm and he doesn't want to do that. He says they are more flexible at [facility] and would like to be sent there instead.

## 2015-08-01 NOTE — Telephone Encounter (Signed)
Refer him over there.

## 2015-08-01 NOTE — Telephone Encounter (Signed)
I asked [name] to have Dr. Redmond School order a sleep study, I would like for him to have a home sleep test as a screening test for apnea. I will meet with him after the test results are back. He can have this home sleep test in January 2017. We need a note form dr Redmond School. Can you call Lal[facility] office and request a referral for sleep study?

## 2015-08-01 NOTE — Telephone Encounter (Signed)
Referral put in epic.  

## 2015-08-07 ENCOUNTER — Telehealth: Payer: Self-pay | Admitting: Neurology

## 2015-08-07 DIAGNOSIS — G4733 Obstructive sleep apnea (adult) (pediatric): Secondary | ICD-10-CM

## 2015-08-07 NOTE — Telephone Encounter (Signed)
Dr. Brett Fairy stated she would do a HST on the patient in January.  Could I get an order for the HST?  I will check insurance and get the patient scheduled for his test.

## 2015-08-07 NOTE — Telephone Encounter (Signed)
Ok to order an HST for January 2017 per Dr. Brett Fairy.

## 2015-08-10 ENCOUNTER — Other Ambulatory Visit: Payer: Self-pay | Admitting: Family Medicine

## 2015-08-13 NOTE — Telephone Encounter (Signed)
ok'ed per jcl to refill

## 2015-08-15 ENCOUNTER — Other Ambulatory Visit: Payer: Self-pay | Admitting: *Deleted

## 2015-08-15 ENCOUNTER — Other Ambulatory Visit: Payer: BLUE CROSS/BLUE SHIELD

## 2015-08-15 MED ORDER — AZILSARTAN-CHLORTHALIDONE 40-25 MG PO TABS: 1.0000 | ORAL_TABLET | Freq: Every day | ORAL | Status: DC

## 2015-08-15 NOTE — Telephone Encounter (Signed)
Patient came in for bp check, bp was 150/09. Dr Redmond School bumped his Edarbyclor 40/12.5mg  up to the 40/25mg  once daily. Patient was given samples #14 and rx was sent to [facility]. He will also return in 6 weeks for bp check.

## 2015-09-02 ENCOUNTER — Telehealth: Payer: Self-pay | Admitting: Family Medicine

## 2015-09-02 MED ORDER — PROMETHAZINE HCL 25 MG RE SUPP: 25.0000 mg | Freq: Four times a day (QID) | RECTAL | Status: DC | PRN

## 2015-09-02 NOTE — Telephone Encounter (Signed)
Pt's wife called and stated that pt is very sick. Can't keep anything down. He is throwing up and has diarrhea. She was offered an appt but states he can't come in because he can't stay out of the bathroom. She is requesting suppositories be sent in for him. [facility] [address]. Please call pt's wife and advise. Phone humber is [phone].

## 2015-09-02 NOTE — Telephone Encounter (Signed)
He is having difficulty with nausea, vomiting and diarrhea. I will call in Phenergan suppository.

## 2015-10-15 ENCOUNTER — Other Ambulatory Visit: Payer: Self-pay | Admitting: Family Medicine

## 2015-10-26 ENCOUNTER — Telehealth: Payer: Self-pay | Admitting: Family Medicine

## 2015-10-26 NOTE — Telephone Encounter (Signed)
P.A. CELECOXIB

## 2015-10-28 NOTE — Telephone Encounter (Signed)
P.A. Approved til 10/25/16, pt informed, faxed pharmacy

## 2015-11-21 ENCOUNTER — Encounter: Payer: Self-pay | Admitting: Family Medicine

## 2015-11-21 ENCOUNTER — Ambulatory Visit (INDEPENDENT_AMBULATORY_CARE_PROVIDER_SITE_OTHER): Payer: BLUE CROSS/BLUE SHIELD | Admitting: Family Medicine

## 2015-11-21 VITALS — BP 150/100 | HR 81 | Wt 215.0 lb

## 2015-11-21 DIAGNOSIS — I10 Essential (primary) hypertension: Secondary | ICD-10-CM

## 2015-11-21 DIAGNOSIS — J209 Acute bronchitis, unspecified: Secondary | ICD-10-CM

## 2015-11-21 MED ORDER — CLARITHROMYCIN 500 MG PO TABS: 500.0000 mg | ORAL_TABLET | Freq: Two times a day (BID) | ORAL | Status: DC

## 2015-11-21 MED ORDER — HYDROCOD POLST-CPM POLST ER 10-8 MG/5ML PO SUER: 5.0000 mL | Freq: Two times a day (BID) | ORAL | Status: DC | PRN

## 2015-11-21 NOTE — Progress Notes (Signed)
   Subjective:    Patient ID: Andrew Yang, male    DOB: [birth year], 65 y.o.   MRN: [id number]  HPI He complains of a two-week history of cough but no fever, chills, malaise, sore throat, ear congestion, shortness of breath. He also has underlying hypertension and states that his blood pressures at home are quite good.   Review of Systems     Objective:   Physical Exam Alert and in no distress. Tympanic membranes and canals are normal. Pharyngeal area is normal. Neck is supple without adenopathy or thyromegaly. Cardiac exam shows a regular sinus rhythm without murmurs or gallops. Lungs are clear to auscultation.        Assessment & Plan:  Acute bronchitis, unspecified organism - Plan: clarithromycin (BIAXIN) 500 MG tablet, chlorpheniramine-HYDROcodone (TUSSIONEX PENNKINETIC ER) 10-8 MG/5ML SUER  Essential hypertension I'll treat him as if this is an atypical bronchitis. I explained this to him. Also encouraged him to bring his blood pressure cuff in to measure it against ours as the readings here in the office are not adequate.

## 2015-11-21 NOTE — Patient Instructions (Signed)
Bring your blood pressure cuff in so we can measure it against ours

## 2015-12-27 ENCOUNTER — Other Ambulatory Visit: Payer: Self-pay | Admitting: Family Medicine

## 2015-12-30 ENCOUNTER — Ambulatory Visit (INDEPENDENT_AMBULATORY_CARE_PROVIDER_SITE_OTHER): Payer: BLUE CROSS/BLUE SHIELD | Admitting: Family Medicine

## 2015-12-30 ENCOUNTER — Encounter: Payer: Self-pay | Admitting: Family Medicine

## 2015-12-30 VITALS — BP 120/76 | HR 91 | Temp 99.9°F | Wt 216.0 lb

## 2015-12-30 DIAGNOSIS — J209 Acute bronchitis, unspecified: Secondary | ICD-10-CM | POA: Diagnosis not present

## 2015-12-30 MED ORDER — CLARITHROMYCIN 500 MG PO TABS: 500.0000 mg | ORAL_TABLET | Freq: Two times a day (BID) | ORAL | Status: DC

## 2015-12-30 MED ORDER — HYDROCOD POLST-CPM POLST ER 10-8 MG/5ML PO SUER: 5.0000 mL | Freq: Two times a day (BID) | ORAL | Status: DC | PRN

## 2015-12-30 NOTE — Progress Notes (Signed)
   Subjective:    Patient ID: Andrew Yang, male    DOB: Dec 11, 1950, 65 y.o.   MRN: [id number]  HPI  he is here for consultation concerning recurrence of a cough. He was treated approximately 5 weeks ago with Biaxin and did get back to normal however last week he developed cough, fever, malaise but no sore throat, earache , shortness of breath. He has been quite active socially doing traveling and playing golf which he thinks is worn him down.   Review of Systems     Objective:   Physical Exam Alert and in no distress. Tympanic membranes and canals are normal. Pharyngeal area is normal. Neck is supple without adenopathy or thyromegaly. Cardiac exam shows a regular sinus rhythm without murmurs or gallops. Lungs show scattered rhonchi        Assessment & Plan:  Acute bronchitis, unspecified organism - Plan: clarithromycin (BIAXIN) 500 MG tablet  I will treat him for 2 weeks. He will also be given Tussionex to help with cough.  he will call if continued difficulty.

## 2015-12-31 ENCOUNTER — Telehealth: Payer: Self-pay | Admitting: Family Medicine

## 2015-12-31 DIAGNOSIS — R0789 Other chest pain: Secondary | ICD-10-CM

## 2015-12-31 DIAGNOSIS — R059 Cough, unspecified: Secondary | ICD-10-CM

## 2015-12-31 DIAGNOSIS — R05 Cough: Secondary | ICD-10-CM

## 2015-12-31 MED ORDER — ALBUTEROL SULFATE 108 (90 BASE) MCG/ACT IN AEPB: 2.0000 | INHALATION_SPRAY | Freq: Four times a day (QID) | RESPIRATORY_TRACT | Status: DC | PRN

## 2015-12-31 NOTE — Telephone Encounter (Signed)
Let him know that I called it in 

## 2015-12-31 NOTE — Telephone Encounter (Signed)
Pt informed

## 2015-12-31 NOTE — Telephone Encounter (Signed)
Pt called and stated that as his appt yesterday it was discussed that he get a refill on a inhaler he had in the past. Per medications looks like it was albuterol. Pt is requesting that be sent in for him. Pt can be reached at [phone]. [facility].

## 2016-02-29 ENCOUNTER — Other Ambulatory Visit: Payer: Self-pay | Admitting: Family Medicine

## 2016-03-01 ENCOUNTER — Other Ambulatory Visit: Payer: Self-pay | Admitting: Family Medicine

## 2016-03-02 NOTE — Telephone Encounter (Signed)
I have called in med  

## 2016-03-02 NOTE — Telephone Encounter (Signed)
Is this okay to refill? 

## 2016-04-05 ENCOUNTER — Other Ambulatory Visit: Payer: Self-pay | Admitting: Family Medicine

## 2016-04-06 NOTE — Telephone Encounter (Signed)
Is this okay to refill? 

## 2016-06-20 ENCOUNTER — Other Ambulatory Visit: Payer: Self-pay | Admitting: Family Medicine

## 2016-07-21 ENCOUNTER — Other Ambulatory Visit (INDEPENDENT_AMBULATORY_CARE_PROVIDER_SITE_OTHER): Payer: Medicare HMO

## 2016-07-21 DIAGNOSIS — Z23 Encounter for immunization: Secondary | ICD-10-CM

## 2016-07-23 DIAGNOSIS — E7800 Pure hypercholesterolemia, unspecified: Secondary | ICD-10-CM | POA: Diagnosis not present

## 2016-07-23 DIAGNOSIS — Z6828 Body mass index (BMI) 28.0-28.9, adult: Secondary | ICD-10-CM | POA: Diagnosis not present

## 2016-07-23 DIAGNOSIS — I10 Essential (primary) hypertension: Secondary | ICD-10-CM | POA: Diagnosis not present

## 2016-07-23 DIAGNOSIS — R69 Illness, unspecified: Secondary | ICD-10-CM | POA: Diagnosis not present

## 2016-07-23 DIAGNOSIS — Z0000 Encounter for general adult medical examination without abnormal findings: Secondary | ICD-10-CM | POA: Diagnosis not present

## 2016-09-02 ENCOUNTER — Other Ambulatory Visit: Payer: Self-pay | Admitting: Family Medicine

## 2016-09-02 DIAGNOSIS — F341 Dysthymic disorder: Secondary | ICD-10-CM

## 2016-09-02 NOTE — Telephone Encounter (Signed)
Is this okay to refill? 

## 2016-09-22 ENCOUNTER — Ambulatory Visit (INDEPENDENT_AMBULATORY_CARE_PROVIDER_SITE_OTHER): Payer: Medicare HMO | Admitting: Family Medicine

## 2016-09-22 ENCOUNTER — Encounter: Payer: Self-pay | Admitting: Family Medicine

## 2016-09-22 VITALS — BP 120/80 | HR 73 | Temp 97.7°F | Wt 223.6 lb

## 2016-09-22 DIAGNOSIS — R6889 Other general symptoms and signs: Secondary | ICD-10-CM | POA: Diagnosis not present

## 2016-09-22 DIAGNOSIS — J3489 Other specified disorders of nose and nasal sinuses: Secondary | ICD-10-CM

## 2016-09-22 LAB — POC INFLUENZA A&B (BINAX/QUICKVUE)
INFLUENZA A, POC: POSITIVE — AB
Influenza B, POC: POSITIVE — AB

## 2016-09-22 NOTE — Patient Instructions (Signed)

## 2016-09-22 NOTE — Progress Notes (Signed)
Subjective: Chief Complaint  Patient presents with  . SICK    nasal, congestion, mucous turning green, family has had flu recently, taken otc but no relief, symptoms since sunday     Andrew Yang is a 66 y.o. male who presents for possible sinus infection.  Symptoms include a 3 day history of nasal congestion, thick purulent nasal drainage, dry cough, sore throat, and fatigue.  Denies fever, chills, body aches, chest pain, palpitations, shortness of breath, abdominal pain, N/V/D, LE edema.  Positive flu contacts.  He did get a flu shot.   Past history is significant for bronchitis and sinusitis . Patient is a former smoker.   Using theraflu  for symptoms. Positive sick contacts.  No other aggravating or relieving factors.  No other c/o.  ROS as in subjective   Objective: Vitals:   09/22/16 1443  BP: 120/80  Pulse: 73  Temp: 97.7 F (36.5 C)    General appearance: Alert, WD/WN, no distress                             Skin: warm, no rash                           Head: no sinus tenderness,                            Eyes: conjunctiva normal, corneas clear, PERRLA                            Ears: pearly TMs, external ear canals normal                          Nose: septum midline, turbinates swollen, with erythema and clear discharge             Mouth/throat: MMM, tongue normal, mild pharyngeal erythema                           Neck: supple, no adenopathy, no thyromegaly, nontender                          Heart: RRR, normal S1, S2, no murmurs                         Lungs: CTA bilaterally, no wheezes, rales, or rhonchi       Assessment and Plan: Flu-like symptoms - Plan: POC Influenza A&B(BINAX/QUICKVUE)  Purulent nasal discharge discussed that he is outside the window for Tamiflu.  Discussed influenza management and possible complications to be aware of.  Can use OTC Mucinex for congestion.  Tylenol or Ibuprofen OTC for fever and malaise.  Discussed symptomatic relief,  nasal saline flush, and call or return if worse or not improving in 2-3 days.

## 2016-09-23 ENCOUNTER — Telehealth: Payer: Self-pay

## 2016-09-23 NOTE — Telephone Encounter (Signed)
Pt's wife questions guidelines for flu. She reports that pt has the flu and is supposed to be around his 66 yr old mother in law this weekend. Andrew Yang questions how long pt is considered contagious. Informed her of the 24 hour fever free rule, but she states pt has not ran a fever. Victorino December  CB [id number]

## 2016-09-24 NOTE — Telephone Encounter (Signed)
I would have him avoid contact with others until his symptoms improve, especially anyone who is immunocompromised. You can ask Dr. Redmond School his opinion also please.

## 2016-09-24 NOTE — Telephone Encounter (Signed)
Please see below. Andrew Yang

## 2016-09-24 NOTE — Telephone Encounter (Signed)
He really does not need to be around anybody until he starts to feel better whether it's fever or just general feeling back to normal for at least 24 hours

## 2016-09-25 NOTE — Telephone Encounter (Signed)
Andrew Yang aware of recommendations. Andrew Yang

## 2016-10-05 ENCOUNTER — Telehealth: Payer: Self-pay

## 2016-10-05 NOTE — Telephone Encounter (Signed)
Check on this

## 2016-10-05 NOTE — Telephone Encounter (Signed)
Pt states that the [name] is too expensive. Andrew Yang. Rx is $220/ 30 days. [phone]

## 2016-10-05 NOTE — Telephone Encounter (Signed)
Left pt message as per [facility] for pt to come in so they can change his med due to medicare pt can not get discount

## 2016-10-06 ENCOUNTER — Telehealth: Payer: Self-pay | Admitting: Family Medicine

## 2016-10-06 NOTE — Telephone Encounter (Signed)
Pt made CPE appt on 10/26/16 pt is also coming in for an appt on this Thursday 10/08/16 and would like to do his CPE fasting bloodwork at that time. He want to make sure that his regular Hep C labs are checked at that time. Is this ok?

## 2016-10-06 NOTE — Telephone Encounter (Signed)
yes

## 2016-10-08 ENCOUNTER — Ambulatory Visit (INDEPENDENT_AMBULATORY_CARE_PROVIDER_SITE_OTHER): Payer: Medicare HMO | Admitting: Family Medicine

## 2016-10-08 ENCOUNTER — Encounter: Payer: Self-pay | Admitting: Family Medicine

## 2016-10-08 VITALS — BP 144/88 | HR 90 | Wt 227.0 lb

## 2016-10-08 DIAGNOSIS — R768 Other specified abnormal immunological findings in serum: Secondary | ICD-10-CM

## 2016-10-08 DIAGNOSIS — G4733 Obstructive sleep apnea (adult) (pediatric): Secondary | ICD-10-CM | POA: Diagnosis not present

## 2016-10-08 DIAGNOSIS — R05 Cough: Secondary | ICD-10-CM

## 2016-10-08 DIAGNOSIS — Z136 Encounter for screening for cardiovascular disorders: Secondary | ICD-10-CM | POA: Diagnosis not present

## 2016-10-08 DIAGNOSIS — T464X5A Adverse effect of angiotensin-converting-enzyme inhibitors, initial encounter: Secondary | ICD-10-CM | POA: Diagnosis not present

## 2016-10-08 DIAGNOSIS — I10 Essential (primary) hypertension: Secondary | ICD-10-CM

## 2016-10-08 DIAGNOSIS — R058 Other specified cough: Secondary | ICD-10-CM

## 2016-10-08 DIAGNOSIS — E785 Hyperlipidemia, unspecified: Secondary | ICD-10-CM

## 2016-10-08 DIAGNOSIS — Z125 Encounter for screening for malignant neoplasm of prostate: Secondary | ICD-10-CM | POA: Diagnosis not present

## 2016-10-08 DIAGNOSIS — H2513 Age-related nuclear cataract, bilateral: Secondary | ICD-10-CM | POA: Diagnosis not present

## 2016-10-08 DIAGNOSIS — Z122 Encounter for screening for malignant neoplasm of respiratory organs: Secondary | ICD-10-CM | POA: Diagnosis not present

## 2016-10-08 DIAGNOSIS — R7302 Impaired glucose tolerance (oral): Secondary | ICD-10-CM | POA: Diagnosis not present

## 2016-10-08 LAB — CBC WITH DIFFERENTIAL/PLATELET
BASOS PCT: 1 %
Basophils Absolute: 63 cells/uL (ref 0–200)
Eosinophils Absolute: 189 cells/uL (ref 15–500)
Eosinophils Relative: 3 %
HCT: 39 % (ref 38.5–50.0)
Hemoglobin: 13.2 g/dL (ref 13.2–17.1)
Lymphocytes Relative: 33 %
Lymphs Abs: 2079 cells/uL (ref 850–3900)
MCH: 29.9 pg (ref 27.0–33.0)
MCHC: 33.8 g/dL (ref 32.0–36.0)
MCV: 88.2 fL (ref 80.0–100.0)
MONOS PCT: 12 %
MPV: 10.6 fL (ref 7.5–12.5)
Monocytes Absolute: 756 cells/uL (ref 200–950)
NEUTROS PCT: 51 %
Neutro Abs: 3213 cells/uL (ref 1500–7800)
PLATELETS: 206 10*3/uL (ref 140–400)
RBC: 4.42 MIL/uL (ref 4.20–5.80)
RDW: 14 % (ref 11.0–15.0)
WBC: 6.3 10*3/uL (ref 4.0–10.5)

## 2016-10-08 LAB — COMPREHENSIVE METABOLIC PANEL
ALK PHOS: 49 U/L (ref 40–115)
ALT: 34 U/L (ref 9–46)
AST: 29 U/L (ref 10–35)
Albumin: 4.1 g/dL (ref 3.6–5.1)
BUN: 17 mg/dL (ref 7–25)
CALCIUM: 8.9 mg/dL (ref 8.6–10.3)
CO2: 24 mmol/L (ref 20–31)
Chloride: 99 mmol/L (ref 98–110)
Creat: 1.1 mg/dL (ref 0.70–1.25)
GLUCOSE: 102 mg/dL — AB (ref 65–99)
POTASSIUM: 4.1 mmol/L (ref 3.5–5.3)
Sodium: 133 mmol/L — ABNORMAL LOW (ref 135–146)
Total Bilirubin: 0.5 mg/dL (ref 0.2–1.2)
Total Protein: 6.7 g/dL (ref 6.1–8.1)

## 2016-10-08 LAB — PSA: PSA: 0.4 ng/mL (ref <=4.0)

## 2016-10-08 LAB — LIPID PANEL
CHOL/HDL RATIO: 5.3 ratio — AB (ref <5.0)
Cholesterol: 205 mg/dL — ABNORMAL HIGH (ref <200)
HDL: 39 mg/dL — AB (ref >40)
LDL CALC: 99 mg/dL (ref <100)
Triglycerides: 334 mg/dL — ABNORMAL HIGH (ref <150)
VLDL: 67 mg/dL — AB (ref <30)

## 2016-10-08 MED ORDER — LOSARTAN POTASSIUM-HCTZ 100-12.5 MG PO TABS: 1.0000 | ORAL_TABLET | Freq: Every day | ORAL | 3 refills | Status: DC

## 2016-10-08 MED ORDER — ROSUVASTATIN CALCIUM 20 MG PO TABS: ORAL_TABLET | ORAL | 3 refills | Status: DC

## 2016-10-08 MED ORDER — AMLODIPINE BESYLATE 10 MG PO TABS: 10.0000 mg | ORAL_TABLET | Freq: Every day | ORAL | 3 refills | Status: DC

## 2016-10-08 NOTE — Progress Notes (Signed)
   Subjective:    Patient ID: Andrew Yang, male    DOB: 06-19-1951, 66 y.o.   MRN: [id number]  HPI He is here for an interval evaluation. He can no longer take Edarbyclor due to now being on Medicare and will need to be switched to a different medication. He does have an Ace cough. He does have a previous history of difficulty with glucose intolerance. He also has a history of OSA but has not followed up and on the sleep study that apparently is now needed. He has been dragging his feet in that regard. He also has tested positive for hepatitis C but has had a negative viral load. Continues on Crestor for his hyperlipidemia. He also has a 30+ Pack year history of smoking and quit last year. Presently he is having no pulmonary symptoms. He would also like to be PSA tested. He is scheduled to discuss colonoscopy with Dr. Earlean Shawl. Apparently he is on a 5 year schedule. I have no documentation of that.   Review of Systems     Objective:   Physical Exam Alert and in no distress otherwise not examined       Assessment & Plan:  Essential hypertension - Plan: losartan-hydrochlorothiazide (HYZAAR) 100-12.5 MG tablet, amLODipine (NORVASC) 10 MG tablet  Glucose intolerance (impaired glucose tolerance) - Plan: CBC with Differential/Platelet, Comprehensive metabolic panel, Lipid panel  OSA (obstructive sleep apnea) - Plan: Nocturnal polysomnography (NPSG)  ACE-inhibitor cough  Hepatitis C antibody test positive - Plan: Hepatitis c vrs RNA detect by PCR-qual  Hyperlipidemia with target low density lipoprotein (LDL) cholesterol less than 100 mg/dL - Plan: Lipid panel, rosuvastatin (CRESTOR) 20 MG tablet  Encounter for screening for lung cancer - Plan: CT CHEST LUNG CA SCREEN LOW DOSE W/O CM, CANCELED: CT CHEST LIMITED WO CONTRAST  Screening for prostate cancer - Plan: PSA  Screening for AAA (abdominal aortic aneurysm) - Plan: US ABDOMINAL AORTA SCREENING AAA He will start back on his previous  antihypertensive medication and recheck here in one month for complete exam. Routine screening done for the above-mentioned issues. I discussed the limited CT scanning in terms of risks and benefits. He is aware of that. So discussed the need for him to get the shingles vaccine when it comes available.

## 2016-10-11 LAB — HEPATITIS C VRS RNA DETECT BY PCR-QUAL: Hepatitis C Vrs RNA by PCR-Qual: NOT DETECTED

## 2016-10-20 ENCOUNTER — Ambulatory Visit: Payer: Self-pay

## 2016-10-22 ENCOUNTER — Ambulatory Visit
Admission: RE | Admit: 2016-10-22 | Discharge: 2016-10-22 | Disposition: A | Discharge status: Home / Self Care | Payer: Medicare HMO | Source: Ambulatory Visit | Attending: Family Medicine | Admitting: Family Medicine

## 2016-10-22 DIAGNOSIS — Z87891 Personal history of nicotine dependence: Secondary | ICD-10-CM | POA: Diagnosis not present

## 2016-10-22 DIAGNOSIS — Z136 Encounter for screening for cardiovascular disorders: Secondary | ICD-10-CM | POA: Diagnosis not present

## 2016-10-22 DIAGNOSIS — R69 Illness, unspecified: Secondary | ICD-10-CM | POA: Diagnosis not present

## 2016-10-22 DIAGNOSIS — Z122 Encounter for screening for malignant neoplasm of respiratory organs: Secondary | ICD-10-CM

## 2016-10-26 ENCOUNTER — Telehealth: Payer: Self-pay | Admitting: Family Medicine

## 2016-10-26 ENCOUNTER — Ambulatory Visit (INDEPENDENT_AMBULATORY_CARE_PROVIDER_SITE_OTHER): Payer: Medicare HMO | Admitting: Family Medicine

## 2016-10-26 ENCOUNTER — Encounter: Payer: Self-pay | Admitting: Family Medicine

## 2016-10-26 VITALS — BP 138/80 | HR 82 | Resp 16 | Ht 70.5 in | Wt 228.4 lb

## 2016-10-26 DIAGNOSIS — E785 Hyperlipidemia, unspecified: Secondary | ICD-10-CM

## 2016-10-26 DIAGNOSIS — R7302 Impaired glucose tolerance (oral): Secondary | ICD-10-CM

## 2016-10-26 DIAGNOSIS — G4733 Obstructive sleep apnea (adult) (pediatric): Secondary | ICD-10-CM

## 2016-10-26 DIAGNOSIS — I7090 Unspecified atherosclerosis: Secondary | ICD-10-CM

## 2016-10-26 DIAGNOSIS — I10 Essential (primary) hypertension: Secondary | ICD-10-CM | POA: Diagnosis not present

## 2016-10-26 DIAGNOSIS — M549 Dorsalgia, unspecified: Secondary | ICD-10-CM | POA: Diagnosis not present

## 2016-10-26 DIAGNOSIS — Z23 Encounter for immunization: Secondary | ICD-10-CM

## 2016-10-26 DIAGNOSIS — J309 Allergic rhinitis, unspecified: Secondary | ICD-10-CM

## 2016-10-26 DIAGNOSIS — G8929 Other chronic pain: Secondary | ICD-10-CM

## 2016-10-26 DIAGNOSIS — N529 Male erectile dysfunction, unspecified: Secondary | ICD-10-CM

## 2016-10-26 MED ORDER — ROSUVASTATIN CALCIUM 40 MG PO TABS: 40.0000 mg | ORAL_TABLET | Freq: Every day | ORAL | 3 refills | Status: DC

## 2016-10-26 NOTE — Telephone Encounter (Signed)
Pt said he call Dr Gwenlyn Found, cardiologist , himself to make an appointment even though Dr Redmond School told him that he would be working on a referral. That office can not see him until 11/05/16 with a PA. Is Dr Redmond School able to get patient seen sooner at that office? If not, is there another office that patient can possible be seen sooner? He does not want to wait given the issue that he is dealing with.

## 2016-10-26 NOTE — Progress Notes (Signed)
Andrew Yang is a 66 y.o. male who presents for IPPE visit and follow-up on chronic medical conditions.  He has the following concerns: He recently had a CT scan of his chest for lung cancer screening which was negative except for atherosclerosis it did show up in the left main and LAD. He is not having any chest pain, shortness of breath, PND or DOE. He also has OSA but has not followed up with getting further evaluated and treatment. He plans to do this. He also will set up to have a colonoscopy although he had one in 2012 he would like to get another one. He does have a history of glucose intolerance. His allergies seem to be under good control. He does have generalized aches and pains and uses Celebrex with good results. He continues on Effexor for treatment of his dysthymia. He also is taking Crestor 20 mg. Presently he is on amlodipine and losartan. In the past he was on Edarbyclor but his insurance will not cover that. He does occasionally use Cialis. His work and home life are going well.   Immunizations and Health Maintenance Immunization History  Administered Date(s) Administered  . Influenza Whole 07/25/2007, 04/09/2011  . Influenza, High Dose Seasonal PF 07/21/2016  . Influenza, Seasonal, Injecte, Preservative Fre 07/26/2012  . Influenza,inj,Quad PF,36+ Mos 04/17/2014, 06/13/2015  . Td 01/05/1984, 05/16/2004  . Tdap 07/26/2012  . Zoster 07/26/2012   Health Maintenance Due  Topic Date Due  . HIV Screening  05/06/1966  . PNA vac Low Risk Adult (1 of 2 - PCV13) 05/06/2016    Last colonoscopy: 2012 Last PSA: 10/2016 Dentist:  Dr Andrew Yang: Andrew Yang Exercise: 2-3 times per week  Other doctors caring for patient include: Andrew Yang- GI    Advanced Directives:  Yes     Depression screen:  See questionnaire below.     Depression screen [facility] 2/9 10/26/2016 06/26/2015  Decreased Interest 0 0  Down, Depressed, Hopeless 0 0  PHQ - 2 Score 0 0    Fall Screen: See Questionaire  below.   Fall Risk  10/26/2016 06/26/2015  Falls in the past year? No No    ADL screen:  See questionnaire below.  Functional Status Survey:   Review of Systems Negative except as above PHYSICAL EXAM:  BP 138/80   Pulse 82   Resp 16   Ht 5' 10.5" (1.791 m)   Wt 228 lb 6.4 oz (103.6 kg)   SpO2 99%   BMI 32.31 kg/m   General Appearance: Alert, cooperative, no distress, appears stated age Head: Normocephalic, without obvious abnormality, atraumatic Eyes: PERRL, conjunctiva/corneas clear, EOM's intact, fundi benign Ears: Normal TM's and external ear canals Nose: Nares normal, mucosa normal, no drainage or sinus   tenderness Throat: Lips, mucosa, and tongue normal; teeth and gums normal Neck: Supple, no lymphadenopathy, thyroid:no enlargement/tenderness/nodules; no carotid bruit or JVD Lungs: Clear to auscultation bilaterally without wheezes, rales or ronchi; respirations unlabored Heart: Regular rate and rhythm, S1 and S2 normal, no murmur, rub or gallop Abdomen: Soft, non-tender, nondistended, normoactive bowel sounds, no masses, no hepatosplenomegaly Extremities: No clubbing, cyanosis or edema Pulses: 2+ and symmetric all extremities Skin: Skin color, texture, turgor normal, no rashes or lesions Lymph nodes: Cervical, supraclavicular, and axillary nodes normal Neurologic: CNII-XII intact, normal strength, sensation and gait; reflexes 2+ and symmetric throughout   Psych: Normal mood, affect, hygiene and grooming EKG shows no acute changes ASSESSMENT/PLAN: Atherosclerosis - Plan: EKG 12-Lead  Need for vaccination with 13-polyvalent  pneumococcal conjugate vaccine - Plan: Pneumococcal conjugate vaccine 13-valent  Essential hypertension  Glucose intolerance (impaired glucose tolerance)  OSA (obstructive sleep apnea)  Hyperlipidemia with target low density lipoprotein (LDL) cholesterol less than 100 mg/dL  Chronic allergic rhinitis, unspecified seasonality, unspecified  trigger  Chronic back pain, unspecified back location, unspecified back pain laterality  Obstructive sleep apnea  Erectile dysfunction, unspecified erectile dysfunction type He will be referred to cardiology for further evaluation of his atherosclerotic lesions. Immunizations were updated. He will continue on his present blood pressure medications but this may change depending upon the cardiac workup. Discussed glucose intolerance with him in regard to diet and exercise as well as general health and his underlying cardiac condition. He will follow-up with getting the sleep study done. Continue on Celebrex which she is taking 1 usually on a daily basis. Continue to use Cialis as needed. recommended at least 30 minutes of aerobic activity at least 5 days/week; healthy diet and alcohol recommendations (less than or equal to 2 drinks/day) reviewed;  Immunization recommendations discussed.  Colonoscopy recommendations reviewed.   Medicare Attestation I have personally reviewed: The patient's medical and social history Their use of alcohol, tobacco or illicit drugs Their current medications and supplements The patient's functional ability including ADLs,fall risks, home safety risks, cognitive, and hearing and visual impairment Diet and physical activities Evidence for depression or mood disorders  The patient's weight, height, and BMI have been recorded in the chart.  I have made referrals, counseling, and provided education to the patient based on review of the above and I have provided the patient with a written personalized care plan for preventive services.     [name], MD   10/26/2016

## 2016-10-26 NOTE — Patient Instructions (Signed)
  Mr. Yeargan , Thank you for taking time to come for your  first Medicare Visit. I appreciate your ongoing commitment to your health goals. Please review the following plan we discussed and let me know if I can assist you in the future.   This is a list of the screening recommended for you and due dates:  Health Maintenance  Topic Date Due  . HIV Screening  05/06/1966  . Pneumonia vaccines (1 of 2 - PCV13) 05/06/2016  . Colon Cancer Screening  04/10/2021  . Tetanus Vaccine  07/26/2022  . Flu Shot  Completed  .  Hepatitis C: One time screening is recommended by Center for Disease Control  (CDC) for  adults born from [birth year] through 1965.   Completed

## 2016-10-28 DIAGNOSIS — H2510 Age-related nuclear cataract, unspecified eye: Secondary | ICD-10-CM | POA: Diagnosis not present

## 2016-11-03 ENCOUNTER — Encounter (HOSPITAL_COMMUNITY): Payer: Self-pay | Admitting: Internal Medicine

## 2016-11-03 ENCOUNTER — Ambulatory Visit (HOSPITAL_COMMUNITY)
Admission: RE | Admit: 2016-11-03 | Discharge: 2016-11-03 | Disposition: A | Discharge status: Home / Self Care | Payer: Medicare HMO | Source: Ambulatory Visit | Attending: Internal Medicine | Admitting: Internal Medicine

## 2016-11-03 VITALS — BP 140/74 | HR 85 | Wt 227.8 lb

## 2016-11-03 DIAGNOSIS — Z8249 Family history of ischemic heart disease and other diseases of the circulatory system: Secondary | ICD-10-CM | POA: Insufficient documentation

## 2016-11-03 DIAGNOSIS — Z79899 Other long term (current) drug therapy: Secondary | ICD-10-CM | POA: Diagnosis not present

## 2016-11-03 DIAGNOSIS — Z87891 Personal history of nicotine dependence: Secondary | ICD-10-CM | POA: Insufficient documentation

## 2016-11-03 DIAGNOSIS — Z888 Allergy status to other drugs, medicaments and biological substances status: Secondary | ICD-10-CM | POA: Diagnosis not present

## 2016-11-03 DIAGNOSIS — E785 Hyperlipidemia, unspecified: Secondary | ICD-10-CM | POA: Diagnosis not present

## 2016-11-03 DIAGNOSIS — G4733 Obstructive sleep apnea (adult) (pediatric): Secondary | ICD-10-CM | POA: Insufficient documentation

## 2016-11-03 DIAGNOSIS — Z8261 Family history of arthritis: Secondary | ICD-10-CM | POA: Diagnosis not present

## 2016-11-03 DIAGNOSIS — I2510 Atherosclerotic heart disease of native coronary artery without angina pectoris: Secondary | ICD-10-CM | POA: Diagnosis not present

## 2016-11-03 DIAGNOSIS — I10 Essential (primary) hypertension: Secondary | ICD-10-CM | POA: Insufficient documentation

## 2016-11-03 DIAGNOSIS — B1920 Unspecified viral hepatitis C without hepatic coma: Secondary | ICD-10-CM | POA: Diagnosis not present

## 2016-11-03 NOTE — Patient Instructions (Signed)
Your physician has requested that you have cardiac CT. Cardiac computed tomography (CT) is a painless test that uses an x-ray machine to take clear, detailed pictures of your heart. For further information please visit www.cardiosmart.org. Please follow instruction sheet as given.   

## 2016-11-03 NOTE — Progress Notes (Signed)
[facility] Consult Note   Referring Physician: Dr. 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 Referred by Dr. Redmond Yang for further evaluation of coronary calcifications on routine CT.  He saw his PCP in March 2018 and a low-dose chest CT was ordered for routine lung cancer screening as he has a history of smoking. Chest CT on 10/22/16 without evidence of lung cancer, but did not some left main and LAD calcifications.   He denies chest pain with exertion. He works out 2 times a week with a Clinical research associate and does so without any chest pain or discomfort. He plays golf at least 2 times a week when the weather is nice. He does not have any SOB with activity or chest pressure. Was a heavy smoker for 30 years, quit last year. His father did not have a history of heart disease, his maternal grandfather died of a MI in his 19's, both were smokers.   EKG shows NSR without any acute ST segment abnormalities. He has felt slightly more fatigued lately, but is still able to preform his normal activities.   Review of Systems: [y] = yes, [ ]  = no   General: Weight gain [ ] ; Weight loss [ ] ; Anorexia [ ] ; Fatigue Blue.Reese ]; Fever [ ] ; Chills [ ] ; Weakness [ ]   Cardiac: Chest pain/pressure [ ] ; Resting SOB [ ] ; Exertional SOB [ ] ; Orthopnea [ ] ; Pedal Edema [ ] ; Palpitations [ ] ; Syncope [ ] ; Presyncope [ ] ; Paroxysmal nocturnal dyspnea[ ]   Pulmonary: Cough [ ] ; Wheezing[ ] ; Hemoptysis[ ] ; Sputum [ ] ; Snoring [ ]   GI: Vomiting[ ] ; Dysphagia[ ] ; Melena[ ] ; Hematochezia [ ] ; Heartburn[ ] ; Abdominal pain [ ] ; Constipation [ ] ; Diarrhea [ ] ; BRBPR [ ]   GU: Hematuria[ ] ; Dysuria [ ] ; Nocturia[ ]   Vascular: Pain in legs with walking [ ] ; Pain in feet with lying flat [ ] ; Non-healing sores [ ] ; Stroke [ ] ; TIA [ ] ; Slurred speech [ ] ;  Neuro: Headaches[ ] ;  Vertigo[ ] ; Seizures[ ] ; Paresthesias[ ] ;Blurred vision [ ] ; Diplopia [ ] ; Vision changes [ ]   Ortho/Skin: Arthritis Blue.Reese ]; Joint pain [ ] ; Muscle pain [ ] ; Joint swelling [ ] ; Back Pain [ ] ; Rash [ ]   Psych: Depression[ ] ; Anxiety[ ]   Heme: Bleeding problems [ ] ; Clotting disorders [ ] ; Anemia [ ]   Endocrine: Diabetes [ ] ; Thyroid dysfunction[ ]    Past Medical History:  Diagnosis Date  . Allergy    RHINITIS  . Colonic polyp   . Diverticulosis   . Dyslipidemia   . Hemorrhoids   . Hepatitis C   . Sleep apnea   . Smoker     Current Outpatient Prescriptions  Medication Sig Dispense Refill  . Albuterol Sulfate (PROAIR RESPICLICK) 782 (90 Base) MCG/ACT AEPB Inhale 2 puffs into the lungs 4 (four) times daily as needed. 1 each 0  . amLODipine (NORVASC) 10 MG tablet Take 1 tablet (10 mg total) by mouth daily. 90 tablet 3  . Butalbital-APAP-Caffeine 50-300-40 MG CAPS Take 50 mg by mouth as needed. 30 capsule 2  . celecoxib (CELEBREX) 200 MG capsule TAKE 1 CAPSULE (200 MG TOTAL) BY MOUTH 2 (TWO) TIMES DAILY. 30 capsule 5  . losartan-hydrochlorothiazide (HYZAAR) 100-12.5 MG tablet Take 1 tablet by mouth daily. 90 tablet 3  . metaxalone (  SKELAXIN) 800 MG tablet TAKE 1 TABLET BY MOUTH AS NEEDED 90 tablet 0  . rosuvastatin (CRESTOR) 40 MG tablet Take 1 tablet (40 mg total) by mouth daily. 90 tablet 3  . tadalafil (CIALIS) 20 MG tablet Take 1 tablet (20 mg total) by mouth daily as needed for erectile dysfunction. 10 tablet 11  . venlafaxine (EFFEXOR) 75 MG tablet TAKE 1 TABLET BY MOUTH TWICE A DAY 180 tablet 1   No current facility-administered medications for this encounter.     Allergies  Allergen Reactions  . Lisinopril Cough      Social History   Social History  . Marital status: Married    Spouse name: N/A  . Number of children: N/A  . Years of education: N/A   Occupational History  . Not on file.   Social History Main Topics  . Smoking status: Former Smoker    Quit date:  10/27/2010  . Smokeless tobacco: Never Used  . Alcohol use 12.0 oz/week    20 Cans of beer per week  . Drug use: No  . Sexual activity: Yes   Other Topics Concern  . Not on file   Social History Narrative  . No narrative on file      Family History  Problem Relation Age of Onset  . Arthritis Mother   . Hypertension Father   . Heart disease Maternal Grandfather     Vitals:   11/03/16 0949  BP: 140/74  Pulse: 85  SpO2: 99%  Weight: 227 lb 12 oz (103.3 kg)     PHYSICAL EXAM: General:  Well appearing male, NAD.  HEENT: normal, atraumatic  Neck: supple. no JVD. Carotids 2+ bilat; no bruits. No lymphadenopathy or thyromegaly appreciated. Cor: PMI nondisplaced. Regular rate & rhythm. No rubs, gallops or murmurs. Lungs: clear to auscultation bilaterally. No wheezing Abdomen: soft, nontender, nondistended. No hepatosplenomegaly. No bruits or masses. Good bowel sounds. Extremities: no cyanosis, clubbing, rash, edema Warm. Good perfusion  Neuro: alert & oriented x 3, cranial nerves grossly intact. moves all 4 extremities w/o difficulty. Affect pleasant.  ECG: NSR, no acute ST/T wave changes. Personally reviewed (DB)    ASSESSMENT & PLAN: 1. Coronary atherosclerosis on chest CT: Calcification seen in left main and LAD. He denies symptoms of ACS, does have risk factors for ACS including age, tobacco abuse, HTN, and HLD.  - Recommend coronary chest CT with calcium scoring. Will schedule.   2. HLD: LDL is 99 - Continue high intensity statin 3. Tobacco abuse - Congratulated him on his continued cessation  4. HTN: Slightly elevated today - Continue amlodipine 10mg  daily + losartan/hctz.  - has been intolerant to ACE-I in the past.  5. History of OSA:  - Plans to schedule sleep study with his PCP.   Arbutus Leas, NP 10:19 AM  Patient seen and examined with Jettie Booze, NP. We discussed all aspects of the encounter. I agree with the assessment and plan as stated above.   66  y/o smoke with strong FHx for CAD referred for further evaluation of LM and LAD calcifications noted on chest CT. Currently asymptomatic except for some exertional fatigue.   We discussed options of 1) watchful waiting and RF management, 2) ETT 3) Cardiac cath or 4) CTA of coronaries. At first, he felt strongly about pursuing cath but after discussing the options thoroughly he would like to proceed with coronary CTA. We will arrange. Discussed need for aggressive RF management including smoking cessation and continuation of high-potency  statin. Can also consider ECASA 81mg  daily. BP mildly elevated. Will follow with PCP. Can add doxazosin as needed.   Glori Bickers, MD  7:18 PM

## 2016-11-04 DIAGNOSIS — L821 Other seborrheic keratosis: Secondary | ICD-10-CM | POA: Diagnosis not present

## 2016-11-04 DIAGNOSIS — D485 Neoplasm of uncertain behavior of skin: Secondary | ICD-10-CM | POA: Diagnosis not present

## 2016-11-04 DIAGNOSIS — D0462 Carcinoma in situ of skin of left upper limb, including shoulder: Secondary | ICD-10-CM | POA: Diagnosis not present

## 2016-11-04 DIAGNOSIS — Z85828 Personal history of other malignant neoplasm of skin: Secondary | ICD-10-CM | POA: Diagnosis not present

## 2016-11-04 DIAGNOSIS — L570 Actinic keratosis: Secondary | ICD-10-CM | POA: Diagnosis not present

## 2016-11-04 DIAGNOSIS — D1801 Hemangioma of skin and subcutaneous tissue: Secondary | ICD-10-CM | POA: Diagnosis not present

## 2016-11-04 DIAGNOSIS — C44622 Squamous cell carcinoma of skin of right upper limb, including shoulder: Secondary | ICD-10-CM | POA: Diagnosis not present

## 2016-11-04 DIAGNOSIS — L820 Inflamed seborrheic keratosis: Secondary | ICD-10-CM | POA: Diagnosis not present

## 2016-11-05 ENCOUNTER — Ambulatory Visit: Payer: Medicare HMO | Admitting: Cardiology

## 2016-11-05 ENCOUNTER — Ambulatory Visit (HOSPITAL_COMMUNITY): Payer: Medicare HMO

## 2016-11-06 ENCOUNTER — Other Ambulatory Visit: Payer: Self-pay | Admitting: Family Medicine

## 2016-11-09 NOTE — Telephone Encounter (Signed)
Is this okay to refill? 

## 2016-11-13 ENCOUNTER — Telehealth (HOSPITAL_COMMUNITY): Payer: Self-pay | Admitting: *Deleted

## 2016-11-13 DIAGNOSIS — I209 Angina pectoris, unspecified: Secondary | ICD-10-CM

## 2016-11-13 NOTE — Telephone Encounter (Signed)
Patient called in saying that his cardiac CT was denied by insurance.  I discussed with Dr. Haroldine Laws and has ordered patient to have a treadmill stress test.  Order has been placed and I will send to scheduling to schedule stress test.

## 2016-11-16 ENCOUNTER — Telehealth (HOSPITAL_COMMUNITY): Payer: Self-pay | Admitting: *Deleted

## 2016-11-16 DIAGNOSIS — I209 Angina pectoris, unspecified: Secondary | ICD-10-CM

## 2016-11-16 NOTE — Telephone Encounter (Signed)
Opened in error

## 2016-11-17 ENCOUNTER — Telehealth (HOSPITAL_COMMUNITY): Payer: Self-pay | Admitting: *Deleted

## 2016-11-17 NOTE — Telephone Encounter (Signed)
Patient given detailed instructions per Myocardial Perfusion Study Information Sheet for the test on  11/17/16. Patient notified to arrive 15 minutes early and that it is imperative to arrive on time for appointment to keep from having the test rescheduled.  If you need to cancel or reschedule your appointment, please call the office within 24 hours of your appointment. Failure to do so may result in a cancellation of your appointment, and a $50 no show fee. Patient verbalized understanding. Andrew Yang    

## 2016-11-18 ENCOUNTER — Ambulatory Visit (HOSPITAL_COMMUNITY): Payer: Medicare HMO | Attending: Cardiology

## 2016-11-18 DIAGNOSIS — I209 Angina pectoris, unspecified: Secondary | ICD-10-CM | POA: Insufficient documentation

## 2016-11-18 LAB — MYOCARDIAL PERFUSION IMAGING
CHL CUP MPHR: 155 {beats}/min
CSEPEDS: 15 s
CSEPPHR: 146 {beats}/min
Estimated workload: 12.1 METS
Exercise duration (min): 10 min
LV dias vol: 104 mL (ref 62–150)
LVSYSVOL: 39 mL
NUC STRESS TID: 0.98
Percent HR: 94 %
RATE: 0.29
Rest HR: 67 {beats}/min
SDS: 2
SRS: 3
SSS: 5

## 2016-11-18 MED ORDER — TECHNETIUM TC 99M TETROFOSMIN IV KIT
32.5000 | PACK | Freq: Once | INTRAVENOUS | Status: AC | PRN
  Administered 2016-11-18: 32.5 via INTRAVENOUS
  Filled 2016-11-18: qty 33

## 2016-11-18 MED ORDER — TECHNETIUM TC 99M TETROFOSMIN IV KIT
10.8000 | PACK | Freq: Once | INTRAVENOUS | Status: AC | PRN
  Administered 2016-11-18: 10.8 via INTRAVENOUS
  Filled 2016-11-18: qty 11

## 2016-12-02 ENCOUNTER — Other Ambulatory Visit: Payer: Self-pay

## 2016-12-02 ENCOUNTER — Telehealth: Payer: Self-pay | Admitting: Family Medicine

## 2016-12-02 DIAGNOSIS — I7090 Unspecified atherosclerosis: Secondary | ICD-10-CM

## 2016-12-02 DIAGNOSIS — E785 Hyperlipidemia, unspecified: Secondary | ICD-10-CM

## 2016-12-02 MED ORDER — ROSUVASTATIN CALCIUM 40 MG PO TABS: 40.0000 mg | ORAL_TABLET | Freq: Every day | ORAL | 3 refills | Status: DC

## 2016-12-02 NOTE — Telephone Encounter (Signed)
Pt is ready to pick up his new increased dose of Crestor 40 but it should have been sent to [facility] at [facility]. Pt wants [facility] removed from his record completely.

## 2016-12-02 NOTE — Telephone Encounter (Signed)
[facility] take out [place]

## 2016-12-22 ENCOUNTER — Ambulatory Visit: Payer: Medicare HMO | Admitting: Family Medicine

## 2016-12-24 ENCOUNTER — Ambulatory Visit (INDEPENDENT_AMBULATORY_CARE_PROVIDER_SITE_OTHER): Payer: Medicare HMO | Admitting: Family Medicine

## 2016-12-24 ENCOUNTER — Encounter: Payer: Self-pay | Admitting: Family Medicine

## 2016-12-24 VITALS — BP 122/82 | HR 74 | Ht 71.0 in | Wt 228.0 lb

## 2016-12-24 DIAGNOSIS — G4733 Obstructive sleep apnea (adult) (pediatric): Secondary | ICD-10-CM

## 2016-12-24 DIAGNOSIS — I10 Essential (primary) hypertension: Secondary | ICD-10-CM | POA: Diagnosis not present

## 2016-12-24 NOTE — Progress Notes (Signed)
   Subjective:    Patient ID: Andrew Yang, male    DOB: Dec 17, 1950, 66 y.o.   MRN: [id number]  HPI He is here for consultation concerning difficulty with peripheral edema. He notes that now he is on amlodipine 10 mg, he has had difficulty with increasing swelling and pain in his lower extremities. He also has a history of OSA but has not been on CPAP stating difficulty getting 1 work properly.   Review of Systems     Objective:   Physical Exam Alert and in no distress otherwise not examined       Assessment & Plan:  Obstructive sleep apnea  Essential hypertension I will have him stop the amlodipine as he is having unacceptable side effects. [facility] to [facility] to [facility] a machine set at auto titrate and then adjust accordingly. Hopefully this will help with his OSA symptoms/hypertension.

## 2016-12-25 ENCOUNTER — Other Ambulatory Visit: Payer: Self-pay

## 2016-12-25 DIAGNOSIS — G4733 Obstructive sleep apnea (adult) (pediatric): Secondary | ICD-10-CM

## 2016-12-28 ENCOUNTER — Other Ambulatory Visit: Payer: Self-pay

## 2016-12-28 DIAGNOSIS — G4733 Obstructive sleep apnea (adult) (pediatric): Secondary | ICD-10-CM

## 2017-01-14 ENCOUNTER — Other Ambulatory Visit: Payer: Self-pay

## 2017-01-14 DIAGNOSIS — G4733 Obstructive sleep apnea (adult) (pediatric): Secondary | ICD-10-CM

## 2017-01-18 DIAGNOSIS — H2513 Age-related nuclear cataract, bilateral: Secondary | ICD-10-CM | POA: Diagnosis not present

## 2017-01-18 DIAGNOSIS — H25013 Cortical age-related cataract, bilateral: Secondary | ICD-10-CM | POA: Diagnosis not present

## 2017-02-17 DIAGNOSIS — H2511 Age-related nuclear cataract, right eye: Secondary | ICD-10-CM | POA: Diagnosis not present

## 2017-03-01 DIAGNOSIS — H2512 Age-related nuclear cataract, left eye: Secondary | ICD-10-CM | POA: Diagnosis not present

## 2017-03-02 ENCOUNTER — Ambulatory Visit (INDEPENDENT_AMBULATORY_CARE_PROVIDER_SITE_OTHER): Payer: Medicare HMO | Admitting: Neurology

## 2017-03-02 ENCOUNTER — Encounter: Payer: Self-pay | Admitting: Neurology

## 2017-03-02 VITALS — BP 145/70 | HR 70 | Ht 73.0 in | Wt 223.5 lb

## 2017-03-02 DIAGNOSIS — F519 Sleep disorder not due to a substance or known physiological condition, unspecified: Secondary | ICD-10-CM | POA: Insufficient documentation

## 2017-03-02 DIAGNOSIS — Z0000 Encounter for general adult medical examination without abnormal findings: Secondary | ICD-10-CM | POA: Diagnosis not present

## 2017-03-02 DIAGNOSIS — R69 Illness, unspecified: Secondary | ICD-10-CM | POA: Diagnosis not present

## 2017-03-02 DIAGNOSIS — G4730 Sleep apnea, unspecified: Secondary | ICD-10-CM | POA: Diagnosis not present

## 2017-03-02 DIAGNOSIS — Z6828 Body mass index (BMI) 28.0-28.9, adult: Secondary | ICD-10-CM | POA: Diagnosis not present

## 2017-03-02 DIAGNOSIS — R0683 Snoring: Secondary | ICD-10-CM | POA: Diagnosis not present

## 2017-03-02 DIAGNOSIS — G4710 Hypersomnia, unspecified: Secondary | ICD-10-CM | POA: Diagnosis not present

## 2017-03-02 DIAGNOSIS — N329 Bladder disorder, unspecified: Secondary | ICD-10-CM | POA: Diagnosis not present

## 2017-03-02 DIAGNOSIS — M545 Low back pain: Secondary | ICD-10-CM | POA: Diagnosis not present

## 2017-03-02 DIAGNOSIS — R233 Spontaneous ecchymoses: Secondary | ICD-10-CM | POA: Diagnosis not present

## 2017-03-02 DIAGNOSIS — I10 Essential (primary) hypertension: Secondary | ICD-10-CM | POA: Diagnosis not present

## 2017-03-02 DIAGNOSIS — E7800 Pure hypercholesterolemia, unspecified: Secondary | ICD-10-CM | POA: Diagnosis not present

## 2017-03-02 DIAGNOSIS — F5109 Other insomnia not due to a substance or known physiological condition: Secondary | ICD-10-CM | POA: Diagnosis not present

## 2017-03-02 DIAGNOSIS — G8929 Other chronic pain: Secondary | ICD-10-CM | POA: Diagnosis not present

## 2017-03-02 MED ORDER — ALPRAZOLAM 0.5 MG PO TABS: 0.5000 mg | ORAL_TABLET | Freq: Every evening | ORAL | 0 refills | Status: DC | PRN

## 2017-03-02 NOTE — Patient Instructions (Signed)

## 2017-03-02 NOTE — Addendum Note (Signed)
Addended by: Larey Seat on: 03/02/2017 03:33 PM   Modules accepted: Orders

## 2017-03-02 NOTE — Progress Notes (Signed)
SLEEP MEDICINE CLINIC   Provider:  Larey Seat, [place] D  Primary Care Physician:  Denita Lung, MD   Referring Provider: Denita Lung, MD    Chief Complaint  Patient presents with  . New Patient (Initial Visit)    HPI:  Andrew Yang is a 66 y.o. male , seen here as in a referral from Dr. Redmond School for evaluation and treatment of sleep apnea.  [name] reports that many years ago had undergone a home sleep test, was diagnosed by a home sleep test was apnea and given a CPAP but could not tolerate using it. He returned the machine was in the first month. Since then there have been no further attempts to re-classify his apnea or other choices of treatments. He continues to snore loudly, and sleeps with the TV on.   Chief complaint according to patient :" I need to look at OSA again. "  Sleep habits are as follows: usually watches TV, gets to bed by 11.15 and watches more TV until midnight. From there he sleeps until 7.30 AM.on weekends, when he enjoys some drinks, he is usually sleeping longer easily until 9 AM. His bedroom is not completely dark, but quiet with a plan for background noise, and cool. He sleeps on one pillow , a contour pillow. Andrew Yang sleeps in another bedroom, stating his snoring is keeping her awake. [name] reports that he dreams frequently and vividly. He reports that the frequency of nocturia is variable between 1 and 4 times at night, depending on fluid intake. The patient is a prone sleeper, but can also sleep well on his side. He does not enjoy supine sleep which was a problem with CPAP use.  Sleep medical history and family sleep history:  Recent cataract surgery right - 14 days ago, tomorrow the left eye.   Social history: he drinks decaffeinated cold tea, 2 glasses a day. 1-2 diet Cokes a day, and one cup of coffee in the morning. He quit smoking and since then has consumed less soda. 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   Married, 2 adult children  Daughter  is 21 and son is 15, expecting first grandchild.    Review of Systems: Out of a complete 14 system review, the patient complains of only the following symptoms, and all other reviewed systems are negative.    Epworth score 5, Fatigue severity score 9, depression score 1/15    Social History   Social History  . Marital status: Married    Spouse name: N/A  . Number of children: N/A  . Years of education: N/A   Occupational History  . Not on file.   Social History Main Topics  . Smoking status: Former Smoker    Quit date: 10/27/2010  . Smokeless tobacco: Never Used  . Alcohol use 12.0 oz/week    20 Cans of beer per week  . Drug use: No  . Sexual activity: Yes   Other Topics Concern  . Not on file   Social History Narrative  . No narrative on file    Family History  Problem Relation Age of Onset  . Arthritis Mother   . Hypertension Father   . Heart disease Maternal Grandfather     Past Medical History:  Diagnosis Date  . Allergy    RHINITIS  . Colonic polyp   . Diverticulosis   . Dyslipidemia   .  Hemorrhoids   . Hepatitis C   . Sleep apnea   . Smoker     Past Surgical History:  Procedure Laterality Date  . ANTERIOR CRUCIATE LIGAMENT REPAIR  2006    Current Outpatient Prescriptions  Medication Sig Dispense Refill  . Albuterol Sulfate (PROAIR RESPICLICK) 063 (90 Base) MCG/ACT AEPB Inhale 2 puffs into the lungs 4 (four) times daily as needed. 1 each 0  . Butalbital-APAP-Caffeine 50-300-40 MG CAPS Take 50 mg by mouth as needed. 30 capsule 2  . celecoxib (CELEBREX) 200 MG capsule TAKE 1 CAPSULE (200 MG TOTAL) BY MOUTH 2 (TWO) TIMES DAILY. 30 capsule 5  . losartan-hydrochlorothiazide (HYZAAR) 100-12.5 MG tablet Take 1 tablet by mouth daily. 90 tablet 3  . metaxalone (SKELAXIN) 800 MG tablet TAKE 1 TABLET BY MOUTH AS NEEDED 90 tablet 0  . rosuvastatin (CRESTOR) 40 MG tablet  Take 1 tablet (40 mg total) by mouth daily. 90 tablet 3  . tadalafil (CIALIS) 20 MG tablet Take 1 tablet (20 mg total) by mouth daily as needed for erectile dysfunction. 10 tablet 11  . venlafaxine (EFFEXOR) 75 MG tablet TAKE 1 TABLET BY MOUTH TWICE A DAY 180 tablet 1   No current facility-administered medications for this visit.     Allergies as of 03/02/2017 - Review Complete 03/02/2017  Allergen Reaction Noted  . Lisinopril Cough 04/17/2014    Vitals: BP (!) 145/70   Pulse 70   Ht 6\' 1"  (1.854 m)   Wt 223 lb 8 oz (101.4 kg)   BMI 29.49 kg/m  Last Weight:  Wt Readings from Last 1 Encounters:  03/02/17 223 lb 8 oz (101.4 kg)   KZS:WFUX mass index is 29.49 kg/m.     Last Height:   Ht Readings from Last 1 Encounters:  03/02/17 6\' 1"  (1.854 m)    Physical exam:  General: The patient is awake, alert and appears not in acute distress. The patient is well groomed. Head: Normocephalic, atraumatic. Neck is supple. Mallampati 4 neck circumference: 20 . Nasal airflow congested , TMJ click is not evident . Retrognathia is not seen, but crowded lower jaw .  Cardiovascular:  Regular rate and rhythm, without  murmurs or carotid bruit, and without distended neck veins. Respiratory: Lungs are clear to auscultation. Skin:  Without evidence of edema, or rash Trunk: BMI is 29.5 . The patient's posture is erect   Neurologic exam : The patient is awake and alert, oriented to place and time. Attention span & concentration ability appears normal.  Speech is fluent,  without  dysarthria, dysphonia or aphasia. Mood and affect are appropriate. Cranial nerves: Pupils are equal and briskly reactive to light. Funduscopic exam without evidence of pallor or edema. Extraocular movements  in vertical and horizontal planes intact and without nystagmus. Visual fields by finger perimetry are intact.Hearing to finger rub intact. Facial sensation intact to fine touch. Facial motor strength is symmetric and  tongue and uvula move midline. Shoulder shrug was symmetrical.   Motor exam:  Normal tone, muscle bulk and symmetric strength in all extremities. Sensory:  Fine touch, pinprick and vibration were tested in all extremities. Proprioception tested in the upper extremities was normal. Coordination: Rapid alternating movements in the fingers/hands was normal. Finger-to-nose maneuver  normal without evidence of ataxia, dysmetria or tremor. Gait and station: Patient walks without assistive device and is able unassisted to climb up to the exam table. Strength within normal limits.Stance is stable and normal.  Tandem gait is unfragmented. Turns with 3  Steps. Romberg testing is  negative. Deep tendon reflexes: in the  upper and lower extremities are symmetric and intact. Babinski maneuver response is downgoing.  Assessment:  After physical and neurologic examination, review of laboratory studies,  Personal review of imaging studies, reports of other /same  Imaging studies, results of polysomnography and / or neurophysiology testing and pre-existing records as far as provided in visit., my assessment is :  1)  I strongly suspect UARS with apnea as the cause of the patients snoring. He chokes and has been witnessed to have apnea.  Risk factors include a larger than average neck size, the anatomy of the upper airway documents restriction, a functional macroglossia seen. There is on the other hand normal muscle tone and normal reflexes, no evidence of previous strokes, traumatic injuries, spasticity. There are no major co-morbidities aside from the hypertension which seems not to be a malignant form.  I will order an attended split night- AHI of 20 for SPLIT. Patient can take a low dose of Xanax as sleep inducer if needed( regular bed time is around midnight).  The patient was advised of the nature of the diagnosed disorder , the treatment options and the  risks for general health and wellness arising from not  treating the condition.   I spent more than 45 minutes of face to face time with the patient.  Greater than 50% of time was spent in counseling and coordination of care. We have discussed the diagnosis and differential and I answered the patient's questions.    Plan:  Treatment plan and additional workup : GENTLE titration.   Xanax 0.5 mg prn. SPLIT at AHI 20. Nasal patency : nasonex prn Biotin mouthwash  Rv after study.  Larey Seat, MD 08/28/1476, 2:95 PM  Certified in Neurology by ABPN Certified in [place] by [facility] [address], [place], [place] [address]

## 2017-03-03 DIAGNOSIS — H2512 Age-related nuclear cataract, left eye: Secondary | ICD-10-CM | POA: Diagnosis not present

## 2017-03-30 ENCOUNTER — Ambulatory Visit (INDEPENDENT_AMBULATORY_CARE_PROVIDER_SITE_OTHER): Payer: Medicare HMO | Admitting: Neurology

## 2017-03-30 DIAGNOSIS — G4710 Hypersomnia, unspecified: Secondary | ICD-10-CM

## 2017-03-30 DIAGNOSIS — G4733 Obstructive sleep apnea (adult) (pediatric): Secondary | ICD-10-CM

## 2017-03-30 DIAGNOSIS — R0683 Snoring: Secondary | ICD-10-CM

## 2017-03-30 DIAGNOSIS — F5109 Other insomnia not due to a substance or known physiological condition: Secondary | ICD-10-CM

## 2017-03-30 DIAGNOSIS — F519 Sleep disorder not due to a substance or known physiological condition, unspecified: Secondary | ICD-10-CM

## 2017-03-30 DIAGNOSIS — G4730 Sleep apnea, unspecified: Secondary | ICD-10-CM

## 2017-04-03 ENCOUNTER — Telehealth: Payer: Self-pay | Admitting: Neurology

## 2017-04-03 DIAGNOSIS — G4739 Other sleep apnea: Secondary | ICD-10-CM

## 2017-04-03 DIAGNOSIS — I499 Cardiac arrhythmia, unspecified: Secondary | ICD-10-CM

## 2017-04-03 DIAGNOSIS — G4731 Primary central sleep apnea: Secondary | ICD-10-CM

## 2017-04-03 NOTE — Procedures (Signed)
PATIENT'S NAME:  Andrew Yang, Andrew Yang DOB:      1951/04/28      MR#:    [id number]     DATE OF RECORDING: 03/30/2017 REFERRING M.D.:  Jill Alexanders MD Study Performed:   Baseline Polysomnogram HISTORY:  REFORD OLLIFF is a 66 year old Caucasian male patient of Dr. Redmond School, seen for evaluation and treatment of sleep apnea.  [name] that many years ago had undergone a home sleep test, was diagnosed by a home sleep test with sleep apnea and given a CPAP- but could not tolerate using it. He returned the machine within the first month. Since then there have been no further attempts to re-classify his apnea or other choices of treatments. He continues to snore loudly, and sleeps with the TV on.    The patient endorsed the Epworth Sleepiness Scale at 5 points.   The patient's weight 223 pounds with a height of 73 (inches), resulting in a BMI of 29.5 kg/m2. The patient's neck circumference measured 20 inches.  CURRENT MEDICATIONS: ProAir, Celebrex, Hyzaar, Skalaxin, Crestor, Cialis, Effexor   PROCEDURE:  This is a multichannel digital polysomnogram utilizing the SomnoStar 11.2 system.  Electrodes and sensors were applied and monitored per AASM Specifications.   EEG, EOG, Chin and Limb EMG, were sampled at 200 Hz.  ECG, Snore and Nasal Pressure, Thermal Airflow, Respiratory Effort, CPAP Flow and Pressure, Oximetry was sampled at 50 Hz. Digital video and audio were recorded.      BASELINE STUDY  Lights Out was at 22:33 and Lights On at 05:03.  Total recording time (TRT) was 390.5 minutes, with a total sleep time (TST) of 300.5 minutes.   The patient's sleep latency was 63 minutes.  REM latency was 134.5 minutes.  The sleep efficiency was 77.1 %.     SLEEP ARCHITECTURE: WASO (Wake after sleep onset) was 27 minutes.  There were 59.5 minutes in Stage N1, 96 minutes Stage N2, 83.5 minutes Stage N3 and 61.5 minutes in Stage REM.  The percentage of Stage N1 was 19.8%, Stage N2 was 31.9%, Stage N3 was 27.8% and  Stage R (REM sleep) was 20.5%.   RESPIRATORY ANALYSIS:  There were a total of 146 respiratory events:  13 obstructive apneas, 2 central apneas and 1 mixed apnea with a total of 16 apneas and an apnea index (AI) of 3.2 /hour. There were 130 hypopneas with a hypopnea index of 26.1 /hour. The patient also had 7 respiratory event related arousals (RERAs).     The total APNEA/HYPOPNEA INDEX (AHI) was 29.2/hour and the total RESPIRATORY DISTURBANCE INDEX was 30.5 /hour.  12 events occurred in REM sleep and 252 events in NREM. The REM AHI was 12.0 /hour, versus a non-REM AHI of 36.4. The patient spent 0 minutes of total sleep time in the supine position and 301 minutes in non-supine = non-supine AHI of 29.2.  OXYGEN SATURATION & C02:  The Wake baseline 02 saturation was 92%, with the lowest being 86%. Time spent below 89% saturation equaled 14 minutes.   PERIODIC LIMB MOVEMENTS:  The patient had a total of 5 Periodic Limb Movements.  The arousals were noted as: 26 were spontaneous, 0 were associated with PLMs, and 92 were associated with respiratory events. The frequency of apnea/ hypopnea increased with the duration of the sleep study. Snoring was noted. EKG with some aberrant rhythm : Bigemini, PVCs.  Audio and video analysis did not show any abnormal or unusual movements, behaviors, phonations or vocalizations. Rare PLMs  did not cause arousals, but were seen in REM sleep. The patient took 2 bathroom breaks.  IMPRESSION: 1. Multiple forms of Sleep Apnea (Complex SA), obstructive apnea is dominant.  Moderate degree without hypoxemia but some other indicators of physiological changes. 2. Irregular heart beat in sleep.  3. There were rare PLMs, some in REM sleep, which can indicate REM behavior disorder.   4. Snoring  RECOMMENDATIONS: 1. Advise either 1) full-night, attended, PAP titration study to optimize therapy. Or 2) dental device if CPAP is not tolerated, again. Dental devices are able to treat  snoring and reduce apnea. I would prefer to start the patient on CPAP.   2. Avoid sedative-hypnotics which may worsen sleep apnea, alcohol and tobacco (as applicable). 3. Advise to lose weight, diet and exercise if not contraindicated (BMI 29.5) Further information regarding OSA may be obtained from [employer] (www.sleepfoundation.org) or American Sleep Apnea Association (www.sleepapnea.org). Given the history of claustrophobia, CPAP desensitization protocol should be considered.  This may be arranged directly with [facility].   4. A follow up appointment will be scheduled in the Sleep Clinic at [facility]. The referring provider will be notified of the results.      I certify that I have reviewed the entire raw data recording prior to the issuance of this report in accordance with the Standards of Accreditation of the [place] Academy of Sleep Medicine (AASM)     Larey Seat, MD  04-01-2017  Diplomat, American Board of Psychiatry and Neurology  Diplomat, American Board of [place] Director, [place] [facility] [employer]

## 2017-04-03 NOTE — Telephone Encounter (Signed)
PATIENT'S NAME:  Andrew, Yang DOB:      1951-03-30      MR#:    [id number]     DATE OF RECORDING: 03/30/2017 REFERRING M.D.:  Jill Alexanders MD Study Performed:   Baseline Polysomnogram HISTORY:  Andrew Yang is a 66 year old Caucasian male patient of Dr. Redmond School, seen for evaluation and treatment of sleep apnea.  Andrew Yang reports that many years ago had undergone a home sleep test, was diagnosed by a home sleep test with sleep apnea and given a CPAP- but could not tolerate using it. He returned the machine within the first month. Since then there have been no further attempts to re-classify his apnea or other choices of treatments. He continues to snore loudly, and sleeps with the TV on.    The patient endorsed the Epworth Sleepiness Scale at 5 points.   The patient's weight 223 pounds with a height of 73 (inches), resulting in a BMI of 29.5 kg/m2. The patient's neck circumference measured 20 inches.  CURRENT MEDICATIONS: ProAir, Celebrex, Hyzaar, Skalaxin, Crestor, Cialis, Effexor   PROCEDURE:  This is a multichannel digital polysomnogram utilizing the SomnoStar 11.2 system.  Electrodes and sensors were applied and monitored per AASM Specifications.   EEG, EOG, Chin and Limb EMG, were sampled at 200 Hz.  ECG, Snore and Nasal Pressure, Thermal Airflow, Respiratory Effort, CPAP Flow and Pressure, Oximetry was sampled at 50 Hz. Digital video and audio were recorded.      BASELINE STUDY  Lights Out was at 22:33 and Lights On at 05:03.  Total recording time (TRT) was 390.5 minutes, with a total sleep time (TST) of 300.5 minutes.   The patient's sleep latency was 63 minutes.  REM latency was 134.5 minutes.  The sleep efficiency was 77.1 %.     SLEEP ARCHITECTURE: WASO (Wake after sleep onset) was 27 minutes.  There were 59.5 minutes in Stage N1, 96 minutes Stage N2, 83.5 minutes Stage N3 and 61.5 minutes in Stage REM.  The percentage of Stage N1 was 19.8%, Stage N2 was 31.9%, Stage N3 was 27.8% and  Stage R (REM sleep) was 20.5%.   RESPIRATORY ANALYSIS:  There were a total of 146 respiratory events:  13 obstructive apneas, 2 central apneas and 1 mixed apnea with a total of 16 apneas and an apnea index (AI) of 3.2 /hour. There were 130 hypopneas with a hypopnea index of 26.1 /hour. The patient also had 7 respiratory event related arousals (RERAs).     The total APNEA/HYPOPNEA INDEX (AHI) was 29.2/hour and the total RESPIRATORY DISTURBANCE INDEX was 30.5 /hour.  12 events occurred in REM sleep and 252 events in NREM. The REM AHI was 12.0 /hour, versus a non-REM AHI of 36.4. The patient spent 0 minutes of total sleep time in the supine position and 301 minutes in non-supine = non-supine AHI of 29.2.  OXYGEN SATURATION & C02:  The Wake baseline 02 saturation was 92%, with the lowest being 86%. Time spent below 89% saturation equaled 14 minutes.   PERIODIC LIMB MOVEMENTS:  The patient had a total of 5 Periodic Limb Movements.  The arousals were noted as: 26 were spontaneous, 0 were associated with PLMs, and 92 were associated with respiratory events. The frequency of apnea/ hypopnea increased with the duration of the sleep study. Snoring was noted. EKG with some aberrant rhythm : Bigemini, PVCs.  Audio and video analysis did not show any abnormal or unusual movements, behaviors, phonations or vocalizations. Rare PLMs  did not cause arousals, but were seen in REM sleep. The patient took 2 bathroom breaks.  IMPRESSION: 1. Multiple forms of Sleep Apnea (Complex SA), obstructive apnea is dominant.  Moderate degree without hypoxemia but some other indicators of physiological changes. 2. Irregular heart beat in sleep.  3. There were rare PLMs, some in REM sleep, which can indicate REM behavior disorder.   4. Snoring  RECOMMENDATIONS: 1. Advise either 1) full-night, attended, PAP titration study to optimize therapy. Or 2) dental device if CPAP is not tolerated, again. Dental devices are able to treat  snoring and reduce apnea. I would prefer to start the patient on CPAP.   2. Avoid sedative-hypnotics which may worsen sleep apnea, alcohol and tobacco (as applicable). 3. Advise to lose weight, diet and exercise if not contraindicated (BMI 29.5) Further information regarding OSA may be obtained from [employer] (www.sleepfoundation.org) or American Sleep Apnea Association (www.sleepapnea.org). Given the history of claustrophobia, CPAP desensitization protocol should be considered.  This may be arranged directly with [facility].   4. A follow up appointment will be scheduled in the Sleep Clinic at [facility]. The referring provider will be notified of the results.      I certify that I have reviewed the entire raw data recording prior to the issuance of this report in accordance with the Standards of Accreditation of the [place] Academy of Sleep Medicine (AASM)     Larey Seat, MD  04-01-2017  Diplomat, American Board of Psychiatry and Neurology  Diplomat, American Board of [place] Director, [place] [facility] [employer]

## 2017-04-06 NOTE — Telephone Encounter (Signed)
I called pt. I advised pt that Dr. Brett Fairy reviewed their sleep study results and found that pt has multiple forms of sleep apnea. Dr. Brett Fairy recommends that pt comes in for a CPAP titration study. Pt verbalized understanding of results. Pt had no questions at this time but was encouraged to call back if questions arise.

## 2017-04-27 ENCOUNTER — Ambulatory Visit (INDEPENDENT_AMBULATORY_CARE_PROVIDER_SITE_OTHER): Payer: Medicare HMO | Admitting: Neurology

## 2017-04-27 DIAGNOSIS — G4731 Primary central sleep apnea: Secondary | ICD-10-CM | POA: Diagnosis not present

## 2017-04-27 DIAGNOSIS — I499 Cardiac arrhythmia, unspecified: Secondary | ICD-10-CM

## 2017-04-30 NOTE — Procedures (Signed)
PATIENT'S NAME:  Andrew Yang, Andrew Yang DOB:      April 24, 1951      MR#:    [id number]     DATE OF RECORDING: 04/27/2017 REFERRING M.D.:  Jill Alexanders MD Study Performed:   CPAP  Titration HISTORY:  A baseline sleep study had been performed on 03/30/17 resulting in an APNEA/HYPOPNEA INDEX (AHI) 29.2/hour and RESPIRATORY DISTURBANCE INDEX was 30.5 /hour.   The REM AHI was 12.0 /hour, versus a non-REM AHI of 36.4.  Andrew Yang is a 66 year old Caucasian male patient of Dr. Redmond School, who was diagnosed by a home sleep test with sleep apnea and given a CPAP- but could not tolerate using it. He returned the machine within the first month. Since then there have been no further attempts to re-classify his apnea or other choices of treatments.   The patient endorsed the Epworth Sleepiness Scale at 5 points.   The patient's weight 223 pounds with a height of 73 (inches), resulting in a BMI of 29.5 kg/m2. The patient's neck circumference measured 20 inches.  CURRENT MEDICATIONS: ProAir, Celebrex, Hyzaar, Skalaxin, Crestor, Cialis, Effexor   PROCEDURE:  This is a multichannel digital polysomnogram utilizing the SomnoStar 11.2 system.  Electrodes and sensors were applied and monitored per AASM Specifications.   EEG, EOG, Chin and Limb EMG, were sampled at 200 Hz.  ECG, Snore and Nasal Pressure, Thermal Airflow, Respiratory Effort, CPAP Flow and Pressure, Oximetry was sampled at 50 Hz. Digital video and audio were recorded.      CPAP was initiated at 6 cmH20 with heated humidity per AASM split night standards and pressure was advanced to 8cmH20 because of hypopneas, apneas and desaturations.  At a PAP pressure of 8 cmH20, there was a reduction of the AHI to 0.0 with a sleep efficiency of 85.2% for 170 minutes of sleep, with REM rebound. Lights Out was at 22:45 and Lights On at 05:39. Total recording time (TRT) was 414.5 minutes, with a total sleep time (TST) of 329 minutes. The patient's sleep latency was 53 minutes with 0  minutes of wake time after sleep onset. REM latency was 284.5 minutes.  The sleep efficiency was 79.4 %.    SLEEP ARCHITECTURE: WASO (Wake after sleep onset) was 32.5 minutes.  There were 7.5 minutes in Stage N1, 185 minutes Stage N2, 59.5 minutes Stage N3 and 77 minutes in Stage REM.  The percentage of Stage N1 was 2.3%, Stage N2 was 56.2%, Stage N3 was 18.1% and Stage R (REM sleep) was 23.4%.   RESPIRATORY ANALYSIS:  There was a total of 0 respiratory events: 0 apneas and 0 hypopneas 0 respiratory event related arousals (RERAs).     The total APNEA/HYPOPNEA INDEX (AHI) was 0.0 /hour and the total RESPIRATORY DISTURBANCE INDEX was 0.0/hour.  The patient spent 0 minutes of total sleep time in the supine position and 329 minutes in non-supine.  OXYGEN SATURATION & C02:  The baseline 02 saturation was 96%, with the lowest being 88%. Time spent below 89% saturation equaled 1 minute. EKG was highly irregular - variable R to R interval, skipped beats, loss of P wave.   PERIODIC LIMB MOVEMENTS:   The patient had a total of 0 Periodic Limb Movements. The arousals were noted as: 38 were spontaneous, 0 were associated with PLMs, and 0 were associated with respiratory events. Audio and video analysis did not show any abnormal or unusual movements, behaviors, phonations or vocalizations.   Post-study, the patient indicated that sleep was the same  as usual.   DIAGNOSIS Obstructive Sleep Apnea, responding well to low pressures of CPAP with REM sleep rebounding at 9 cm water, EPR of 1 cm.  The patient was fitted with a ResMed AirFit P10 medium pillows apparatus.  PLANS/RECOMMENDATIONS: a. CPAP clinic follow-up in 2-3 months after receiving device; and thereafter, yearly CPAP clinic follow-up is advisable. The patient may benefit from : b. Weight loss, if appropriate. c. Avoidance of medications with muscle relaxant properties, if applicable. d. Avoidance of ingestion of alcohol prior to sleeping, if applicable  . e. Avoiding sleeping in the supine position (on one's back). f. Improvement of nasal patency if indicated. 2. Sleep hygiene:  Sleep hygiene measures should be carefully reviewed with the patient.  Check the bedroom environment for light, noise, temperature, or other factors which could disrupt sleep; follow a regular schedule for sleeping and waking (goal is seven to eight hours sleep per night, but patient should not be in bed any time he or she does not feel like sleeping); limit naps to no more than one hour and no later than mid-afternoon; medically-supervised exercise plan, with exercise in late afternoon and no later than three hours prior to bedtime; avoid eating just before bed; avoid caffeine for six hours before bedtime; avoid alcohol; avoid tobacco; engage only in relaxing activities prior to bed.  Avoid looking at alarm clock in bed; turn alarm clock facing away from patient.  If the patient worries in bed, one helpful intervention is to have the patient write down a list of concerns nightly before bed; and then to put them out of mind by discarding the list, after the patient confirms that there is nothing more he/she can do about them today.  A follow up appointment will be scheduled in the Sleep Clinic at [facility].   Please call [phone] with any questions.      I certify that I have reviewed the entire raw data recording prior to the issuance of this report in accordance with the Standards of Accreditation of the American Academy of Sleep Medicine (AASM)   Larey Seat, M.D.   04-30-2017  Diplomat, American Board of Psychiatry and Neurology  Diplomat, [place] of Sleep Medicine Medical Director, [place] [facility]

## 2017-04-30 NOTE — Addendum Note (Signed)
Addended by: Larey Seat on: 04/30/2017 12:31 PM   Modules accepted: Orders

## 2017-05-03 ENCOUNTER — Telehealth: Payer: Self-pay | Admitting: Neurology

## 2017-05-03 NOTE — Telephone Encounter (Signed)
-----   Message from Larey Seat, MD sent at 04/30/2017 12:31 PM EDT ----- DIAGNOSIS; Moderate Obstructive Sleep Apnea, responding well to low  pressures of CPAP. REM sleep rebounding at 9 cm water, EPR of  1 cm. AHI was 0.0/hr. The patient was fitted with a ResMed AirFit P10 medium  pillows apparatus.

## 2017-05-03 NOTE — Telephone Encounter (Signed)
I called pt. I advised pt that Dr. Brett Fairy reviewed their sleep study results and found that pt does have OSA. Dr. Brett Fairy recommends that pt starts CPAP. I reviewed PAP compliance expectations with the pt. Pt is agreeable to starting a CPAP. I advised pt that an order will be sent to a DME, [facility], and [facility] will call the pt within about one week after they file with the pt's insurance. [facility] will show the pt how to use the machine, fit for masks, and troubleshoot the CPAP if needed. A follow up appt will need to be made for insurance purposes. Pt states that he would call back to schedule this. Pt verbalized understanding to arrive 15 minutes early and bring their CPAP. A letter with all of this information in it will be mailed to the pt as a reminder. I verified with the pt that the address we have on file is correct. Pt verbalized understanding of results. Pt had no questions at this time but was encouraged to call back if questions arise.

## 2017-05-05 DIAGNOSIS — L738 Other specified follicular disorders: Secondary | ICD-10-CM | POA: Diagnosis not present

## 2017-05-05 DIAGNOSIS — D2371 Other benign neoplasm of skin of right lower limb, including hip: Secondary | ICD-10-CM | POA: Diagnosis not present

## 2017-05-05 DIAGNOSIS — Z85828 Personal history of other malignant neoplasm of skin: Secondary | ICD-10-CM | POA: Diagnosis not present

## 2017-05-05 DIAGNOSIS — D485 Neoplasm of uncertain behavior of skin: Secondary | ICD-10-CM | POA: Diagnosis not present

## 2017-05-05 DIAGNOSIS — L570 Actinic keratosis: Secondary | ICD-10-CM | POA: Diagnosis not present

## 2017-05-05 DIAGNOSIS — D225 Melanocytic nevi of trunk: Secondary | ICD-10-CM | POA: Diagnosis not present

## 2017-05-05 DIAGNOSIS — L820 Inflamed seborrheic keratosis: Secondary | ICD-10-CM | POA: Diagnosis not present

## 2017-05-05 DIAGNOSIS — L821 Other seborrheic keratosis: Secondary | ICD-10-CM | POA: Diagnosis not present

## 2017-05-14 ENCOUNTER — Other Ambulatory Visit: Payer: Self-pay | Admitting: Family Medicine

## 2017-05-27 ENCOUNTER — Other Ambulatory Visit: Payer: Self-pay | Admitting: Family Medicine

## 2017-05-27 DIAGNOSIS — F341 Dysthymic disorder: Secondary | ICD-10-CM

## 2017-05-31 DIAGNOSIS — G4733 Obstructive sleep apnea (adult) (pediatric): Secondary | ICD-10-CM | POA: Diagnosis not present

## 2017-06-04 ENCOUNTER — Ambulatory Visit (INDEPENDENT_AMBULATORY_CARE_PROVIDER_SITE_OTHER): Payer: Medicare HMO | Admitting: Family Medicine

## 2017-06-04 ENCOUNTER — Encounter: Payer: Self-pay | Admitting: Family Medicine

## 2017-06-04 VITALS — BP 140/90 | HR 130 | Resp 18 | Ht 68.5 in | Wt 228.8 lb

## 2017-06-04 DIAGNOSIS — Z23 Encounter for immunization: Secondary | ICD-10-CM

## 2017-06-04 DIAGNOSIS — G4733 Obstructive sleep apnea (adult) (pediatric): Secondary | ICD-10-CM

## 2017-06-04 DIAGNOSIS — L0291 Cutaneous abscess, unspecified: Secondary | ICD-10-CM

## 2017-06-04 DIAGNOSIS — I10 Essential (primary) hypertension: Secondary | ICD-10-CM | POA: Diagnosis not present

## 2017-06-04 MED ORDER — AMOXICILLIN-POT CLAVULANATE 875-125 MG PO TABS: 1.0000 | ORAL_TABLET | Freq: Two times a day (BID) | ORAL | 0 refills | Status: DC

## 2017-06-04 NOTE — Patient Instructions (Signed)
20 minutes of something physical daily. 

## 2017-06-04 NOTE — Progress Notes (Signed)
   Subjective:    Patient ID: Andrew Yang, male    DOB: 22-Apr-1951, 66 y.o.   MRN: [id number]  HPI He is here for multiple concerns. He does have an abscess on his left posterior thigh that is getting worse and causing more pain. He also recently started using CPAP for treatment of his underlying OSA. He notes that his blood pressure is not under adequate control.   Review of Systems     Objective:   Physical Exam Alert and in no distress. Exam of the left posterior thigh shows a 3 x 4 cm area of induration as well as erythema that is tender to palpation. It is not fluctuant.       Assessment & Plan:  Abscess - Plan: amoxicillin-clavulanate (AUGMENTIN) 875-125 MG tablet  Need for influenza vaccination - Plan: Flu vaccine HIGH DOSE PF (Fluzone High dose), CANCELED: Flu Vaccine QUAD 6+ mos PF IM (Fluarix Quad PF)  Obstructive sleep apnea  Essential hypertension  18-gauge needle used to see if there was any abscess present and none was there. I will place him on Augmentin. He is to call if he starts to have drainage or the lesion becomes fluctuant. I also talked about OSA and hypertension. Recommend re-reevaluate the blood pressure after he has been stabilized on his CPAP and if continued elevation of his blood pressure, then change that. I also use it as an opportunity to again talk to him about diet and exercise and the benefits for his hypertension, OSA and reducing risk of diabetes etc.

## 2017-06-07 ENCOUNTER — Ambulatory Visit (INDEPENDENT_AMBULATORY_CARE_PROVIDER_SITE_OTHER): Payer: Medicare HMO | Admitting: Family Medicine

## 2017-06-07 VITALS — BP 130/80 | HR 71 | Temp 97.7°F | Wt 227.8 lb

## 2017-06-07 DIAGNOSIS — L723 Sebaceous cyst: Secondary | ICD-10-CM | POA: Diagnosis not present

## 2017-06-07 DIAGNOSIS — L089 Local infection of the skin and subcutaneous tissue, unspecified: Secondary | ICD-10-CM | POA: Diagnosis not present

## 2017-06-07 NOTE — Progress Notes (Signed)
   Subjective:    Patient ID: Andrew Yang, male    DOB: 06/27/1951, 66 y.o.   MRN: [id number]  HPI He is here for a recheck. The lesion on the left posterior thigh has started to drain over the weekend.   Review of Systems     Objective:   Physical Exam 4 x 4 centimeter area of induration with surrounding erythema is noted and some drainage present.       Assessment & Plan:  Infected sebaceous cyst Dear was injected with Xylocaine and a 2 cm incision was made with material lateral being expressed. The wound was explored and cleaned with gauze. Remnants of cyst sac was removed. The wound was packed with iodoform. He will return here in 2 days for recheck.

## 2017-06-09 ENCOUNTER — Ambulatory Visit (INDEPENDENT_AMBULATORY_CARE_PROVIDER_SITE_OTHER): Payer: Medicare HMO | Admitting: Family Medicine

## 2017-06-09 DIAGNOSIS — Z4800 Encounter for change or removal of nonsurgical wound dressing: Secondary | ICD-10-CM

## 2017-06-09 NOTE — Progress Notes (Signed)
   Subjective:    Patient ID: Andrew Yang, male    DOB: 1950/09/18, 66 y.o.   MRN: [id number]  HPI He is here for packing removal.   Review of Systems     Objective:   Physical Exam Exam of the left posterior thigh shows less erythema induration and tenderness.        Assessment & Plan:  Abscess packing removal Packing was removed without difficulty. He is to irrigate the area twice per day and keep it covered.

## 2017-06-14 ENCOUNTER — Ambulatory Visit: Payer: Medicare HMO | Admitting: Family Medicine

## 2017-07-01 DIAGNOSIS — G4733 Obstructive sleep apnea (adult) (pediatric): Secondary | ICD-10-CM | POA: Diagnosis not present

## 2017-07-26 DIAGNOSIS — Z961 Presence of intraocular lens: Secondary | ICD-10-CM | POA: Diagnosis not present

## 2017-07-26 DIAGNOSIS — H538 Other visual disturbances: Secondary | ICD-10-CM | POA: Diagnosis not present

## 2017-07-27 ENCOUNTER — Other Ambulatory Visit: Payer: Self-pay | Admitting: Family Medicine

## 2017-07-27 DIAGNOSIS — N529 Male erectile dysfunction, unspecified: Secondary | ICD-10-CM

## 2017-07-27 MED ORDER — TADALAFIL 20 MG PO TABS: 20.0000 mg | ORAL_TABLET | Freq: Every day | ORAL | 11 refills | Status: DC | PRN

## 2017-07-28 ENCOUNTER — Encounter: Payer: Self-pay | Admitting: Nurse Practitioner

## 2017-07-28 NOTE — Progress Notes (Signed)
[facility]  PATIENT: Andrew Yang DOB: February 11, 1951   REASON FOR VISIT: Obstructive sleep apnea here for first CPAP compliance HISTORY FROM: Patient    HISTORY OF PRESENT ILLNESS:UPDATE 12/20/2018CM Andrew Yang, 66 year old male returns for follow-up recently diagnosed with obstructive sleep apnea by sleep study.  He is here for his first CPAP compliance.  He reports that he is adjusting well.  Data dated 06/29/2017-07/28/2017 shows greater than 4 hours at 87%.  Average usage 6 hours 32 minutes at 9 cm of pressure EPR level 1.  AHI 3 ESS 3.  FSS 11 he returns for reevaluation 03/02/17 Andrew Yang is a 66 y.o. male , seen here as in a referral from Dr. Redmond School for evaluation and treatment of sleep apnea.  Andrew Yang reports that many years ago had undergone a home sleep test, was diagnosed by a home sleep test was apnea and given a CPAP but could not tolerate using it. He returned the machine was in the first month. Since then there have been no further attempts to re-classify his apnea or other choices of treatments. He continues to snore loudly, and sleeps with the TV on.   Chief complaint according to patient :" I need to look at OSA again. "  Sleep habits are as follows: usually watches TV, gets to bed by 11.15 and watches more TV until midnight. From there he sleeps until 7.30 AM.on weekends, when he enjoys some drinks, he is usually sleeping longer easily until 9 AM. His bedroom is not completely dark, but quiet with a plan for background noise, and cool. He sleeps on one pillow , a contour pillow. Andrew Yang sleeps in another bedroom, stating his snoring is keeping her awake. Andrew Yang reports that he dreams frequently and vividly. He reports that the frequency of nocturia is variable between 1 and 4 times at night, depending on fluid intake. The patient is a prone sleeper, but can also sleep well on his side. He does not enjoy supine sleep which was a problem  with CPAP use.   REVIEW OF SYSTEMS: Full 14 system review of systems performed and notable only for those listed, all others are neg:  Constitutional: neg  Cardiovascular: neg Ear/Nose/Throat: neg  Skin: neg Eyes: neg Respiratory: neg Gastroitestinal: neg  Hematology/Lymphatic: neg  Endocrine: neg Musculoskeletal:neg Allergy/Immunology: neg Neurological: Obstructive sleep apnea with CPAP Psychiatric: neg Sleep : neg   ALLERGIES: Allergies  Allergen Reactions  . Lisinopril Cough    HOME MEDICATIONS: Outpatient Medications Prior to Visit  Medication Sig Dispense Refill  . Albuterol Sulfate (PROAIR RESPICLICK) 712 (90 Base) MCG/ACT AEPB Inhale 2 puffs into the lungs 4 (four) times daily as needed. 1 each 0  . Butalbital-APAP-Caffeine 50-300-40 MG CAPS Take 50 mg by mouth as needed. 30 capsule 2  . celecoxib (CELEBREX) 200 MG capsule TAKE 1 CAPSULE (200 MG TOTAL) BY MOUTH 2 (TWO) TIMES DAILY. 30 capsule 5  . losartan-hydrochlorothiazide (HYZAAR) 100-12.5 MG tablet Take 1 tablet by mouth daily. 90 tablet 3  . metaxalone (SKELAXIN) 800 MG tablet TAKE 1 TABLET BY MOUTH AS NEEDED 90 tablet 0  . rosuvastatin (CRESTOR) 40 MG tablet Take 1 tablet (40 mg total) by mouth daily. 90 tablet 3  . tadalafil (CIALIS) 20 MG tablet Take 1 tablet (20 mg total) by mouth daily as needed for erectile dysfunction. 10 tablet 11  . venlafaxine (EFFEXOR) 75 MG tablet TAKE 1 TABLET BY MOUTH TWICE A DAY 180 tablet 1  .  ALPRAZolam (XANAX) 0.5 MG tablet Take 1 tablet (0.5 mg total) by mouth at bedtime as needed for anxiety. 15 tablet 0  . amoxicillin-clavulanate (AUGMENTIN) 875-125 MG tablet Take 1 tablet by mouth 2 (two) times daily. 20 tablet 0   No facility-administered medications prior to visit.     PAST MEDICAL HISTORY: Past Medical History:  Diagnosis Date  . Allergy    RHINITIS  . Colonic polyp   . Diverticulosis   . Dyslipidemia   . Hemorrhoids   . Hepatitis C   . Sleep apnea   . Sleep  apnea    CPAP  . Smoker     PAST SURGICAL HISTORY: Past Surgical History:  Procedure Laterality Date  . ANTERIOR CRUCIATE LIGAMENT REPAIR  2006    FAMILY HISTORY: Family History  Problem Relation Age of Onset  . Arthritis Mother   . Hypertension Father   . Heart disease Maternal Grandfather     SOCIAL HISTORY: Social History   Socioeconomic History  . Marital status: Married    Spouse name: Not on file  . Number of children: Not on file  . Years of education: Not on file  . Highest education level: Not on file  Social Needs  . Financial resource strain: Not on file  . Food insecurity - worry: Not on file  . Food insecurity - inability: Not on file  . Transportation needs - medical: Not on file  . Transportation needs - non-medical: Not on file  Occupational History  . Not on file  Tobacco Use  . Smoking status: Former Smoker    Last attempt to quit: 10/27/2010    Years since quitting: 6.7  . Smokeless tobacco: Never Used  Substance and Sexual Activity  . Alcohol use: Yes    Alcohol/week: 12.0 oz    Types: 20 Cans of beer per week  . Drug use: No  . Sexual activity: Yes  Other Topics Concern  . Not on file  Social History Narrative  . Not on file     PHYSICAL EXAM  Vitals:   07/29/17 1336  BP: 140/81  Pulse: 83  Weight: 227 lb 12.8 oz (103.3 kg)   Body mass index is 34.13 kg/m.  Generalized: Well developed, obese male in no acute distress  Head: normocephalic and atraumatic,. Oropharynx benign  Neck: Supple,  Musculoskeletal: No deformity   Neurological examination   Mentation: Alert oriented to time, place, history taking. Attention span and concentration appropriate. Recent and remote memory intact.  Follows all commands speech and language fluent.   Cranial nerve II-XII: Pupils were equal round reactive to light extraocular movements were full, visual field were full on confrontational test. Facial sensation and strength were normal. hearing  was intact to finger rubbing bilaterally. Uvula tongue midline. head turning and shoulder shrug were normal and symmetric.Tongue protrusion into cheek strength was normal. Motor: normal bulk and tone, full strength in the BUE, BLE,  Sensory: normal and symmetric to light touch, Coordination: finger-nose-finger, heel-to-shin bilaterally, no dysmetria Gait and Station: Rising up from seated position without assistance, normal stance,  moderate stride, good arm swing, smooth turning, able to perform tiptoe, and heel walking without difficulty. Tandem gait is steady  DIAGNOSTIC DATA (LABS, IMAGING, TESTING) - I reviewed patient records, labs, notes, testing and imaging myself where available.  Lab Results  Component Value Date   WBC 6.3 10/08/2016   HGB 13.2 10/08/2016   HCT 39.0 10/08/2016   MCV 88.2 10/08/2016   PLT 206  10/08/2016      Component Value Date/Time   NA 133 (L) 10/08/2016 1459   K 4.1 10/08/2016 1459   CL 99 10/08/2016 1459   CO2 24 10/08/2016 1459   GLUCOSE 102 (H) 10/08/2016 1459   BUN 17 10/08/2016 1459   CREATININE 1.10 10/08/2016 1459   CALCIUM 8.9 10/08/2016 1459   PROT 6.7 10/08/2016 1459   ALBUMIN 4.1 10/08/2016 1459   AST 29 10/08/2016 1459   ALT 34 10/08/2016 1459   ALKPHOS 49 10/08/2016 1459   BILITOT 0.5 10/08/2016 1459   Lab Results  Component Value Date   CHOL 205 (H) 10/08/2016   HDL 39 (L) 10/08/2016   LDLCALC 99 10/08/2016   TRIG 334 (H) 10/08/2016   CHOLHDL 5.3 (H) 10/08/2016   Lab Results  Component Value Date   HGBA1C 5.9 (H) 07/06/2012    ASSESSMENT AND PLAN  66 y.o. year old male  has a past medical history of Allergy, Colonic polyp, Diverticulosis, Dyslipidemia, Hemorrhoids, Hepatitis C, Sleep apnea, Sleep apnea, and Smoker.  Here to follow-up for her first CPAP compliance. Data dated 06/29/2017-07/28/2017 shows greater than 4 hours at 87%.  Average usage 6 hours 32 minutes at 9 cm of pressure EPR level 1.  AHI 3 ESS 3.  FSS 11    CPAP compliance 87%, reviewed data with patient Continue same settings Follow-up yearly and as needed Dennie Bible, [facility], [facility], APRN  [facility] [address], [place], [place] [address] [phone]

## 2017-07-29 ENCOUNTER — Ambulatory Visit: Payer: Medicare HMO | Admitting: Nurse Practitioner

## 2017-07-29 ENCOUNTER — Encounter: Payer: Self-pay | Admitting: Nurse Practitioner

## 2017-07-29 DIAGNOSIS — Z9989 Dependence on other enabling machines and devices: Secondary | ICD-10-CM

## 2017-07-29 DIAGNOSIS — G4733 Obstructive sleep apnea (adult) (pediatric): Secondary | ICD-10-CM

## 2017-07-29 NOTE — Patient Instructions (Signed)
CPAP compliance 87% Continue same settings Follow-up yearly and as needed Is

## 2017-07-29 NOTE — Progress Notes (Signed)
I agree with the assessment and plan as directed by NP .The patient is known to me .   Anahli Arvanitis, MD  

## 2017-07-31 DIAGNOSIS — G4733 Obstructive sleep apnea (adult) (pediatric): Secondary | ICD-10-CM | POA: Diagnosis not present

## 2017-08-31 DIAGNOSIS — G4733 Obstructive sleep apnea (adult) (pediatric): Secondary | ICD-10-CM | POA: Diagnosis not present

## 2017-09-13 ENCOUNTER — Encounter: Payer: Self-pay | Admitting: Family Medicine

## 2017-09-13 ENCOUNTER — Ambulatory Visit: Payer: Medicare HMO | Admitting: Family Medicine

## 2017-09-13 ENCOUNTER — Ambulatory Visit (INDEPENDENT_AMBULATORY_CARE_PROVIDER_SITE_OTHER): Payer: Medicare HMO | Admitting: Family Medicine

## 2017-09-13 VITALS — BP 118/72 | HR 88 | Wt 229.0 lb

## 2017-09-13 DIAGNOSIS — N529 Male erectile dysfunction, unspecified: Secondary | ICD-10-CM

## 2017-09-13 DIAGNOSIS — L738 Other specified follicular disorders: Secondary | ICD-10-CM

## 2017-09-13 MED ORDER — HYDROXYZINE HCL 25 MG PO TABS: 25.0000 mg | ORAL_TABLET | Freq: Three times a day (TID) | ORAL | 0 refills | Status: DC | PRN

## 2017-09-13 MED ORDER — CIPROFLOXACIN HCL 500 MG PO TABS: 500.0000 mg | ORAL_TABLET | Freq: Two times a day (BID) | ORAL | 0 refills | Status: DC

## 2017-09-13 MED ORDER — SILDENAFIL CITRATE 20 MG PO TABS: ORAL_TABLET | ORAL | 5 refills | Status: DC

## 2017-09-13 NOTE — Progress Notes (Signed)
   Subjective:    Patient ID: Andrew Yang, male    DOB: [birth year], 67 y.o.   MRN: [id number]  HPI He was recently out [place] skiing and did use the hot tub regularly.  Several days after that he developed a pruritic type rash essentially over his entire body.  This is interfering with his ability to sleep. He has also had difficulty with getting and maintaining erections.  In the past he had also been given Cialis but would like to try something different.   Review of Systems     Objective:   Physical Exam Alert and in no distress.  Diffuse erythematous follicular rash noted over his entire body.       Assessment & Plan:  Hot tub folliculitis - Plan: ciprofloxacin (CIPRO) 500 MG tablet, hydrOXYzine (ATARAX/VISTARIL) 25 MG tablet  Erectile dysfunction, unspecified erectile dysfunction type - Plan: sildenafil (REVATIO) 20 MG tablet He did look up this diagnosis and is familiar with it.  He would like an antibiotic.  I will also give him Atarax to help with that. I then discussed the use of sildenafil with him.  Explained the fact that he is to use up to 5 of these.  Explained possible side effects and also the fact that he may not need this necessarily every time to have sex.

## 2017-09-19 ENCOUNTER — Telehealth: Payer: Self-pay | Admitting: Family Medicine

## 2017-09-19 NOTE — Telephone Encounter (Signed)
P.A. HYDROXYZINE  °

## 2017-09-26 NOTE — Telephone Encounter (Signed)
P.A. Approved til 08/09/18, pt informed

## 2017-09-27 ENCOUNTER — Telehealth: Payer: Self-pay | Admitting: Family Medicine

## 2017-09-27 DIAGNOSIS — J309 Allergic rhinitis, unspecified: Secondary | ICD-10-CM

## 2017-09-27 DIAGNOSIS — R7302 Impaired glucose tolerance (oral): Secondary | ICD-10-CM

## 2017-09-27 DIAGNOSIS — L738 Other specified follicular disorders: Secondary | ICD-10-CM

## 2017-09-27 DIAGNOSIS — I10 Essential (primary) hypertension: Secondary | ICD-10-CM

## 2017-09-27 DIAGNOSIS — Z13220 Encounter for screening for lipoid disorders: Secondary | ICD-10-CM

## 2017-09-27 MED ORDER — HYDROXYZINE HCL 25 MG PO TABS: 25.0000 mg | ORAL_TABLET | Freq: Three times a day (TID) | ORAL | 0 refills | Status: DC | PRN

## 2017-09-27 MED ORDER — LOSARTAN POTASSIUM-HCTZ 100-12.5 MG PO TABS
1.0000 | ORAL_TABLET | Freq: Every day | ORAL | 3 refills | Status: DC
Start: 2017-09-27 — End: 2018-10-18

## 2017-09-27 NOTE — Telephone Encounter (Signed)
Pt scheduled a medicare wellness visit on 11/15/2017. He also scheduled to come in for labs for that visit on 11/02/2017. Please place orders in system.

## 2017-09-27 NOTE — Addendum Note (Signed)
Addended by: Denita Lung on: 09/27/2017 01:05 PM   Modules accepted: Orders

## 2017-09-27 NOTE — Telephone Encounter (Signed)
Called pt to sched annual wellness last one 10/26/16.   He will call back. Refilled Losartan.  Pt advised he is itching again and request Hydroxyzine 25 mg to [facility].

## 2017-09-27 NOTE — Telephone Encounter (Signed)
done

## 2017-09-29 ENCOUNTER — Telehealth: Payer: Self-pay

## 2017-09-29 DIAGNOSIS — L738 Other specified follicular disorders: Secondary | ICD-10-CM

## 2017-09-29 MED ORDER — CIPROFLOXACIN HCL 500 MG PO TABS: 500.0000 mg | ORAL_TABLET | Freq: Two times a day (BID) | ORAL | 0 refills | Status: DC

## 2017-09-29 NOTE — Telephone Encounter (Signed)
Pt called to see if he could get another script for the Cipro. Thinks Skin infection has not healed all the way and seems to think the combo of the abx and antihistamine works better for skin rash . Please advise if cipro can be sent in for pt. Thanks [employer]

## 2017-10-01 DIAGNOSIS — G4733 Obstructive sleep apnea (adult) (pediatric): Secondary | ICD-10-CM | POA: Diagnosis not present

## 2017-10-19 ENCOUNTER — Ambulatory Visit (INDEPENDENT_AMBULATORY_CARE_PROVIDER_SITE_OTHER): Payer: Medicare HMO | Admitting: Family Medicine

## 2017-10-19 ENCOUNTER — Encounter: Payer: Self-pay | Admitting: Family Medicine

## 2017-10-19 VITALS — BP 126/82 | HR 86 | Temp 98.4°F | Wt 223.6 lb

## 2017-10-19 DIAGNOSIS — R059 Cough, unspecified: Secondary | ICD-10-CM

## 2017-10-19 DIAGNOSIS — J029 Acute pharyngitis, unspecified: Secondary | ICD-10-CM | POA: Diagnosis not present

## 2017-10-19 DIAGNOSIS — D1801 Hemangioma of skin and subcutaneous tissue: Secondary | ICD-10-CM | POA: Diagnosis not present

## 2017-10-19 DIAGNOSIS — D2371 Other benign neoplasm of skin of right lower limb, including hip: Secondary | ICD-10-CM | POA: Diagnosis not present

## 2017-10-19 DIAGNOSIS — L821 Other seborrheic keratosis: Secondary | ICD-10-CM | POA: Diagnosis not present

## 2017-10-19 DIAGNOSIS — R05 Cough: Secondary | ICD-10-CM | POA: Diagnosis not present

## 2017-10-19 DIAGNOSIS — L570 Actinic keratosis: Secondary | ICD-10-CM | POA: Diagnosis not present

## 2017-10-19 DIAGNOSIS — Z85828 Personal history of other malignant neoplasm of skin: Secondary | ICD-10-CM | POA: Diagnosis not present

## 2017-10-19 LAB — POCT RAPID STREP A (OFFICE): RAPID STREP A SCREEN: NEGATIVE

## 2017-10-19 MED ORDER — HYDROCOD POLST-CPM POLST ER 10-8 MG/5ML PO SUER: 5.0000 mL | Freq: Two times a day (BID) | ORAL | 0 refills | Status: DC | PRN

## 2017-10-19 NOTE — Progress Notes (Signed)
   Subjective:    Patient ID: Andrew Yang, male    DOB: [birth year], 67 y.o.   MRN: [id number]    HPI He complains of a one day history of cough and raw feeling as well as malaise but no nasal congestion, fever, chills, PND and only slight sore throat.  He is mainly concerned about strep.   Review of Systems     Objective:   Physical Exam Alert and in no distress. Tympanic membranes and canals are normal. Pharyngeal area is normal. Neck is supple without adenopathy or thyromegaly. Cardiac exam shows a regular sinus rhythm without murmurs or gallops. Lungs are clear to auscultation. Strep screen is negative       Assessment & Plan:  Cough - Plan: chlorpheniramine-HYDROcodone (TUSSIONEX PENNKINETIC ER) 10-8 MG/5ML SUER  Sore throat - Plan: Rapid Strep A I explained that I think she has a viral infection and supportive care is all that is necessary.  We will give him Tussionex to help with coughing at night.  He is comfortable with that.

## 2017-10-20 ENCOUNTER — Ambulatory Visit: Payer: Medicare HMO | Admitting: Family Medicine

## 2017-10-26 ENCOUNTER — Encounter: Payer: Self-pay | Admitting: Family Medicine

## 2017-10-26 ENCOUNTER — Ambulatory Visit (INDEPENDENT_AMBULATORY_CARE_PROVIDER_SITE_OTHER): Payer: Medicare HMO | Admitting: Family Medicine

## 2017-10-26 VITALS — BP 122/82 | HR 65 | Temp 97.6°F | Wt 223.2 lb

## 2017-10-26 DIAGNOSIS — Z20828 Contact with and (suspected) exposure to other viral communicable diseases: Secondary | ICD-10-CM | POA: Diagnosis not present

## 2017-10-26 DIAGNOSIS — J209 Acute bronchitis, unspecified: Secondary | ICD-10-CM

## 2017-10-26 LAB — POC INFLUENZA A&B (BINAX/QUICKVUE)
INFLUENZA B, POC: NEGATIVE
Influenza A, POC: NEGATIVE

## 2017-10-26 MED ORDER — OSELTAMIVIR PHOSPHATE 75 MG PO CAPS: 75.0000 mg | ORAL_CAPSULE | Freq: Every day | ORAL | 0 refills | Status: DC

## 2017-10-26 MED ORDER — AMOXICILLIN-POT CLAVULANATE 875-125 MG PO TABS: 1.0000 | ORAL_TABLET | Freq: Two times a day (BID) | ORAL | 0 refills | Status: DC

## 2017-10-26 NOTE — Progress Notes (Signed)
   Subjective:    Patient ID: Andrew Yang, male    DOB: 1951-02-15, 67 y.o.   MRN: [id number]  HPI He is here for a recheck.  He has had difficulty with sore throat, nasal and chest congestion with coughing for approximately a week now.  No fever, chills, earache.  His wife was recently diagnosed with the flu.   Review of Systems     Objective:   Physical Exam Alert and in no distress. Tympanic membranes and canals are normal. Pharyngeal area is normal. Neck is supple without adenopathy or thyromegaly. Cardiac exam shows a regular sinus rhythm without murmurs or gallops. Lungs are clear to auscultation. Flu test negative       Assessment & Plan:  Exposure to the flu - Plan: oseltamivir (TAMIFLU) 75 MG capsule  Acute bronchitis, unspecified organism - Plan: amoxicillin-clavulanate (AUGMENTIN) 875-125 MG tablet  I will place him on Tamiflu because of his exposure to the flu.  I think his symptoms are more of an acute bronchitis and will treat with Augmentin.

## 2017-10-29 DIAGNOSIS — G4733 Obstructive sleep apnea (adult) (pediatric): Secondary | ICD-10-CM | POA: Diagnosis not present

## 2017-11-02 ENCOUNTER — Other Ambulatory Visit: Payer: Self-pay

## 2017-11-02 ENCOUNTER — Other Ambulatory Visit: Payer: Medicare HMO

## 2017-11-02 DIAGNOSIS — Z125 Encounter for screening for malignant neoplasm of prostate: Secondary | ICD-10-CM | POA: Diagnosis not present

## 2017-11-02 DIAGNOSIS — I10 Essential (primary) hypertension: Secondary | ICD-10-CM | POA: Diagnosis not present

## 2017-11-02 DIAGNOSIS — Z13220 Encounter for screening for lipoid disorders: Secondary | ICD-10-CM

## 2017-11-02 DIAGNOSIS — J309 Allergic rhinitis, unspecified: Secondary | ICD-10-CM

## 2017-11-02 DIAGNOSIS — R7302 Impaired glucose tolerance (oral): Secondary | ICD-10-CM

## 2017-11-02 DIAGNOSIS — E785 Hyperlipidemia, unspecified: Secondary | ICD-10-CM | POA: Diagnosis not present

## 2017-11-02 DIAGNOSIS — R768 Other specified abnormal immunological findings in serum: Secondary | ICD-10-CM | POA: Diagnosis not present

## 2017-11-02 DIAGNOSIS — M1990 Unspecified osteoarthritis, unspecified site: Secondary | ICD-10-CM | POA: Diagnosis not present

## 2017-11-03 LAB — CBC WITH DIFFERENTIAL/PLATELET
BASOS: 0 %
Basophils Absolute: 0 10*3/uL (ref 0.0–0.2)
EOS (ABSOLUTE): 0.2 10*3/uL (ref 0.0–0.4)
EOS: 3 %
HEMATOCRIT: 41.5 % (ref 37.5–51.0)
HEMOGLOBIN: 13.3 g/dL (ref 13.0–17.7)
IMMATURE GRANULOCYTES: 0 %
Immature Grans (Abs): 0 10*3/uL (ref 0.0–0.1)
Lymphocytes Absolute: 1.9 10*3/uL (ref 0.7–3.1)
Lymphs: 36 %
MCH: 29.7 pg (ref 26.6–33.0)
MCHC: 32 g/dL (ref 31.5–35.7)
MCV: 93 fL (ref 79–97)
Monocytes Absolute: 0.6 10*3/uL (ref 0.1–0.9)
Monocytes: 12 %
NEUTROS ABS: 2.6 10*3/uL (ref 1.4–7.0)
Neutrophils: 49 %
PLATELETS: 231 10*3/uL (ref 150–379)
RBC: 4.48 x10E6/uL (ref 4.14–5.80)
RDW: 14.4 % (ref 12.3–15.4)
WBC: 5.4 10*3/uL (ref 3.4–10.8)

## 2017-11-03 LAB — COMPREHENSIVE METABOLIC PANEL
A/G RATIO: 1.6 (ref 1.2–2.2)
ALBUMIN: 4.2 g/dL (ref 3.6–4.8)
ALT: 45 IU/L — AB (ref 0–44)
AST: 28 IU/L (ref 0–40)
Alkaline Phosphatase: 68 IU/L (ref 39–117)
BILIRUBIN TOTAL: 0.3 mg/dL (ref 0.0–1.2)
BUN / CREAT RATIO: 18 (ref 10–24)
BUN: 18 mg/dL (ref 8–27)
CALCIUM: 9 mg/dL (ref 8.6–10.2)
CO2: 23 mmol/L (ref 20–29)
Chloride: 98 mmol/L (ref 96–106)
Creatinine, Ser: 0.98 mg/dL (ref 0.76–1.27)
GFR, EST AFRICAN AMERICAN: 92 mL/min/{1.73_m2} (ref >59)
GFR, EST NON AFRICAN AMERICAN: 80 mL/min/{1.73_m2} (ref >59)
GLOBULIN, TOTAL: 2.7 g/dL (ref 1.5–4.5)
Glucose: 118 mg/dL — ABNORMAL HIGH (ref 65–99)
Potassium: 4.7 mmol/L (ref 3.5–5.2)
SODIUM: 138 mmol/L (ref 134–144)
TOTAL PROTEIN: 6.9 g/dL (ref 6.0–8.5)

## 2017-11-03 LAB — LIPID PANEL
CHOLESTEROL TOTAL: 197 mg/dL (ref 100–199)
Chol/HDL Ratio: 6 ratio — ABNORMAL HIGH (ref 0.0–5.0)
HDL: 33 mg/dL — ABNORMAL LOW (ref >39)
LDL Calculated: 86 mg/dL (ref 0–99)
Triglycerides: 392 mg/dL — ABNORMAL HIGH (ref 0–149)
VLDL CHOLESTEROL CAL: 78 mg/dL — AB (ref 5–40)

## 2017-11-03 LAB — PSA: PROSTATE SPECIFIC AG, SERUM: 0.3 ng/mL (ref 0.0–4.0)

## 2017-11-05 LAB — SPECIMEN STATUS REPORT

## 2017-11-05 LAB — HGB A1C W/O EAG: HEMOGLOBIN A1C: 5.9 % — AB (ref 4.8–5.6)

## 2017-11-15 ENCOUNTER — Encounter: Payer: Self-pay | Admitting: Family Medicine

## 2017-11-15 ENCOUNTER — Ambulatory Visit (INDEPENDENT_AMBULATORY_CARE_PROVIDER_SITE_OTHER): Payer: Medicare HMO | Admitting: Family Medicine

## 2017-11-15 VITALS — BP 122/82 | HR 76 | Temp 97.9°F | Ht 72.5 in | Wt 227.2 lb

## 2017-11-15 DIAGNOSIS — M549 Dorsalgia, unspecified: Secondary | ICD-10-CM | POA: Diagnosis not present

## 2017-11-15 DIAGNOSIS — K429 Umbilical hernia without obstruction or gangrene: Secondary | ICD-10-CM

## 2017-11-15 DIAGNOSIS — N529 Male erectile dysfunction, unspecified: Secondary | ICD-10-CM | POA: Diagnosis not present

## 2017-11-15 DIAGNOSIS — R7302 Impaired glucose tolerance (oral): Secondary | ICD-10-CM

## 2017-11-15 DIAGNOSIS — J309 Allergic rhinitis, unspecified: Secondary | ICD-10-CM | POA: Diagnosis not present

## 2017-11-15 DIAGNOSIS — G8929 Other chronic pain: Secondary | ICD-10-CM

## 2017-11-15 DIAGNOSIS — Z23 Encounter for immunization: Secondary | ICD-10-CM | POA: Diagnosis not present

## 2017-11-15 DIAGNOSIS — Z0000 Encounter for general adult medical examination without abnormal findings: Secondary | ICD-10-CM

## 2017-11-15 DIAGNOSIS — G4733 Obstructive sleep apnea (adult) (pediatric): Secondary | ICD-10-CM

## 2017-11-15 DIAGNOSIS — R768 Other specified abnormal immunological findings in serum: Secondary | ICD-10-CM

## 2017-11-15 DIAGNOSIS — R7689 Other specified abnormal immunological findings in serum: Secondary | ICD-10-CM

## 2017-11-15 DIAGNOSIS — E785 Hyperlipidemia, unspecified: Secondary | ICD-10-CM | POA: Diagnosis not present

## 2017-11-15 DIAGNOSIS — M1990 Unspecified osteoarthritis, unspecified site: Secondary | ICD-10-CM

## 2017-11-15 DIAGNOSIS — Z9842 Cataract extraction status, left eye: Secondary | ICD-10-CM

## 2017-11-15 DIAGNOSIS — Z8659 Personal history of other mental and behavioral disorders: Secondary | ICD-10-CM

## 2017-11-15 DIAGNOSIS — Z9841 Cataract extraction status, right eye: Secondary | ICD-10-CM

## 2017-11-15 MED ORDER — METAXALONE 800 MG PO TABS: 800.0000 mg | ORAL_TABLET | ORAL | 0 refills | Status: DC | PRN

## 2017-11-15 NOTE — Patient Instructions (Signed)
150 minutes a week of something physical

## 2017-11-15 NOTE — Progress Notes (Addendum)
Andrew Yang is a 67 y.o. male who presents for annual wellness visit and follow-up on chronic medical conditions.  He has the following concerns: He does have an umbilical hernia and has concerns about this.  He also was recently tested and does have evidence of glucose intolerance as well as elevated triglycerides.  Continues on his CPAP and is being followed by neurology for that.  Apparently the numbers look very good.  His allergies are giving him no difficulty.  He has had bilateral cataract removal in July of last year.  He does have chronic back pain and does intermittently use Skelaxin for this.  Uses this very sparingly.  Does have a history of hepatitis C with treatment and has had negative viral loads in the past and would like to get tested again for that.  He does have intermittent difficulty with erectile dysfunction and does use Cialis as needed. He also has a long history of dysthymia and has been quite stable on Effexor for several years. Immunizations and Health Maintenance Immunization History  Administered Date(s) Administered  . Hepatitis A 10/23/1999, 05/05/2000  . Hepatitis B 10/13/1999, 12/24/1999, 05/05/2000  . IPV 04/26/2008  . Influenza Whole 07/25/2007, 04/09/2011  . Influenza, High Dose Seasonal PF 07/21/2016, 06/04/2017  . Influenza, Seasonal, Injecte, Preservative Fre 07/26/2012  . Influenza,inj,Quad PF,6+ Mos 04/17/2014, 06/13/2015  . MMR 07/15/2007  . Meningococcal Conjugate 04/26/2008  . Pneumococcal Conjugate-13 10/26/2016  . Pneumococcal Polysaccharide-23 11/15/2017  . Td 01/05/1984, 05/16/2004, 04/26/2008  . Tdap 04/26/2008, 07/26/2012  . Typhoid Inactivated 04/26/2008  . Yellow Fever 04/26/2008  . Zoster 07/26/2012  . Zoster Recombinat (Shingrix) 10/27/2017   There are no preventive care reminders to display for this patient.  Last colonoscopy: 7 years ago Last PSA: last year  Dentist:feb 2019 Ophtho: jan 2019 Exercise: trainer, golf  Other  doctors caring for patient include: Gershon Crane;Dohmeier,Lucey  Advanced Directives: Does Patient Have a Medical Advance Directive?: Yes Type of Advance Directive: Living will, Healthcare Power of Attorney Does patient want to make changes to medical advance directive?: No - Patient declined  Depression screen:  See questionnaire below.     Depression screen [facility] 2/9 11/15/2017 10/26/2016 06/26/2015  Decreased Interest 0 0 0  Down, Depressed, Hopeless 0 0 0  PHQ - 2 Score 0 0 0    Fall Screen: See Questionaire below.   Fall Risk  11/15/2017 07/29/2017 10/26/2016 06/26/2015  Falls in the past year? No No No No    ADL screen:  See questionnaire below.  Functional Status Survey: Is the patient deaf or have difficulty hearing?: No Does the patient have difficulty seeing, even when wearing glasses/contacts?: No Does the patient have difficulty concentrating, remembering, or making decisions?: No Does the patient have difficulty walking or climbing stairs?: No Does the patient have difficulty dressing or bathing?: No Does the patient have difficulty doing errands alone such as visiting a doctor's office or shopping?: Nonormal   Review of Systems  Constitutional: -, -unexpected weight change, -anorexia, -fatigue Allergy: -sneezing, -itching, -congestion Dermatology: denies changing moles, rash, lumps ENT: -runny nose, -ear pain, -sore throat,  Cardiology:  -chest pain, -palpitations, -orthopnea, Respiratory: -cough, -shortness of breath, -dyspnea on exertion, -wheezing,  Gastroenterology: -abdominal pain, -nausea, -vomiting, -diarrhea, -constipation, -dysphagia Hematology: -bleeding or bruising problems Musculoskeletal: -arthralgias, -myalgias, -joint swelling, -back pain, - Ophthalmology: -vision changes,  Urology: -dysuria, -difficulty urinating,  -urinary frequency, -urgency, incontinence Neurology: -, -numbness, , -memory loss, -falls, -dizziness    PHYSICAL EXAM:  General  Appearance: Alert, cooperative, no distress, appears stated age Head: Normocephalic, without obvious abnormality, atraumatic Eyes: PERRL, conjunctiva/corneas clear, EOM's intact, fundi benign Ears: Normal TM's and external ear canals Nose: Nares normal, mucosa normal, no drainage or sinus   tenderness Throat: Lips, mucosa, and tongue normal; teeth and gums normal Neck: Supple, no lymphadenopathy, thyroid:no enlargement/tenderness/nodules; no carotid bruit or JVD Lungs: Clear to auscultation bilaterally without wheezes, rales or ronchi; respirations unlabored Heart: Regular rate and rhythm, S1 and S2 normal, no murmur, rub or gallop Abdomen: Soft, non-tender, nondistended, normoactive bowel sounds, 1-5/1 cm umbilical hernia noted no hepatosplenomegaly Extremities: No clubbing, cyanosis or edema Pulses: 2+ and symmetric all extremities Skin: Skin color, texture, turgor normal, no rashes or lesions Lymph nodes: Cervical, supraclavicular, and axillary nodes normal Neurologic: CNII-XII intact, normal strength, sensation and gait; reflexes 2+ and symmetric throughout   Psych: Normal mood, affect, hygiene and grooming  ASSESSMENT/PLAN: Arthritis  Chronic allergic rhinitis  Glucose intolerance (impaired glucose tolerance) - Plan: Amb ref to Medical Nutrition Therapy-MNT  Obstructive sleep apnea  Hyperlipidemia with target low density lipoprotein (LDL) cholesterol less than 100 mg/dL  Hepatitis C antibody test positive - Plan: Hepatitis c vrs RNA detect by PCR-qual, CANCELED: Hepatitis C RNA quantitative  Chronic back pain, unspecified back location, unspecified back pain laterality - Plan: metaxalone (SKELAXIN) 800 MG tablet  Erectile dysfunction, unspecified erectile dysfunction type  Umbilical hernia without obstruction and without gangrene  Need for vaccination against Streptococcus pneumoniae - Plan: Pneumococcal polysaccharide vaccine 23-valent greater than or equal to 2yo  subcutaneous/IM  S/P bilateral cataract extraction  H/O dysthymia    Discussed treatment of the umbilical hernia and at this point no further intervention needed.  He will continue to treat his allergies as needed.  I will refer to nutritionist at his request to help with his dietary modification. Discussed PSA screening (risks/benefits), recommended at least 30 minutes of aerobic activity at least 5 days/week. Immunization recommendations discussed.  Colonoscopy recommendations reviewed. We will also continue him on Effexor indefinitely.  Medicare Attestation I have personally reviewed: The patient's medical and social history Their use of alcohol, tobacco or illicit drugs Their current medications and supplements The patient's functional ability including ADLs,fall risks, home safety risks, cognitive, and hearing and visual impairment Diet and physical activities Evidence for depression or mood disorders  The patient's weight, height, and BMI have been recorded in the chart.  I have made referrals, counseling, and provided education to the patient based on review of the above and I have provided the patient with a written personalized care plan for preventive services.     Jill Alexanders, MD   12/05/2017

## 2017-11-17 LAB — HEPATITIS C VRS RNA DETECT BY PCR-QUAL: HCV RNA NAA QUALITATIVE: NEGATIVE

## 2017-11-29 DIAGNOSIS — G4733 Obstructive sleep apnea (adult) (pediatric): Secondary | ICD-10-CM | POA: Diagnosis not present

## 2017-12-02 DIAGNOSIS — G4733 Obstructive sleep apnea (adult) (pediatric): Secondary | ICD-10-CM | POA: Diagnosis not present

## 2017-12-04 ENCOUNTER — Other Ambulatory Visit: Payer: Self-pay | Admitting: Family Medicine

## 2017-12-05 DIAGNOSIS — Z8659 Personal history of other mental and behavioral disorders: Secondary | ICD-10-CM | POA: Insufficient documentation

## 2017-12-06 NOTE — Telephone Encounter (Signed)
[facility] to [facility]ib. Please advise [facility]

## 2017-12-07 ENCOUNTER — Encounter: Payer: Self-pay | Admitting: Family Medicine

## 2017-12-20 ENCOUNTER — Other Ambulatory Visit: Payer: Self-pay | Admitting: Family Medicine

## 2017-12-20 DIAGNOSIS — E785 Hyperlipidemia, unspecified: Secondary | ICD-10-CM

## 2017-12-20 DIAGNOSIS — I7090 Unspecified atherosclerosis: Secondary | ICD-10-CM

## 2017-12-28 DIAGNOSIS — Z0279 Encounter for issue of other medical certificate: Secondary | ICD-10-CM

## 2017-12-29 DIAGNOSIS — G4733 Obstructive sleep apnea (adult) (pediatric): Secondary | ICD-10-CM | POA: Diagnosis not present

## 2018-01-29 DIAGNOSIS — G4733 Obstructive sleep apnea (adult) (pediatric): Secondary | ICD-10-CM | POA: Diagnosis not present

## 2018-02-25 ENCOUNTER — Other Ambulatory Visit: Payer: Self-pay | Admitting: Family Medicine

## 2018-02-25 DIAGNOSIS — G8929 Other chronic pain: Secondary | ICD-10-CM

## 2018-02-25 DIAGNOSIS — M549 Dorsalgia, unspecified: Principal | ICD-10-CM

## 2018-02-28 DIAGNOSIS — G4733 Obstructive sleep apnea (adult) (pediatric): Secondary | ICD-10-CM | POA: Diagnosis not present

## 2018-04-01 DIAGNOSIS — G4733 Obstructive sleep apnea (adult) (pediatric): Secondary | ICD-10-CM | POA: Diagnosis not present

## 2018-04-25 DIAGNOSIS — R69 Illness, unspecified: Secondary | ICD-10-CM | POA: Diagnosis not present

## 2018-04-28 DIAGNOSIS — L570 Actinic keratosis: Secondary | ICD-10-CM | POA: Diagnosis not present

## 2018-04-28 DIAGNOSIS — D225 Melanocytic nevi of trunk: Secondary | ICD-10-CM | POA: Diagnosis not present

## 2018-04-28 DIAGNOSIS — D1801 Hemangioma of skin and subcutaneous tissue: Secondary | ICD-10-CM | POA: Diagnosis not present

## 2018-04-28 DIAGNOSIS — Z85828 Personal history of other malignant neoplasm of skin: Secondary | ICD-10-CM | POA: Diagnosis not present

## 2018-04-28 DIAGNOSIS — L821 Other seborrheic keratosis: Secondary | ICD-10-CM | POA: Diagnosis not present

## 2018-04-28 DIAGNOSIS — D2371 Other benign neoplasm of skin of right lower limb, including hip: Secondary | ICD-10-CM | POA: Diagnosis not present

## 2018-06-10 ENCOUNTER — Other Ambulatory Visit: Payer: Self-pay | Admitting: Family Medicine

## 2018-06-10 NOTE — Telephone Encounter (Signed)
[facility] Please advise [facility] 

## 2018-06-23 ENCOUNTER — Other Ambulatory Visit: Payer: Self-pay | Admitting: Family Medicine

## 2018-06-23 DIAGNOSIS — F341 Dysthymic disorder: Secondary | ICD-10-CM

## 2018-06-23 NOTE — Telephone Encounter (Signed)
[facility]fe[facility]. Please advise [facility] 

## 2018-07-05 DIAGNOSIS — G4733 Obstructive sleep apnea (adult) (pediatric): Secondary | ICD-10-CM | POA: Diagnosis not present

## 2018-07-25 ENCOUNTER — Encounter: Payer: Self-pay | Admitting: Nurse Practitioner

## 2018-07-27 ENCOUNTER — Encounter: Payer: Self-pay | Admitting: Nurse Practitioner

## 2018-07-27 ENCOUNTER — Ambulatory Visit: Payer: Medicare HMO | Admitting: Nurse Practitioner

## 2018-07-27 VITALS — BP 143/89 | HR 63 | Ht 72.5 in | Wt 236.4 lb

## 2018-07-27 DIAGNOSIS — Z9989 Dependence on other enabling machines and devices: Secondary | ICD-10-CM

## 2018-07-27 DIAGNOSIS — G4733 Obstructive sleep apnea (adult) (pediatric): Secondary | ICD-10-CM | POA: Diagnosis not present

## 2018-07-27 NOTE — Patient Instructions (Signed)
CPAP compliance 87%, greater than 4 hours Continue same settings Follow-up yearly and as needed

## 2018-07-27 NOTE — Progress Notes (Signed)
[facility]  PATIENT: Andrew Yang DOB: [birth year]   REASON FOR VISIT: Obstructive sleep apnea here for  CPAP compliance HISTORY FROM: Patient    HISTORY OF PRESENT ILLNESS:UPDATE 12/18/2019CM Andrew Yang, 67 year old male returns for follow-up with history of obstructive sleep apnea here for CPAP compliance.  He has been doing well with his CPAP.  Data dated 06/26/2018-07/25/2018 shows compliance greater than 4 hours at 87%.  Usage days 97%.  Average usage 6 hours 31 minutes.  Set pressure of 9 cm leak 95th percentile 10.7.  AHI 1.1.  ESS 5 fatigue severity scale 12.  He returns for reevaluation    UPDATE 12/20/2018CM Andrew Yang, 67 year old male returns for follow-up recently diagnosed with obstructive sleep apnea by sleep study.  He is here for his first CPAP compliance.  He reports that he is adjusting well.  Data dated 06/29/2017-07/28/2017 shows greater than 4 hours at 87%.  Average usage 6 hours 32 minutes at 9 cm of pressure EPR level 1.  AHI 3 ESS 3.  FSS 11 he returns for reevaluation 03/02/17 Andrew Yang is a 67 y.o. male , seen here as in a referral from Dr. Redmond School for evaluation and treatment of sleep apnea.  Andrew Yang reports that many years ago had undergone a home sleep test, was diagnosed by a home sleep test was apnea and given a CPAP but could not tolerate using it. He returned the machine was in the first month. Since then there have been no further attempts to re-classify his apnea or other choices of treatments. He continues to snore loudly, and sleeps with the TV on.   Chief complaint according to patient :" I need to look at OSA again. "  Sleep habits are as follows: usually watches TV, gets to bed by 11.15 and watches more TV until midnight. From there he sleeps until 7.30 AM.on weekends, when he enjoys some drinks, he is usually sleeping longer easily until 9 AM. His bedroom is not completely dark, but quiet with a plan for background  noise, and cool. He sleeps on one pillow , a contour pillow. Andrew Yang sleeps in another bedroom, stating his snoring is keeping her awake. Andrew Yang reports that he dreams frequently and vividly. He reports that the frequency of nocturia is variable between 1 and 4 times at night, depending on fluid intake. The patient is a prone sleeper, but can also sleep well on his side. He does not enjoy supine sleep which was a problem with CPAP use.   REVIEW OF SYSTEMS: Full 14 system review of systems performed and notable only for those listed, all others are neg:  Constitutional: neg  Cardiovascular: neg Ear/Nose/Throat: neg  Skin: neg Eyes: neg Respiratory: neg Gastroitestinal: neg  Hematology/Lymphatic: neg  Endocrine: neg Musculoskeletal:neg Allergy/Immunology: neg Neurological: Obstructive sleep apnea with CPAP Psychiatric: neg Sleep : neg   ALLERGIES: Allergies  Allergen Reactions  . Lisinopril Cough    HOME MEDICATIONS: Outpatient Medications Prior to Visit  Medication Sig Dispense Refill  . Albuterol Sulfate (PROAIR RESPICLICK) 892 (90 Base) MCG/ACT AEPB Inhale 2 puffs into the lungs 4 (four) times daily as needed. 1 each 0  . amoxicillin-clavulanate (AUGMENTIN) 875-125 MG tablet Take 1 tablet by mouth 2 (two) times daily. 20 tablet 0  . Butalbital-APAP-Caffeine 50-300-40 MG CAPS Take 50 mg by mouth as needed. 30 capsule 2  . celecoxib (CELEBREX) 200 MG capsule TAKE 1 CAPSULE (200 MG TOTAL) BY MOUTH 2 (TWO) TIMES DAILY. [place]  capsule 5  . chlorpheniramine-HYDROcodone (TUSSIONEX PENNKINETIC ER) 10-8 MG/5ML SUER Take 5 mLs by mouth every 12 (twelve) hours as needed for cough. 140 mL 0  . hydrOXYzine (ATARAX/VISTARIL) 25 MG tablet Take 1 tablet (25 mg total) by mouth 3 (three) times daily as needed. 30 tablet 0  . losartan-hydrochlorothiazide (HYZAAR) 100-12.5 MG tablet Take 1 tablet by mouth daily. 90 tablet 3  . metaxalone (SKELAXIN) 800 MG tablet TAKE 1 TABLET (800 MG TOTAL) BY  MOUTH AS NEEDED. 90 tablet 0  . oseltamivir (TAMIFLU) 75 MG capsule Take 1 capsule (75 mg total) by mouth daily. 7 capsule 0  . rosuvastatin (CRESTOR) 40 MG tablet TAKE 1 TABLET BY MOUTH EVERY DAY 90 tablet 2  . tadalafil (CIALIS) 20 MG tablet Take 1 tablet (20 mg total) by mouth daily as needed for erectile dysfunction. 10 tablet 11  . venlafaxine (EFFEXOR) 75 MG tablet TAKE 1 TABLET BY MOUTH TWICE A DAY 180 tablet 1   No facility-administered medications prior to visit.     PAST MEDICAL HISTORY: Past Medical History:  Diagnosis Date  . Allergy    RHINITIS  . Colonic polyp   . Diverticulosis   . Dyslipidemia   . Hemorrhoids   . Hepatitis C   . Sleep apnea   . Sleep apnea    CPAP  . Smoker     PAST SURGICAL HISTORY: Past Surgical History:  Procedure Laterality Date  . ANTERIOR CRUCIATE LIGAMENT REPAIR  2006    FAMILY HISTORY: Family History  Problem Relation Age of Onset  . Arthritis Mother   . Hypertension Father   . Heart disease Maternal Grandfather     SOCIAL HISTORY: Social History   Socioeconomic History  . Marital status: Married    Spouse name: Not on file  . Number of children: Not on file  . Years of education: Not on file  . Highest education level: Not on file  Occupational History  . Not on file  Social Needs  . Financial resource strain: Not on file  . Food insecurity:    Worry: Not on file    Inability: Not on file  . Transportation needs:    Medical: Not on file    Non-medical: Not on file  Tobacco Use  . Smoking status: Former Smoker    Last attempt to quit: 10/27/2010    Years since quitting: 7.7  . Smokeless tobacco: Never Used  Substance and Sexual Activity  . Alcohol use: Yes    Alcohol/week: 15.0 standard drinks    Types: 15 Cans of beer per week  . Drug use: No  . Sexual activity: Yes  Lifestyle  . Physical activity:    Days per week: Not on file    Minutes per session: Not on file  . Stress: Not on file  Relationships    . Social connections:    Talks on phone: Not on file    Gets together: Not on file    Attends religious service: Not on file    Active member of club or organization: Not on file    Attends meetings of clubs or organizations: Not on file    Relationship status: Not on file  . Intimate partner violence:    Fear of current or ex partner: Not on file    Emotionally abused: Not on file    Physically abused: Not on file    Forced sexual activity: Not on file  Other Topics Concern  . Not on  file  Social History Narrative  . Not on file     PHYSICAL EXAM  Vitals:   07/27/18 1300  BP: (!) 143/89  Pulse: 63  Weight: 236 lb 6.4 oz (107.2 kg)  Height: 6' 0.5" (1.842 m)   Body mass index is 31.62 kg/m.  Generalized: Well developed, obese male in no acute distress  Head: normocephalic and atraumatic,. Oropharynx benign [place] 4 Neck: Supple, circumference 20 Lungs clear Musculoskeletal: No deformity  Skin no rash or edema Neurological examination   Mentation: Alert oriented to time, place, history taking. Attention span and concentration appropriate. Recent and remote memory intact.  Follows all commands speech and language fluent.   Cranial nerve II-XII: Pupils were equal round reactive to light extraocular movements were full, visual field were full on confrontational test. Facial sensation and strength were normal. hearing was intact to finger rubbing bilaterally. Uvula tongue midline. head turning and shoulder shrug were normal and symmetric.Tongue protrusion into cheek strength was normal. Motor: normal bulk and tone, full strength in the BUE, BLE,  Sensory: normal and symmetric to light touch, Coordination: finger-nose-finger, heel-to-shin bilaterally, no dysmetria Gait and Station: Rising up from seated position without assistance, normal stance,  moderate stride, good arm swing, smooth turning, able to perform tiptoe, and heel walking without difficulty. Tandem gait is  unsteady  DIAGNOSTIC DATA (LABS, IMAGING, TESTING) - I reviewed patient records, labs, notes, testing and imaging myself where available.  Lab Results  Component Value Date   WBC 5.4 11/02/2017   HGB 13.3 11/02/2017   HCT 41.5 11/02/2017   MCV 93 11/02/2017   PLT 231 11/02/2017      Component Value Date/Time   NA 138 11/02/2017 0842   K 4.7 11/02/2017 0842   CL 98 11/02/2017 0842   CO2 23 11/02/2017 0842   GLUCOSE 118 (H) 11/02/2017 0842   GLUCOSE 102 (H) 10/08/2016 1459   BUN 18 11/02/2017 0842   CREATININE 0.98 11/02/2017 0842   CREATININE 1.10 10/08/2016 1459   CALCIUM 9.0 11/02/2017 0842   PROT 6.9 11/02/2017 0842   ALBUMIN 4.2 11/02/2017 0842   AST 28 11/02/2017 0842   ALT 45 (H) 11/02/2017 0842   ALKPHOS 68 11/02/2017 0842   BILITOT 0.3 11/02/2017 0842   GFRNONAA 80 11/02/2017 0842   GFRAA 92 11/02/2017 0842   Lab Results  Component Value Date   CHOL 197 11/02/2017   HDL 33 (L) 11/02/2017   LDLCALC 86 11/02/2017   TRIG 392 (H) 11/02/2017   CHOLHDL 6.0 (H) 11/02/2017   Lab Results  Component Value Date   HGBA1C 5.9 (H) 11/02/2017    ASSESSMENT AND PLAN  67 y.o. year old male  has a past medical history of Dyslipidemia, , Sleep apnea, and Smoker.  Here to follow-up for  CPAP compliance. Data dated 06/26/2018-07/25/2018 shows compliance greater than 4 hours at 87%.  Usage days 97%.  Average usage 6 hours 31 minutes.  Set pressure of 9 cm leak 95th percentile 10.7.  AHI 1.1.  ESS 5 fatigue severity scale 12   PLAN: CPAP compliance 87%, greater than 4 hours Continue same settings Follow-up yearly and as needed Andrew Yang, [facility], [facility], APRN  [facility] [address], [place], [place] [address] [phone]

## 2018-08-01 ENCOUNTER — Ambulatory Visit: Payer: Medicare HMO | Admitting: Nurse Practitioner

## 2018-08-08 ENCOUNTER — Ambulatory Visit: Payer: Medicare HMO | Admitting: Nurse Practitioner

## 2018-10-17 ENCOUNTER — Other Ambulatory Visit: Payer: Self-pay | Admitting: Family Medicine

## 2018-10-17 DIAGNOSIS — I7090 Unspecified atherosclerosis: Secondary | ICD-10-CM

## 2018-10-17 DIAGNOSIS — E785 Hyperlipidemia, unspecified: Secondary | ICD-10-CM

## 2018-10-18 ENCOUNTER — Other Ambulatory Visit: Payer: Self-pay | Admitting: Family Medicine

## 2018-10-18 DIAGNOSIS — R69 Illness, unspecified: Secondary | ICD-10-CM | POA: Diagnosis not present

## 2018-10-18 DIAGNOSIS — I10 Essential (primary) hypertension: Secondary | ICD-10-CM

## 2018-10-20 DIAGNOSIS — M25561 Pain in right knee: Secondary | ICD-10-CM | POA: Diagnosis not present

## 2018-10-26 ENCOUNTER — Encounter: Payer: Self-pay | Admitting: Family Medicine

## 2018-10-26 ENCOUNTER — Ambulatory Visit (INDEPENDENT_AMBULATORY_CARE_PROVIDER_SITE_OTHER): Payer: Medicare HMO | Admitting: Family Medicine

## 2018-10-26 ENCOUNTER — Other Ambulatory Visit: Payer: Self-pay

## 2018-10-26 VITALS — BP 120/70 | HR 72 | Temp 98.1°F | Resp 16 | Wt 234.8 lb

## 2018-10-26 DIAGNOSIS — R52 Pain, unspecified: Secondary | ICD-10-CM | POA: Diagnosis not present

## 2018-10-26 DIAGNOSIS — R05 Cough: Secondary | ICD-10-CM

## 2018-10-26 DIAGNOSIS — J069 Acute upper respiratory infection, unspecified: Secondary | ICD-10-CM

## 2018-10-26 DIAGNOSIS — R059 Cough, unspecified: Secondary | ICD-10-CM

## 2018-10-26 LAB — POCT INFLUENZA A/B
INFLUENZA B, POC: NEGATIVE
Influenza A, POC: NEGATIVE

## 2018-10-26 NOTE — Progress Notes (Signed)
Chief Complaint  Patient presents with  . cough, sore throat    cough- 2 days, sore throat 4 days but had dental surgery. some SOB -due to mask he thinks, no fever    Subjective:  Andrew Yang is a 68 y.o. male who presents for a 2 day history of URI symptoms.   States he had dental surgery with a biopsy and sutures last Thursday. He developed throat irritation afterwards. Since then he has had a dry cough. Reports some tightness in his chest with a dry cough and post nasal drainage. Questionable body aches.   Denies fever, chills, ear pain, dizziness, headache, sinus pain, palpitations, shortness of breath, abdominal pain, N/V/D.   History of bronchitis. He used albuterol inhaler in the past and used it yesterday once. Did not really help   Smokes cigars on the weekends.   No recent travel. No known sick contacts. Works in Industrial/product designer.   He did get a flu shot.   Treatment to date: Dayquil, Nyquil, Mucinex and albuterol.  Denies sick contacts.  No other aggravating or relieving factors.  No other c/o.  ROS as in subjective.   Objective: Vitals:   10/26/18 0845  BP: 120/70  Pulse: 72  Resp: 16  Temp: 98.1 F (36.7 C)  SpO2: 95%    General appearance: Alert, WD/WN, no distress, mildly ill appearing                             Skin: warm, no rash                           Head: no sinus tenderness                            Eyes: conjunctiva normal, corneas clear, PERRLA                            Ears: pearly TMs, external ear canals normal                          Nose: septum midline, turbinates swollen, with erythema and clear discharge             Mouth/throat: MMM, tongue normal, mild pharyngeal erythema                           Neck: supple, no adenopathy, no thyromegaly, nontender                          Heart: RRR, normal S1, S2, no murmurs                         Lungs: CTA bilaterally, no wheezes, rales, or rhonchi      Assessment: Cough - Plan: POCT  Influenza A/B  Acute URI  Body aches - Plan: POCT Influenza A/B    Plan: Flu swab negative.  Discussed diagnosis and treatment of URI. Does not appear to be at risk for Covid-19.   Suggested symptomatic OTC remedies. Nasal saline spray for congestion.  Tylenol or Ibuprofen OTC for fever and malaise.  Call/return in 2-3 days if symptoms aren't resolving.

## 2018-10-26 NOTE — Patient Instructions (Signed)
Flu swab is negative.   I suspect your symptoms are related to a viral illness and recommend treating your symptoms at this point.  Mucinex for congestion and cough, drink extra water, use salt water gargles for throat irritation and Tylenol or Ibuprofen for aches and pains.  Call if you develop fever, shortness of breath or worsening symptoms.    Coronavirus (COVID-19) Are you at risk?  Are you at risk for the Coronavirus (COVID-19)?  To be considered HIGH RISK for Coronavirus (COVID-19), you have to meet the following criteria:  . Traveled to [place], [place], [place], [place] or [place]; or in the [place] to [place], [place], [place], or [place]; and have fever, cough, and shortness of breath within the last 2 weeks of travel OR . Been in close contact with a person diagnosed with COVID-19 within the last 2 weeks and have fever, cough, and shortness of breath . IF YOU DO NOT MEET THESE CRITERIA, YOU ARE CONSIDERED LOW RISK FOR COVID-19.  What to do if you are HIGH RISK for COVID-19?  Marland Kitchen If you are having a medical emergency, call 911. . Seek medical care right away. Before you go to a doctor's office, urgent care or emergency department, call ahead and tell them about your recent travel, contact with someone diagnosed with COVID-19, and your symptoms. You should receive instructions from your physician's office regarding next steps of care.  . When you arrive at healthcare provider, tell the healthcare staff immediately you have returned from visiting [place], [place], [place], [place] or [place]; or traveled in the [place] to [place], [place], [place], or [place]; in the last two weeks or you have been in close contact with a person diagnosed with COVID-19 in the last 2 weeks.   . Tell the health care staff about your symptoms: fever, cough and shortness of breath. . After you have been seen by a medical provider, you will be either: o Tested for (COVID-19) and  discharged home on quarantine except to seek medical care if symptoms worsen, and asked to  - Stay home and avoid contact with others until you get your results (4-5 days)  - Avoid travel on public transportation if possible (such as bus, train, or airplane) or o Sent to the Emergency Department by EMS for evaluation, COVID-19 testing, and possible admission depending on your condition and test results.  What to do if you are LOW RISK for COVID-19?  Reduce your risk of any infection by using the same precautions used for avoiding the common cold or flu:  Marland Kitchen Wash your hands often with soap and warm water for at least 20 seconds.  If soap and water are not readily available, use an alcohol-based hand sanitizer with at least 60% alcohol.  . If coughing or sneezing, cover your mouth and nose by coughing or sneezing into the elbow areas of your shirt or coat, into a tissue or into your sleeve (not your hands). . Avoid shaking hands with others and consider head nods or verbal greetings only. . Avoid touching your eyes, nose, or mouth with unwashed hands.  . Avoid close contact with people who are sick. . Avoid places or events with large numbers of people in one location, like concerts or sporting events. . Carefully consider travel plans you have or are making. . If you are planning any travel outside or inside the [place], visit the CDC's Travelers' Health webpage for the latest health notices. Marland Kitchen  If you have some symptoms but not all symptoms, continue to monitor at home and seek medical attention if your symptoms worsen. . If you are having a medical emergency, call 911.   [place] / e-Visit: [email or web address]         [facility]: [facility]: [phone]                   [facility]: [phone]

## 2018-10-28 ENCOUNTER — Other Ambulatory Visit: Payer: Self-pay

## 2018-10-28 ENCOUNTER — Other Ambulatory Visit: Payer: Self-pay | Admitting: Family Medicine

## 2018-10-28 ENCOUNTER — Telehealth: Payer: Self-pay | Admitting: Family Medicine

## 2018-10-28 DIAGNOSIS — R05 Cough: Secondary | ICD-10-CM

## 2018-10-28 DIAGNOSIS — R0602 Shortness of breath: Secondary | ICD-10-CM

## 2018-10-28 DIAGNOSIS — R509 Fever, unspecified: Secondary | ICD-10-CM

## 2018-10-28 DIAGNOSIS — R059 Cough, unspecified: Secondary | ICD-10-CM

## 2018-10-28 MED ORDER — AZITHROMYCIN 250 MG PO TABS: ORAL_TABLET | ORAL | 0 refills | Status: DC

## 2018-10-28 MED ORDER — BENZONATATE 200 MG PO CAPS: 200.0000 mg | ORAL_CAPSULE | Freq: Two times a day (BID) | ORAL | 0 refills | Status: DC | PRN

## 2018-10-28 NOTE — Progress Notes (Signed)
   Subjective:    Patient ID: Andrew Yang, male    DOB: 26-Aug-1950, 68 y.o.   MRN: [id number]  HPI  Spoke with patient. Symptoms are worsening since 2 days ago. Now has fever of 101, cough is worse but still dry and now having shortness of breath. Rib and chest wall pain from coughing.  Treating symptoms with Mucinex, Dayquil, Tyelnol and no relief.   Negative flu test 2 days ago.   Unknown if any contact with coronavirus or sick patient.      Review of Systems     Objective:   Physical Exam        Assessment & Plan:   Send for Covid-19 test due to fever, cough, shortness of breath. Symptoms worsening since negative flu test in my office Wednesday.  Questionable bacterial infection due to length of illness. Per Dr. Redmond School we will also treat him for this since benefit outweighs risk.

## 2018-10-28 NOTE — Telephone Encounter (Signed)
Pt called and states he is much worse. He is running a temp of 100.4, has a dry cough and is experiencing shortness of breath. He states his chest hurts. He has not done any traveling. Pt can be reached at [phone].

## 2018-10-31 ENCOUNTER — Telehealth: Payer: Self-pay | Admitting: Internal Medicine

## 2018-10-31 NOTE — Telephone Encounter (Signed)
Pt states that he is congested in the chest. He picked up antibiotic on Friday and started taking it. He has been taking mucinex for congestion

## 2018-10-31 NOTE — Telephone Encounter (Signed)
Please find out if he is taking the antibiotic and when he started it. I am happy to hear that his fever broke. Hopefully he will continue to improve. Thanks.

## 2018-10-31 NOTE — Telephone Encounter (Signed)
Pt states he is still not feeling good. Thursday- Sunday running a fever, got up to 102 Saturday night and was 100 yesterday. This morning was 97.8 temp. He has been using tynelol, still some pain in chest, and a cough. His sinuses are still acting up.

## 2018-11-04 LAB — NOVEL CORONAVIRUS, NAA: SARS-CoV-2, NAA: NOT DETECTED

## 2018-11-14 DIAGNOSIS — L821 Other seborrheic keratosis: Secondary | ICD-10-CM | POA: Diagnosis not present

## 2018-11-14 DIAGNOSIS — L570 Actinic keratosis: Secondary | ICD-10-CM | POA: Diagnosis not present

## 2018-11-14 DIAGNOSIS — D485 Neoplasm of uncertain behavior of skin: Secondary | ICD-10-CM | POA: Diagnosis not present

## 2018-11-14 DIAGNOSIS — C4441 Basal cell carcinoma of skin of scalp and neck: Secondary | ICD-10-CM | POA: Diagnosis not present

## 2018-11-14 DIAGNOSIS — D2371 Other benign neoplasm of skin of right lower limb, including hip: Secondary | ICD-10-CM | POA: Diagnosis not present

## 2018-11-14 DIAGNOSIS — Z85828 Personal history of other malignant neoplasm of skin: Secondary | ICD-10-CM | POA: Diagnosis not present

## 2018-11-22 ENCOUNTER — Encounter: Payer: Self-pay | Admitting: Family Medicine

## 2018-11-27 ENCOUNTER — Other Ambulatory Visit: Payer: Self-pay | Admitting: Family Medicine

## 2018-11-28 NOTE — Telephone Encounter (Signed)
[facility] is requesting to fill pt celebrex. Please advise [facility]

## 2018-11-29 ENCOUNTER — Other Ambulatory Visit: Payer: Self-pay

## 2018-11-29 ENCOUNTER — Other Ambulatory Visit: Payer: Medicare HMO

## 2018-11-29 ENCOUNTER — Telehealth: Payer: Self-pay | Admitting: Internal Medicine

## 2018-11-29 DIAGNOSIS — R768 Other specified abnormal immunological findings in serum: Secondary | ICD-10-CM

## 2018-11-29 DIAGNOSIS — E785 Hyperlipidemia, unspecified: Secondary | ICD-10-CM | POA: Diagnosis not present

## 2018-11-29 DIAGNOSIS — Z125 Encounter for screening for malignant neoplasm of prostate: Secondary | ICD-10-CM

## 2018-11-29 DIAGNOSIS — R7302 Impaired glucose tolerance (oral): Secondary | ICD-10-CM | POA: Diagnosis not present

## 2018-11-29 DIAGNOSIS — I10 Essential (primary) hypertension: Secondary | ICD-10-CM

## 2018-11-29 NOTE — Telephone Encounter (Signed)
Hep c was negative last year

## 2018-11-29 NOTE — Telephone Encounter (Signed)
Order was put in per appt desk not . PSA and Hep C was added. Please advise if that should be canceled. [place]

## 2018-11-29 NOTE — Telephone Encounter (Signed)
Pt is here for fasting labs and says wants hepatitis C testing on appt notes. Please put orders in

## 2018-11-30 ENCOUNTER — Encounter: Payer: Self-pay | Admitting: Family Medicine

## 2018-11-30 LAB — CBC WITH DIFFERENTIAL/PLATELET
Basophils Absolute: 0 10*3/uL (ref 0.0–0.2)
Basos: 1 %
EOS (ABSOLUTE): 0.2 10*3/uL (ref 0.0–0.4)
Eos: 4 %
Hematocrit: 43.3 % (ref 37.5–51.0)
Hemoglobin: 14.6 g/dL (ref 13.0–17.7)
Immature Grans (Abs): 0 10*3/uL (ref 0.0–0.1)
Immature Granulocytes: 0 %
Lymphocytes Absolute: 1.6 10*3/uL (ref 0.7–3.1)
Lymphs: 32 %
MCH: 30.7 pg (ref 26.6–33.0)
MCHC: 33.7 g/dL (ref 31.5–35.7)
MCV: 91 fL (ref 79–97)
Monocytes Absolute: 0.6 10*3/uL (ref 0.1–0.9)
Monocytes: 12 %
Neutrophils Absolute: 2.7 10*3/uL (ref 1.4–7.0)
Neutrophils: 51 %
Platelets: 176 10*3/uL (ref 150–450)
RBC: 4.76 x10E6/uL (ref 4.14–5.80)
RDW: 13.6 % (ref 11.6–15.4)
WBC: 5.1 10*3/uL (ref 3.4–10.8)

## 2018-11-30 LAB — COMPREHENSIVE METABOLIC PANEL WITH GFR
ALT: 34 [IU]/L (ref 0–44)
AST: 24 [IU]/L (ref 0–40)
Albumin/Globulin Ratio: 1.7 (ref 1.2–2.2)
Albumin: 4.1 g/dL (ref 3.8–4.8)
Alkaline Phosphatase: 71 [IU]/L (ref 39–117)
BUN/Creatinine Ratio: 14 (ref 10–24)
BUN: 13 mg/dL (ref 8–27)
Bilirubin Total: 0.2 mg/dL (ref 0.0–1.2)
CO2: 23 mmol/L (ref 20–29)
Calcium: 9.2 mg/dL (ref 8.6–10.2)
Chloride: 102 mmol/L (ref 96–106)
Creatinine, Ser: 0.92 mg/dL (ref 0.76–1.27)
GFR calc Af Amer: 99 mL/min/{1.73_m2}
GFR calc non Af Amer: 86 mL/min/{1.73_m2}
Globulin, Total: 2.4 g/dL (ref 1.5–4.5)
Glucose: 119 mg/dL — ABNORMAL HIGH (ref 65–99)
Potassium: 4.2 mmol/L (ref 3.5–5.2)
Sodium: 142 mmol/L (ref 134–144)
Total Protein: 6.5 g/dL (ref 6.0–8.5)

## 2018-11-30 LAB — LIPID PANEL
Chol/HDL Ratio: 4.9 ratio (ref 0.0–5.0)
Cholesterol, Total: 187 mg/dL (ref 100–199)
HDL: 38 mg/dL — ABNORMAL LOW
LDL Calculated: 96 mg/dL (ref 0–99)
Triglycerides: 264 mg/dL — ABNORMAL HIGH (ref 0–149)
VLDL Cholesterol Cal: 53 mg/dL — ABNORMAL HIGH (ref 5–40)

## 2018-11-30 LAB — PSA: Prostate Specific Ag, Serum: 0.3 ng/mL (ref 0.0–4.0)

## 2018-11-30 LAB — HEPATITIS C VRS RNA DETECT BY PCR-QUAL: HCV RNA NAA Qualitative: NEGATIVE

## 2018-12-13 ENCOUNTER — Encounter: Payer: Medicare HMO | Admitting: Family Medicine

## 2018-12-13 ENCOUNTER — Ambulatory Visit (INDEPENDENT_AMBULATORY_CARE_PROVIDER_SITE_OTHER): Payer: Medicare HMO | Admitting: Family Medicine

## 2018-12-13 ENCOUNTER — Encounter: Payer: Self-pay | Admitting: Family Medicine

## 2018-12-13 ENCOUNTER — Other Ambulatory Visit: Payer: Self-pay

## 2018-12-13 VITALS — BP 122/80 | HR 72 | Temp 96.9°F | Ht 73.75 in | Wt 231.4 lb

## 2018-12-13 DIAGNOSIS — G8929 Other chronic pain: Secondary | ICD-10-CM

## 2018-12-13 DIAGNOSIS — G4733 Obstructive sleep apnea (adult) (pediatric): Secondary | ICD-10-CM | POA: Diagnosis not present

## 2018-12-13 DIAGNOSIS — Z0000 Encounter for general adult medical examination without abnormal findings: Secondary | ICD-10-CM | POA: Diagnosis not present

## 2018-12-13 DIAGNOSIS — Z20828 Contact with and (suspected) exposure to other viral communicable diseases: Secondary | ICD-10-CM | POA: Diagnosis not present

## 2018-12-13 DIAGNOSIS — F341 Dysthymic disorder: Secondary | ICD-10-CM

## 2018-12-13 DIAGNOSIS — Z9989 Dependence on other enabling machines and devices: Secondary | ICD-10-CM

## 2018-12-13 DIAGNOSIS — M549 Dorsalgia, unspecified: Secondary | ICD-10-CM

## 2018-12-13 DIAGNOSIS — Z20822 Contact with and (suspected) exposure to covid-19: Secondary | ICD-10-CM

## 2018-12-13 DIAGNOSIS — Z8659 Personal history of other mental and behavioral disorders: Secondary | ICD-10-CM

## 2018-12-13 DIAGNOSIS — I10 Essential (primary) hypertension: Secondary | ICD-10-CM | POA: Diagnosis not present

## 2018-12-13 DIAGNOSIS — M1990 Unspecified osteoarthritis, unspecified site: Secondary | ICD-10-CM | POA: Diagnosis not present

## 2018-12-13 DIAGNOSIS — R7302 Impaired glucose tolerance (oral): Secondary | ICD-10-CM | POA: Diagnosis not present

## 2018-12-13 DIAGNOSIS — J309 Allergic rhinitis, unspecified: Secondary | ICD-10-CM | POA: Diagnosis not present

## 2018-12-13 DIAGNOSIS — Z131 Encounter for screening for diabetes mellitus: Secondary | ICD-10-CM | POA: Diagnosis not present

## 2018-12-13 DIAGNOSIS — G4710 Hypersomnia, unspecified: Secondary | ICD-10-CM

## 2018-12-13 DIAGNOSIS — E785 Hyperlipidemia, unspecified: Secondary | ICD-10-CM

## 2018-12-13 DIAGNOSIS — N529 Male erectile dysfunction, unspecified: Secondary | ICD-10-CM

## 2018-12-13 DIAGNOSIS — I7090 Unspecified atherosclerosis: Secondary | ICD-10-CM

## 2018-12-13 DIAGNOSIS — G4730 Sleep apnea, unspecified: Secondary | ICD-10-CM

## 2018-12-13 DIAGNOSIS — R768 Other specified abnormal immunological findings in serum: Secondary | ICD-10-CM | POA: Diagnosis not present

## 2018-12-13 DIAGNOSIS — K429 Umbilical hernia without obstruction or gangrene: Secondary | ICD-10-CM

## 2018-12-13 LAB — POCT UA - MICROALBUMIN
Albumin/Creatinine Ratio, Urine, POC: 3.4
Creatinine, POC: 162 mg/dL
Microalbumin Ur, POC: 5.5 mg/L

## 2018-12-13 LAB — POCT GLYCOSYLATED HEMOGLOBIN (HGB A1C): Hemoglobin A1C: 4.4 % (ref 4.0–5.6)

## 2018-12-13 MED ORDER — LOSARTAN POTASSIUM-HCTZ 100-12.5 MG PO TABS: 1.0000 | ORAL_TABLET | Freq: Every day | ORAL | 3 refills | Status: DC

## 2018-12-13 MED ORDER — VENLAFAXINE HCL 75 MG PO TABS: 75.0000 mg | ORAL_TABLET | Freq: Two times a day (BID) | ORAL | 1 refills | Status: DC

## 2018-12-13 MED ORDER — CELECOXIB 200 MG PO CAPS: ORAL_CAPSULE | ORAL | 5 refills | Status: DC

## 2018-12-13 MED ORDER — ROSUVASTATIN CALCIUM 40 MG PO TABS: 40.0000 mg | ORAL_TABLET | Freq: Every day | ORAL | 3 refills | Status: DC

## 2018-12-13 NOTE — Patient Instructions (Signed)
  Andrew Yang , Thank you for taking time to come for your Medicare Wellness Visit. I appreciate your ongoing commitment to your health goals. Please review the following plan we discussed and let me know if I can assist you in the future.   These are the goals we discussed: Continue on present medications.  We will refer to general surgery.  Briefly discussed the quite good hemoglobin A1c. This is a list of the screening recommended for you and due dates:  Health Maintenance  Topic Date Due  . Flu Shot  03/11/2019  . Colon Cancer Screening  04/19/2021  . Tetanus Vaccine  07/26/2022  .  Hepatitis C: One time screening is recommended by Center for Disease Control  (CDC) for  adults born from [birth year] through 1965.   Completed  . Pneumonia vaccines  Completed

## 2018-12-13 NOTE — Progress Notes (Signed)
Andrew Yang is a 68 y.o. male who presents for annual wellness visit,CPE and follow-up on chronic medical conditions.  He has continued to have difficulty with discomfort from his umbilical hernia.  He notes his discomfort especially when he coughs or sneezes.  He would like to get this taken care of.  He does have underlying arthritis especially of his right knee and has seen Dr. Lorre Nick about that.  Presently he is doing well on OTC preparations.  He also has a history of chronic back pain and does occasionally use a muscle relaxer and does take Celebrex on a daily basis.  His allergies seem to be under good control.  He does have a history of glucose intolerance.  He continues to do well on his CPAP and does get yearly follow-up on that with Dr. Brett Fairy.  He does have a history of hepatitis C but has had testing done and is viral load negative.  He does occasionally use Cialis for his ED.  Continues to do well on Effexor and is not interested in quitting.  He continues on losartan/HCTZ.  He is also taking Crestor and having no difficulty with that.  Approximately a month ago he became quite ill and did get testing for COVID which was negative but would like a follow-up antibody test.  Otherwise since family and social history as well as health maintenance was reviewed.   Immunizations and Health Maintenance  There are no preventive care reminders to display for this patient.  Last colonoscopy:2012 Last PSA: Dentist:Hanna Ophtho:shapiro Exercise:walking  Other doctors caring for patient include:Shapiro Dohmeier Alonza Bogus Advanced Directives:yes    Depression screen:  See questionnaire below.     Depression screen [facility] 2/9 12/13/2018 11/15/2017 10/26/2016 06/26/2015  Decreased Interest 0 0 0 0  Down, Depressed, Hopeless 0 0 0 0  PHQ - 2 Score 0 0 0 0    Fall Screen: See Questionaire below.   Fall Risk  11/15/2017 07/29/2017 10/26/2016 06/26/2015  Falls in the past year? No No No No    ADL  screen:  See questionnaire below.  Functional Status Survey:  neg   Review of Systems  Constitutional: -, -unexpected weight change, -anorexia, -fatigue Allergy: -sneezing, -itching, -congestion Dermatology: denies changing moles, rash, lumps ENT: -runny nose, -ear pain, -sore throat,  Cardiology:  -chest pain, -palpitations, -orthopnea, Respiratory: -cough, -shortness of breath, -dyspnea on exertion, -wheezing,  Gastroenterology: -abdominal pain, -nausea, -vomiting, -diarrhea, -constipation, -dysphagia Hematology: -bleeding or bruising problems Musculoskeletal: -arthralgias, -myalgias, -joint swelling, -back pain, - Ophthalmology: -vision changes,  Urology: -dysuria, -difficulty urinating,  -urinary frequency, -urgency, incontinence Neurology: -, -numbness, , -memory loss, -falls, -dizziness    PHYSICAL EXAM:  BP 122/80 (BP Location: Left Arm, Patient Position: Sitting)   Pulse 72   Temp (!) 96.9 F (36.1 C)   Ht 6' 1.75" (1.873 m)   Wt 231 lb 6.4 oz (105 kg)   SpO2 96%   BMI 29.91 kg/m   General Appearance: Alert, cooperative, no distress, appears stated age Head: Normocephalic, without obvious abnormality, atraumatic Eyes: PERRL, conjunctiva/corneas clear, EOM's intact, fundi benign Ears: Normal TM's and external ear canals Nose: Nares normal, mucosa normal, no drainage or sinus   tenderness Throat: Lips, mucosa, and tongue normal; teeth and gums normal Neck: Supple, no lymphadenopathy, thyroid:no enlargement/tenderness/nodules; no carotid bruit or JVD Lungs: Clear to auscultation bilaterally without wheezes, rales or ronchi; respirations unlabored Heart: Regular rate and rhythm, S1 and S2 normal, no murmur, rub or gallop Abdomen: Soft,  non-tender, nondistended, normoactive bowel sounds, no masses, no hepatosplenomegaly Extremities: No clubbing, cyanosis or edema Pulses: 2+ and symmetric all extremities Skin: Skin color, texture, turgor normal, no rashes or  lesions Lymph nodes: Cervical, supraclavicular, and axillary nodes normal Neurologic: CNII-XII intact, normal strength, sensation and gait; reflexes 2+ and symmetric throughout   Psych: Normal mood, affect, hygiene and grooming Hemoglobin A1c is 4.4 ASSESSMENT/PLAN: Routine general medical examination at a health care facility  Arthritis  Chronic allergic rhinitis  Chronic back pain, unspecified back location, unspecified back pain laterality  Glucose intolerance (impaired glucose tolerance) - Plan: HgB A1c  Hypersomnia with sleep apnea  Essential hypertension  Obstructive sleep apnea treated with continuous positive airway pressure (CPAP)  Hepatitis C antibody test positive  Hyperlipidemia with target low density lipoprotein (LDL) cholesterol less than 100 mg/dL  Erectile dysfunction, unspecified erectile dysfunction type  H/O dysthymia  Exposure to Covid-19 Virus - Plan: SAR CoV2 Serology (COVID 19)AB(IGG)IA  Screening for diabetes mellitus - Plan: HgB X7L  Umbilical hernia without obstruction and without gangrene - Plan: Ambulatory referral to General Surgery Continue on his present medication regimen.  Discussed the knee pain and need for possible injections versus replacement.  He plans to go slowly with this. I will refer him to general surgery as he would like to get the umbilical hernia taken care of. Encouraged him to continue to take care of himself. I will continue him on his present medication regimen.  He seems quite stable on this.   recommended at least 30 minutes of aerobic activity at least 5 days/week; proper sunscreen use reviewed; healthy diet and alcohol recommendations (less than or equal to 2 drinks/day) reviewed;  Immunization recommendations discussed.  Colonoscopy recommendations reviewed.   Medicare Attestation I have personally reviewed: The patient's medical and social history Their use of alcohol, tobacco or illicit drugs Their current  medications and supplements The patient's functional ability including ADLs,fall risks, home safety risks, cognitive, and hearing and visual impairment Diet and physical activities Evidence for depression or mood disorders  The patient's weight, height, and BMI have been recorded in the chart.  I have made referrals, counseling, and provided education to the patient based on review of the above and I have provided the patient with a written personalized care plan for preventive services.     Jill Alexanders, MD   12/13/2018

## 2018-12-13 NOTE — Addendum Note (Signed)
Addended by: Elyse Jarvis on: 12/13/2018 02:01 PM   Modules accepted: Orders

## 2018-12-14 LAB — SAR COV2 SEROLOGY (COVID19)AB(IGG),IA: SARS-CoV-2 Ab, IgG: NEGATIVE

## 2018-12-27 DIAGNOSIS — H524 Presbyopia: Secondary | ICD-10-CM | POA: Diagnosis not present

## 2018-12-27 DIAGNOSIS — Z961 Presence of intraocular lens: Secondary | ICD-10-CM | POA: Diagnosis not present

## 2018-12-27 DIAGNOSIS — H26492 Other secondary cataract, left eye: Secondary | ICD-10-CM | POA: Diagnosis not present

## 2019-01-11 DIAGNOSIS — H26492 Other secondary cataract, left eye: Secondary | ICD-10-CM | POA: Diagnosis not present

## 2019-01-19 DIAGNOSIS — G8929 Other chronic pain: Secondary | ICD-10-CM | POA: Diagnosis not present

## 2019-01-19 DIAGNOSIS — M7651 Patellar tendinitis, right knee: Secondary | ICD-10-CM | POA: Diagnosis not present

## 2019-01-19 DIAGNOSIS — M25561 Pain in right knee: Secondary | ICD-10-CM | POA: Diagnosis not present

## 2019-03-24 ENCOUNTER — Encounter: Payer: Self-pay | Admitting: Family Medicine

## 2019-03-28 ENCOUNTER — Other Ambulatory Visit: Payer: Self-pay | Admitting: Surgery

## 2019-03-28 DIAGNOSIS — K429 Umbilical hernia without obstruction or gangrene: Secondary | ICD-10-CM | POA: Diagnosis not present

## 2019-04-03 ENCOUNTER — Encounter: Payer: Self-pay | Admitting: Family Medicine

## 2019-04-04 DIAGNOSIS — G4733 Obstructive sleep apnea (adult) (pediatric): Secondary | ICD-10-CM | POA: Diagnosis not present

## 2019-04-10 ENCOUNTER — Encounter: Payer: Self-pay | Admitting: Family Medicine

## 2019-04-12 ENCOUNTER — Encounter (HOSPITAL_COMMUNITY): Payer: Self-pay

## 2019-04-12 NOTE — Patient Instructions (Addendum)
DUE TO COVID-19 ONLY ONE VISITOR IS ALLOWED TO COME WITH YOU AND STAY IN THE WAITING ROOM ONLY DURING PRE OP AND PROCEDURE DAY OF SURGERY. THE 1 VISITOR MAY VISIT WITH YOU AFTER SURGERY IN YOUR PRIVATE ROOM DURING VISITING HOURS ONLY!  YOU NEED TO HAVE A COVID 19 TEST ON_______ @_______ , THIS TEST MUST BE DONE BEFORE SURGERY, COME  Andrew Yang , [id number].  (Andrew Yang) ONCE YOUR COVID TEST IS COMPLETED, PLEASE BEGIN THE QUARANTINE INSTRUCTIONS AS OUTLINED IN YOUR HANDOUT.                Andrew Yang  04/12/2019   Your procedure is scheduled on:   04-26-19   Report to [facility] Main  Entrance   Report to admitting at               [place] AM     Call this number if you have problems the morning of surgery [phone]    Remember: Do not eat food or drink liquids :After Midnight. BRUSH YOUR TEETH MORNING OF SURGERY AND RINSE YOUR MOUTH OUT, NO CHEWING GUM CANDY OR MINTS.     Take these medicines the morning of surgery with A SIP OF WATER: effexor and crestor                                       You may not have any metal on your body including hair pins and              piercings  Do not wear jewelry, lotions, powders or perfumes, deodorant              Men may shave face and neck.   Do not bring valuables to the hospital. [place].  Contacts, dentures or bridgework may not be worn into surgery.      Patients discharged the day of surgery will not be allowed to drive home. IF YOU ARE HAVING SURGERY AND GOING HOME THE SAME DAY, YOU MUST HAVE AN ADULT TO DRIVE YOU HOME AND BE WITH YOU FOR 24 HOURS. YOU MAY GO HOME BY TAXI OR UBER OR ORTHERWISE, BUT AN ADULT MUST ACCOMPANY YOU HOME AND STAY WITH YOU FOR 24 HOURS.  Name and phone number of your driver:  Special Instructions: N/A              Please read over the following fact sheets you were  given: _____________________________________________________________________             [facility] - Preparing for Surgery Before surgery, you can play an important role.  Because skin is not sterile, your skin needs to be as free of germs as possible.  You can reduce the number of germs on your skin by washing with CHG (chlorahexidine gluconate) soap before surgery.  CHG is an antiseptic cleaner which kills germs and bonds with the skin to continue killing germs even after washing. Please DO NOT use if you have an allergy to CHG or antibacterial soaps.  If your skin becomes reddened/irritated stop using the CHG and inform your nurse when you arrive at Short Stay. Do not shave (including legs and underarms) for at least 48 hours prior to the first CHG shower.  You may shave  your face/neck. Please follow these instructions carefully:  1.  Shower with CHG Soap the night before surgery and the  morning of Surgery.  2.  If you choose to wash your hair, wash your hair first as usual with your  normal  shampoo.  3.  After you shampoo, rinse your hair and body thoroughly to remove the  shampoo.                           4.  Use CHG as you would any other liquid soap.  You can apply chg directly  to the skin and wash                       Gently with a scrungie or clean washcloth.  5.  Apply the CHG Soap to your body ONLY FROM THE NECK DOWN.   Do not use on face/ open                           Wound or open sores. Avoid contact with eyes, ears mouth and genitals (private parts).                       Wash face,  Genitals (private parts) with your normal soap.             6.  Wash thoroughly, paying special attention to the area where your surgery  will be performed.  7.  Thoroughly rinse your body with warm water from the neck down.  8.  DO NOT shower/wash with your normal soap after using and rinsing off  the CHG Soap.                9.  Pat yourself dry with a clean towel.            10.  Wear  clean pajamas.            11.  Place clean sheets on your bed the night of your first shower and do not  sleep with pets. Day of Surgery : Do not apply any lotions/deodorants the morning of surgery.  Please wear clean clothes to the hospital/surgery center.  FAILURE TO FOLLOW THESE INSTRUCTIONS MAY RESULT IN THE CANCELLATION OF YOUR SURGERY PATIENT SIGNATURE_________________________________  NURSE SIGNATURE__________________________________  ________________________________________________________________________

## 2019-04-13 DIAGNOSIS — G4733 Obstructive sleep apnea (adult) (pediatric): Secondary | ICD-10-CM | POA: Diagnosis not present

## 2019-04-18 ENCOUNTER — Ambulatory Visit
Admission: RE | Admit: 2019-04-18 | Discharge: 2019-04-18 | Disposition: A | Discharge status: Home / Self Care | Payer: Medicare HMO | Source: Ambulatory Visit | Attending: Family Medicine | Admitting: Family Medicine

## 2019-04-18 ENCOUNTER — Other Ambulatory Visit: Payer: Self-pay

## 2019-04-18 ENCOUNTER — Ambulatory Visit: Payer: Medicare HMO | Admitting: Family Medicine

## 2019-04-18 ENCOUNTER — Ambulatory Visit (INDEPENDENT_AMBULATORY_CARE_PROVIDER_SITE_OTHER): Payer: Medicare HMO | Admitting: Family Medicine

## 2019-04-18 ENCOUNTER — Encounter: Payer: Self-pay | Admitting: Family Medicine

## 2019-04-18 VITALS — BP 138/82 | HR 90 | Temp 97.5°F | Wt 235.6 lb

## 2019-04-18 DIAGNOSIS — M545 Low back pain: Secondary | ICD-10-CM | POA: Diagnosis not present

## 2019-04-18 DIAGNOSIS — Z23 Encounter for immunization: Secondary | ICD-10-CM | POA: Diagnosis not present

## 2019-04-18 DIAGNOSIS — M1990 Unspecified osteoarthritis, unspecified site: Secondary | ICD-10-CM

## 2019-04-18 DIAGNOSIS — M25551 Pain in right hip: Secondary | ICD-10-CM

## 2019-04-18 DIAGNOSIS — M1611 Unilateral primary osteoarthritis, right hip: Secondary | ICD-10-CM | POA: Diagnosis not present

## 2019-04-18 NOTE — Progress Notes (Signed)
   Subjective:    Patient ID: ZABIAN TRUCKS, male    DOB: [birth year], 68 y.o.   MRN: [id number]  HPI He is here for evaluation of a several month history of right sided low back pain.  This sometimes keeps him up at night.  Does get is worse when he stands.  He also will occasionally have left calf pain in the lateral area.  He has a previous history of mechanical low back pain that usually does well with physical therapy including stretching exercises.  No numbness, weakness or tingling.  He does have a remote history of femur fracture.   Review of Systems     Objective:   Physical Exam Normal lumbar motion and curve.  No tenderness over SI joint however slight tenderness over the sciatic notch area.  Normal hip motion.  DTRs are brisk.  he does have 2 beat clonus bilaterally.        Assessment & Plan:  Right hip pain - Plan: DG Lumbar Spine Complete, DG Hip Unilat W OR W/O Pelvis 2-3 Views Right  Arthritis - Plan: DG Lumbar Spine Complete  Need for influenza vaccination - Plan: Flu Vaccine QUAD High Dose(Fluad) I explained that I thought this was mostly musculoskeletal in origin possibly a piriformis problem.  If the x-ray is negative, I will refer him to physical therapy and if continued difficulty refer for ultrasound of this area.  He was comfortable with that.

## 2019-04-19 ENCOUNTER — Encounter (HOSPITAL_COMMUNITY)
Admission: RE | Admit: 2019-04-19 | Discharge: 2019-04-19 | Disposition: A | Discharge status: Home / Self Care | Payer: Medicare HMO | Source: Ambulatory Visit | Attending: Surgery | Admitting: Surgery

## 2019-04-19 ENCOUNTER — Encounter: Payer: Self-pay | Admitting: Family Medicine

## 2019-04-19 ENCOUNTER — Other Ambulatory Visit: Payer: Self-pay

## 2019-04-19 ENCOUNTER — Encounter (HOSPITAL_COMMUNITY): Payer: Self-pay

## 2019-04-19 DIAGNOSIS — Z01812 Encounter for preprocedural laboratory examination: Secondary | ICD-10-CM | POA: Diagnosis not present

## 2019-04-19 DIAGNOSIS — Z01810 Encounter for preprocedural cardiovascular examination: Secondary | ICD-10-CM | POA: Diagnosis not present

## 2019-04-19 DIAGNOSIS — M1990 Unspecified osteoarthritis, unspecified site: Secondary | ICD-10-CM

## 2019-04-19 DIAGNOSIS — Z20828 Contact with and (suspected) exposure to other viral communicable diseases: Secondary | ICD-10-CM | POA: Insufficient documentation

## 2019-04-19 HISTORY — DX: Pneumonia, unspecified organism: J18.9

## 2019-04-19 HISTORY — DX: Essential (primary) hypertension: I10

## 2019-04-19 LAB — COMPREHENSIVE METABOLIC PANEL
ALT: 31 U/L (ref 0–44)
AST: 25 U/L (ref 15–41)
Albumin: 4.5 g/dL (ref 3.5–5.0)
Alkaline Phosphatase: 73 U/L (ref 38–126)
Anion gap: 6 (ref 5–15)
BUN: 14 mg/dL (ref 8–23)
CO2: 29 mmol/L (ref 22–32)
Calcium: 9.4 mg/dL (ref 8.9–10.3)
Chloride: 104 mmol/L (ref 98–111)
Creatinine, Ser: 1.18 mg/dL (ref 0.61–1.24)
GFR calc Af Amer: >60 mL/min (ref >60)
GFR calc non Af Amer: >60 mL/min (ref >60)
Glucose, Bld: 103 mg/dL — ABNORMAL HIGH (ref 70–99)
Potassium: 4.6 mmol/L (ref 3.5–5.1)
Sodium: 139 mmol/L (ref 135–145)
Total Bilirubin: 0.7 mg/dL (ref 0.3–1.2)
Total Protein: 7.5 g/dL (ref 6.5–8.1)

## 2019-04-19 LAB — CBC
HCT: 43.9 % (ref 39.0–52.0)
Hemoglobin: 14.5 g/dL (ref 13.0–17.0)
MCH: 31.1 pg (ref 26.0–34.0)
MCHC: 33 g/dL (ref 30.0–36.0)
MCV: 94.2 fL (ref 80.0–100.0)
Platelets: 183 10*3/uL (ref 150–400)
RBC: 4.66 MIL/uL (ref 4.22–5.81)
RDW: 13.5 % (ref 11.5–15.5)
WBC: 6.9 10*3/uL (ref 4.0–10.5)
nRBC: 0 % (ref 0.0–0.2)

## 2019-04-19 NOTE — Progress Notes (Signed)
PCP - Jill Alexanders Cardiologist -   Chest x-ray -  EKG -  Stress Test -  ECHO -  Cardiac Cath -   Sleep Study -  CPAP -   Fasting Blood Sugar -  Checks Blood Sugar _____ times a day  Blood Thinner Instructions: Aspirin Instructions:  Anesthesia review:   Patient denies shortness of breath, fever, cough and chest pain at PAT appointment   Patient verbalized understanding of instructions that were given to them at the PAT appointment. Patient was also instructed that they will need to review over the PAT instructions again at home before surgery.

## 2019-04-20 DIAGNOSIS — I700 Atherosclerosis of aorta: Secondary | ICD-10-CM | POA: Insufficient documentation

## 2019-04-22 ENCOUNTER — Other Ambulatory Visit (HOSPITAL_COMMUNITY)
Admission: RE | Admit: 2019-04-22 | Discharge: 2019-04-22 | Disposition: A | Discharge status: Home / Self Care | Payer: Medicare HMO | Source: Ambulatory Visit | Attending: Surgery | Admitting: Surgery

## 2019-04-22 DIAGNOSIS — Z01812 Encounter for preprocedural laboratory examination: Secondary | ICD-10-CM | POA: Diagnosis not present

## 2019-04-22 DIAGNOSIS — Z20828 Contact with and (suspected) exposure to other viral communicable diseases: Secondary | ICD-10-CM | POA: Diagnosis not present

## 2019-04-22 DIAGNOSIS — Z01810 Encounter for preprocedural cardiovascular examination: Secondary | ICD-10-CM | POA: Diagnosis not present

## 2019-04-23 LAB — NOVEL CORONAVIRUS, NAA (HOSP ORDER, SEND-OUT TO REF LAB; TAT 18-24 HRS): SARS-CoV-2, NAA: NOT DETECTED

## 2019-04-25 DIAGNOSIS — M5416 Radiculopathy, lumbar region: Secondary | ICD-10-CM | POA: Diagnosis not present

## 2019-04-25 NOTE — H&P (Signed)
Brunetta Genera Documented: 03/28/2019 10:34 AM Location: [facility] Patient #: [id number] DOB: January 12, 1951 Married / Language: Cleophus Molt / Race: White Male   History of Present Illness Nathaneil Canary A. Ninfa Linden MD; 03/28/2019 10:53 AM) The patient is a 68 year old male who presents with an umbilical hernia. This gentleman is referred by Dr. Jill Alexanders for a symptomatic umbilical hernia. The patient reports that he has had the hernia for several years but is now any larger and causing some discomfort especially with activity. He has had no obstructive symptoms including nausea and vomiting. The pain is described as a dull discomfort. He is otherwise without complaints.   Past Surgical History (Tanisha A. Owens Shark, [place]; 03/28/2019 10:34 AM) Colon Polyp Removal - Colonoscopy  Hemorrhoidectomy  Hip Surgery  Right. Knee Surgery  Left. Oral Surgery   Diagnostic Studies History (Tanisha A. Owens Shark, [place]; 03/28/2019 10:34 AM) Colonoscopy  5-10 years ago  Allergies (Tanisha A. Owens Shark, [place]; 03/28/2019 10:35 AM) No Known Drug Allergies  [03/28/2019]: Allergies Reconciled   Medication History (Tanisha A. Owens Shark, [place]; 03/28/2019 10:36 AM) Azithromycin (250MG  Tablet, Oral) Active. Benzonatate (200MG  Capsule, Oral) Active. Cialis (20MG  Tablet, Oral) Active. Butalbital-Acetaminophen (50-325MG  Tablet, Oral) Active. Medications Reconciled  Social History (Tanisha A. Owens Shark, [place]; 03/28/2019 10:34 AM) Alcohol use  Moderate alcohol use. Caffeine use  Coffee, Tea. Illicit drug use  Prefer to discuss with provider. Tobacco use  Former smoker.  Family History (Tanisha A. Owens Shark, [place]; 03/28/2019 10:34 AM) Arthritis  Mother. Cerebrovascular Accident  Father. Depression  Daughter, Mother. Hypertension  Mother.  Other Problems (Tanisha A. Owens Shark, [place]; 03/28/2019 10:34 AM) Anxiety Disorder  Back Pain  Hepatitis  High blood pressure  Hypercholesterolemia  Sleep Apnea   Transfusion history  Umbilical Hernia Repair     Review of Systems (Tanisha A. Brown RMA; 03/28/2019 10:34 AM) General Not Present- Appetite Loss, Chills, Fatigue, Fever, Night Sweats, Weight Gain and Weight Loss. Skin Not Present- Change in Wart/Mole, Dryness, Hives, Jaundice, New Lesions, Non-Healing Wounds, Rash and Ulcer. HEENT Present- Wears glasses/contact lenses. Not Present- Earache, Hearing Loss, Hoarseness, Nose Bleed, Oral Ulcers, Ringing in the Ears, Seasonal Allergies, Sinus Pain, Sore Throat, Visual Disturbances and Yellow Eyes. Respiratory Not Present- Bloody sputum, Chronic Cough, Difficulty Breathing, Snoring and Wheezing. Breast Not Present- Breast Mass, Breast Pain, Nipple Discharge and Skin Changes. Cardiovascular Not Present- Chest Pain, Difficulty Breathing Lying Down, Leg Cramps, Palpitations, Rapid Heart Rate, Shortness of Breath and Swelling of Extremities. Gastrointestinal Not Present- Abdominal Pain, Bloating, Bloody Stool, Change in Bowel Habits, Chronic diarrhea, Constipation, Difficulty Swallowing, Excessive gas, Gets full quickly at meals, Hemorrhoids, Indigestion, Nausea, Rectal Pain and Vomiting. Male Genitourinary Not Present- Blood in Urine, Change in Urinary Stream, Frequency, Impotence, Nocturia, Painful Urination, Urgency and Urine Leakage. Musculoskeletal Not Present- Back Pain, Joint Pain, Joint Stiffness, Muscle Pain, Muscle Weakness and Swelling of Extremities. Neurological Not Present- Decreased Memory, Fainting, Headaches, Numbness, Seizures, Tingling, Tremor, Trouble walking and Weakness. Psychiatric Not Present- Anxiety, Bipolar, Change in Sleep Pattern, Depression, Fearful and Frequent crying. Endocrine Not Present- Cold Intolerance, Excessive Hunger, Hair Changes, Heat Intolerance, Hot flashes and New Diabetes. Hematology Present- Blood Thinners. Not Present- Easy Bruising, Excessive bleeding, Gland problems, HIV and Persistent  Infections.  Vitals (Tanisha A. Brown RMA; 03/28/2019 10:34 AM) 03/28/2019 10:34 AM Weight: 234.6 lb Height: 73in Body Surface Area: 2.3 m Body Mass Index: 30.95 kg/m  Temp.: 98.57F  Pulse: 81 (Regular)  BP: 128/82(Sitting, Left Arm, Standard)  Physical Exam (Hyden Soley A. Ninfa Linden MD; 03/28/2019 10:54 AM) General Mental Status-Alert. General Appearance-Consistent with stated age. Hydration-Well hydrated. Voice-Normal.  Head and Neck Head-normocephalic, atraumatic with no lesions or palpable masses. Trachea-midline.  Eye Eyeball - Bilateral-Extraocular movements intact. Sclera/Conjunctiva - Bilateral-No scleral icterus.  Chest and Lung Exam Chest and lung exam reveals -quiet, even and easy respiratory effort with no use of accessory muscles and on auscultation, normal breath sounds, no adventitious sounds and normal vocal resonance. Inspection Chest Wall - Normal. Back - normal.  Cardiovascular Cardiovascular examination reveals -normal heart sounds, regular rate and rhythm with no murmurs and normal pedal pulses bilaterally.  Abdomen Inspection Skin - Scar - no surgical scars. Hernias - Umbilical hernia - Reducible. Palpation/Percussion Palpation and Percussion of the abdomen reveal - Soft, Non Tender, No Rebound tenderness, No Rigidity (guarding) and No hepatosplenomegaly. Auscultation Auscultation of the abdomen reveals - Bowel sounds normal.  Neurologic - Did not examine.  Musculoskeletal - Did not examine.    Assessment & Plan (Miara Emminger A. Ninfa Linden MD; [id number] AM)  UMBILICAL HERNIA ([id number]) Impression: I discussed the diagnosis of an umbilical hernia with the patient in detail. We discussed the reasons for umbilical hernia repair. We discussed the use of mesh with hernia repair as well. We discussed the surgical procedure. I discussed the risk which includes but is not limited to bleeding, infection, hernia recurrence,  use of mesh, postoperative recovery, cardiopulmonary issues, etc. He understands and wishes to proceed with surgery

## 2019-04-25 NOTE — Anesthesia Preprocedure Evaluation (Addendum)
Anesthesia Evaluation  Patient identified by MRN, date of birth, ID band Patient awake    Reviewed: Allergy & Precautions, NPO status , Patient's Chart, lab work & pertinent test results  Airway Mallampati: II  TM Distance: >3 FB Neck ROM: Full    Dental no notable dental hx.    Pulmonary sleep apnea and Continuous Positive Airway Pressure Ventilation , former smoker,    Pulmonary exam normal breath sounds clear to auscultation       Cardiovascular hypertension, Pt. on medications negative cardio ROS Normal cardiovascular exam Rhythm:Regular Rate:Normal     Neuro/Psych negative neurological ROS  negative psych ROS   GI/Hepatic negative GI ROS, (+)     substance abuse  alcohol use, Hepatitis -, C15 drinks/wk HCV treated- undetectable viral load   Endo/Other  negative endocrine ROS  Renal/GU negative Renal ROS     Musculoskeletal  (+) Arthritis , Osteoarthritis,    Abdominal   Peds  Hematology negative hematology ROS (+)   Anesthesia Other Findings Day of surgery medications reviewed with the patient.  Reproductive/Obstetrics                            Anesthesia Physical Anesthesia Plan  ASA: II  Anesthesia Plan: General   Post-op Pain Management:    Induction: Intravenous  PONV Risk Score and Plan: 2 and Ondansetron, Dexamethasone and Treatment may vary due to age or medical condition  Airway Management Planned: Oral ETT  Additional Equipment: None  Intra-op Plan:   Post-operative Plan: Extubation in OR  Informed Consent: I have reviewed the patients History and Physical, chart, labs and discussed the procedure including the risks, benefits and alternatives for the proposed anesthesia with the patient or authorized representative who has indicated his/her understanding and acceptance.     Dental advisory given  Plan Discussed with: CRNA  Anesthesia Plan Comments:          Anesthesia Quick Evaluation

## 2019-04-26 ENCOUNTER — Encounter (HOSPITAL_COMMUNITY)
Admission: RE | Disposition: A | Discharge status: Home / Self Care | Payer: Self-pay | Source: Home / Self Care | Attending: Surgery

## 2019-04-26 ENCOUNTER — Ambulatory Visit (HOSPITAL_COMMUNITY): Payer: Medicare HMO | Admitting: Physician Assistant

## 2019-04-26 ENCOUNTER — Other Ambulatory Visit: Payer: Self-pay

## 2019-04-26 ENCOUNTER — Ambulatory Visit (HOSPITAL_COMMUNITY): Payer: Medicare HMO | Admitting: Anesthesiology

## 2019-04-26 ENCOUNTER — Ambulatory Visit (HOSPITAL_COMMUNITY)
Admission: RE | Admit: 2019-04-26 | Discharge: 2019-04-26 | Disposition: A | Discharge status: Home / Self Care | Payer: Medicare HMO | Attending: Surgery | Admitting: Surgery

## 2019-04-26 ENCOUNTER — Encounter (HOSPITAL_COMMUNITY): Payer: Self-pay | Admitting: *Deleted

## 2019-04-26 DIAGNOSIS — Z888 Allergy status to other drugs, medicaments and biological substances status: Secondary | ICD-10-CM | POA: Diagnosis not present

## 2019-04-26 DIAGNOSIS — G4730 Sleep apnea, unspecified: Secondary | ICD-10-CM | POA: Diagnosis not present

## 2019-04-26 DIAGNOSIS — Z792 Long term (current) use of antibiotics: Secondary | ICD-10-CM | POA: Diagnosis not present

## 2019-04-26 DIAGNOSIS — I10 Essential (primary) hypertension: Secondary | ICD-10-CM | POA: Diagnosis not present

## 2019-04-26 DIAGNOSIS — G4733 Obstructive sleep apnea (adult) (pediatric): Secondary | ICD-10-CM | POA: Diagnosis not present

## 2019-04-26 DIAGNOSIS — Z87891 Personal history of nicotine dependence: Secondary | ICD-10-CM | POA: Diagnosis not present

## 2019-04-26 DIAGNOSIS — Z79899 Other long term (current) drug therapy: Secondary | ICD-10-CM | POA: Insufficient documentation

## 2019-04-26 DIAGNOSIS — M1990 Unspecified osteoarthritis, unspecified site: Secondary | ICD-10-CM | POA: Insufficient documentation

## 2019-04-26 DIAGNOSIS — K429 Umbilical hernia without obstruction or gangrene: Secondary | ICD-10-CM | POA: Insufficient documentation

## 2019-04-26 DIAGNOSIS — Z9989 Dependence on other enabling machines and devices: Secondary | ICD-10-CM | POA: Diagnosis not present

## 2019-04-26 DIAGNOSIS — E7800 Pure hypercholesterolemia, unspecified: Secondary | ICD-10-CM | POA: Diagnosis not present

## 2019-04-26 HISTORY — PX: UMBILICAL HERNIA REPAIR: SHX196

## 2019-04-26 SURGERY — REPAIR, HERNIA, UMBILICAL, ADULT
Anesthesia: General | Site: Abdomen

## 2019-04-26 MED ORDER — ACETAMINOPHEN 500 MG PO TABS
1000.0000 mg | ORAL_TABLET | ORAL | Status: AC
  Administered 2019-04-26: 07:00:00 1000 mg via ORAL

## 2019-04-26 MED ORDER — PROMETHAZINE HCL 25 MG/ML IJ SOLN: 6.2500 mg | INTRAMUSCULAR | Status: DC | PRN

## 2019-04-26 MED ORDER — OXYCODONE HCL 5 MG PO TABS: 5.0000 mg | ORAL_TABLET | Freq: Four times a day (QID) | ORAL | 0 refills | Status: DC | PRN

## 2019-04-26 MED ORDER — ROCURONIUM BROMIDE 10 MG/ML (PF) SYRINGE
PREFILLED_SYRINGE | INTRAVENOUS | Status: AC
  Filled 2019-04-26: qty 10

## 2019-04-26 MED ORDER — SUCCINYLCHOLINE CHLORIDE 200 MG/10ML IV SOSY
PREFILLED_SYRINGE | INTRAVENOUS | Status: AC
  Filled 2019-04-26: qty 10

## 2019-04-26 MED ORDER — ACETAMINOPHEN 500 MG PO TABS: 1000.0000 mg | ORAL_TABLET | Freq: Once | ORAL | Status: DC

## 2019-04-26 MED ORDER — DEXAMETHASONE SODIUM PHOSPHATE 10 MG/ML IJ SOLN
INTRAMUSCULAR | Status: DC | PRN
  Administered 2019-04-26: 10 mg via INTRAVENOUS

## 2019-04-26 MED ORDER — EPHEDRINE 5 MG/ML INJ
INTRAVENOUS | Status: AC
  Filled 2019-04-26: qty 10

## 2019-04-26 MED ORDER — FENTANYL CITRATE (PF) 100 MCG/2ML IJ SOLN
INTRAMUSCULAR | Status: DC | PRN
  Administered 2019-04-26: 100 ug via INTRAVENOUS

## 2019-04-26 MED ORDER — HYDROMORPHONE HCL 1 MG/ML IJ SOLN: 0.2500 mg | INTRAMUSCULAR | Status: DC | PRN

## 2019-04-26 MED ORDER — OXYCODONE HCL 5 MG PO TABS: 5.0000 mg | ORAL_TABLET | Freq: Once | ORAL | Status: DC | PRN

## 2019-04-26 MED ORDER — MIDAZOLAM HCL 2 MG/2ML IJ SOLN
INTRAMUSCULAR | Status: AC
  Filled 2019-04-26: qty 2

## 2019-04-26 MED ORDER — CEFAZOLIN SODIUM-DEXTROSE 2-4 GM/100ML-% IV SOLN
INTRAVENOUS | Status: AC
  Filled 2019-04-26: qty 100

## 2019-04-26 MED ORDER — BUPIVACAINE HCL (PF) 0.5 % IJ SOLN
INTRAMUSCULAR | Status: AC
  Filled 2019-04-26: qty 30

## 2019-04-26 MED ORDER — BUPIVACAINE HCL (PF) 0.5 % IJ SOLN
INTRAMUSCULAR | Status: DC | PRN
  Administered 2019-04-26: 20 mL

## 2019-04-26 MED ORDER — EPHEDRINE SULFATE-NACL 50-0.9 MG/10ML-% IV SOSY
PREFILLED_SYRINGE | INTRAVENOUS | Status: DC | PRN
  Administered 2019-04-26: 10 mg via INTRAVENOUS

## 2019-04-26 MED ORDER — CHLORHEXIDINE GLUCONATE CLOTH 2 % EX PADS: 6.0000 | MEDICATED_PAD | Freq: Once | CUTANEOUS | Status: DC

## 2019-04-26 MED ORDER — LACTATED RINGERS IV SOLN
INTRAVENOUS | Status: DC
  Administered 2019-04-26: 07:00:00 via INTRAVENOUS

## 2019-04-26 MED ORDER — FENTANYL CITRATE (PF) 250 MCG/5ML IJ SOLN
INTRAMUSCULAR | Status: AC
  Filled 2019-04-26: qty 5

## 2019-04-26 MED ORDER — ACETAMINOPHEN 500 MG PO TABS
ORAL_TABLET | ORAL | Status: AC
  Administered 2019-04-26: 1000 mg via ORAL
  Filled 2019-04-26: qty 2

## 2019-04-26 MED ORDER — KETOROLAC TROMETHAMINE 30 MG/ML IJ SOLN: 30.0000 mg | Freq: Once | INTRAMUSCULAR | Status: DC | PRN

## 2019-04-26 MED ORDER — MIDAZOLAM HCL 5 MG/5ML IJ SOLN
INTRAMUSCULAR | Status: DC | PRN
  Administered 2019-04-26: 2 mg via INTRAVENOUS

## 2019-04-26 MED ORDER — PROPOFOL 10 MG/ML IV BOLUS
INTRAVENOUS | Status: DC | PRN
  Administered 2019-04-26: 160 mg via INTRAVENOUS

## 2019-04-26 MED ORDER — GABAPENTIN 300 MG PO CAPS
ORAL_CAPSULE | ORAL | Status: AC
  Administered 2019-04-26: 300 mg via ORAL
  Filled 2019-04-26: qty 1

## 2019-04-26 MED ORDER — DEXAMETHASONE SODIUM PHOSPHATE 10 MG/ML IJ SOLN
INTRAMUSCULAR | Status: AC
  Filled 2019-04-26: qty 1

## 2019-04-26 MED ORDER — CEFAZOLIN SODIUM-DEXTROSE 2-4 GM/100ML-% IV SOLN
2.0000 g | INTRAVENOUS | Status: AC
  Administered 2019-04-26: 2 g via INTRAVENOUS

## 2019-04-26 MED ORDER — GABAPENTIN 300 MG PO CAPS
300.0000 mg | ORAL_CAPSULE | ORAL | Status: AC
  Administered 2019-04-26: 07:00:00 300 mg via ORAL

## 2019-04-26 MED ORDER — PROPOFOL 10 MG/ML IV BOLUS
INTRAVENOUS | Status: AC
  Filled 2019-04-26: qty 20

## 2019-04-26 MED ORDER — OXYCODONE HCL 5 MG/5ML PO SOLN: 5.0000 mg | Freq: Once | ORAL | Status: DC | PRN

## 2019-04-26 MED ORDER — ONDANSETRON HCL 4 MG/2ML IJ SOLN
INTRAMUSCULAR | Status: AC
  Filled 2019-04-26: qty 2

## 2019-04-26 MED ORDER — ONDANSETRON HCL 4 MG/2ML IJ SOLN
INTRAMUSCULAR | Status: DC | PRN
  Administered 2019-04-26: 4 mg via INTRAVENOUS

## 2019-04-26 MED ORDER — LIDOCAINE 2% (20 MG/ML) 5 ML SYRINGE
INTRAMUSCULAR | Status: DC | PRN
  Administered 2019-04-26: 100 mg via INTRAVENOUS

## 2019-04-26 MED ORDER — LIDOCAINE 2% (20 MG/ML) 5 ML SYRINGE
INTRAMUSCULAR | Status: AC
  Filled 2019-04-26: qty 5

## 2019-04-26 SURGICAL SUPPLY — 30 items
ADH SKN CLS APL DERMABOND .7 (GAUZE/BANDAGES/DRESSINGS) ×1
APL PRP STRL LF DISP 70% ISPRP (MISCELLANEOUS) ×1
BINDER ABDOMINAL 12 ML 46-62 (SOFTGOODS) IMPLANT
BLADE SURG 15 STRL LF DISP TIS (BLADE) ×1 IMPLANT
BLADE SURG 15 STRL SS (BLADE) ×2
CHLORAPREP W/TINT 26 (MISCELLANEOUS) ×2 IMPLANT
COVER WAND RF STERILE (DRAPES) IMPLANT
DECANTER SPIKE VIAL GLASS SM (MISCELLANEOUS) IMPLANT
DERMABOND ADVANCED (GAUZE/BANDAGES/DRESSINGS) ×1
DERMABOND ADVANCED .7 DNX12 (GAUZE/BANDAGES/DRESSINGS) ×1 IMPLANT
DRAPE LAPAROSCOPIC ABDOMINAL (DRAPES) ×2 IMPLANT
ELECT PENCIL ROCKER SW 15FT (MISCELLANEOUS) ×2 IMPLANT
ELECT REM PT RETURN 15FT ADLT (MISCELLANEOUS) ×2 IMPLANT
GAUZE SPONGE 4X4 12PLY STRL (GAUZE/BANDAGES/DRESSINGS) ×1 IMPLANT
GOWN STRL REUS W/TWL XL LVL3 (GOWN DISPOSABLE) ×4 IMPLANT
KIT BASIN OR (CUSTOM PROCEDURE TRAY) ×2 IMPLANT
KIT TURNOVER KIT A (KITS) IMPLANT
MESH VENTRALEX ST 1-7/10 CRC S (Mesh General) ×1 IMPLANT
NEEDLE HYPO 22GX1.5 SAFETY (NEEDLE) ×2 IMPLANT
PACK BASIC VI WITH GOWN DISP (CUSTOM PROCEDURE TRAY) ×2 IMPLANT
SPONGE LAP 18X18 RF (DISPOSABLE) ×2 IMPLANT
SUT MNCRL AB 4-0 PS2 18 (SUTURE) ×2 IMPLANT
SUT NOVA NAB DX-16 0-1 5-0 T12 (SUTURE) ×2 IMPLANT
SUT VIC AB 3-0 SH 27 (SUTURE) ×2
SUT VIC AB 3-0 SH 27X BRD (SUTURE) ×1 IMPLANT
SUT VICRYL 2 0 18  UND BR (SUTURE)
SUT VICRYL 2 0 18 UND BR (SUTURE) IMPLANT
SYR 20ML LL LF (SYRINGE) ×2 IMPLANT
TOWEL OR 17X26 10 PK STRL BLUE (TOWEL DISPOSABLE) ×2 IMPLANT
TOWEL OR NON WOVEN STRL DISP B (DISPOSABLE) ×2 IMPLANT

## 2019-04-26 NOTE — Interval H&P Note (Signed)
History and Physical Interval Note: no change in H and P  04/26/2019 8:11 AM  Andrew Yang  has presented today for surgery, with the diagnosis of UMBILICAL HERNIA.  The various methods of treatment have been discussed with the patient and family. After consideration of risks, benefits and other options for treatment, the patient has consented to  Procedure(s): UMBILICAL HERNIA REPAIR WITH MESH (N/A) as a surgical intervention.  The patient's history has been reviewed, patient examined, no change in status, stable for surgery.  I have reviewed the patient's chart and labs.  Questions were answered to the patient's satisfaction.     Coralie Keens

## 2019-04-26 NOTE — Anesthesia Postprocedure Evaluation (Signed)
Anesthesia Post Note  Patient: Andrew Yang  Procedure(s) Performed: UMBILICAL HERNIA REPAIR WITH MESH (N/A Abdomen)     Patient location during evaluation: PACU Anesthesia Type: General Level of consciousness: awake and alert Pain management: pain level controlled Vital Signs Assessment: post-procedure vital signs reviewed and stable Respiratory status: spontaneous breathing, nonlabored ventilation, respiratory function stable and patient connected to nasal cannula oxygen Cardiovascular status: blood pressure returned to baseline and stable Postop Assessment: no apparent nausea or vomiting Anesthetic complications: no    Last Vitals:  Vitals:   04/26/19 0941 04/26/19 1059  BP:  (!) 158/100  Pulse:  68  Resp:  18  Temp: 36.9 C (!) 36.1 C  SpO2:  96%    Last Pain:  Vitals:   04/26/19 1059  TempSrc: Axillary  PainSc: 0-No pain                 Pervis Hocking

## 2019-04-26 NOTE — Transfer of Care (Signed)
Immediate Anesthesia Transfer of Care Note  Patient: Andrew Yang  Procedure(s) Performed: UMBILICAL HERNIA REPAIR WITH MESH (N/A Abdomen)  Patient Location: PACU  Anesthesia Type:General  Level of Consciousness: awake, alert  and oriented  Airway & Oxygen Therapy: Patient Spontanous Breathing and Patient connected to face mask oxygen  Post-op Assessment: Report given to RN and Post -op Vital signs reviewed and stable  Post vital signs: Reviewed and stable  Last Vitals:  Vitals Value Taken Time  BP 156/94 04/26/19 0912  Temp    Pulse 78 04/26/19 0913  Resp 14 04/26/19 0913  SpO2 98 % 04/26/19 0913  Vitals shown include unvalidated device data.  Last Pain:  Vitals:   04/26/19 0711  TempSrc: Oral      Patients Stated Pain Goal: 4 ([id number] [id number])  Complications: No apparent anesthesia complications

## 2019-04-26 NOTE — Anesthesia Procedure Notes (Signed)
Procedure Name: LMA Insertion Date/Time: 04/26/2019 8:32 AM Performed by: Maxwell Caul, CRNA Pre-anesthesia Checklist: Patient identified, Emergency Drugs available, Suction available, Patient being monitored and Timeout performed Patient Re-evaluated:Patient Re-evaluated prior to induction Oxygen Delivery Method: Circle system utilized Preoxygenation: Pre-oxygenation with 100% oxygen Induction Type: IV induction LMA: LMA with gastric port inserted LMA Size: 4.0 Placement Confirmation: positive ETCO2 and breath sounds checked- equal and bilateral Tube secured with: Tape Dental Injury: Teeth and Oropharynx as per pre-operative assessment  Comments: LMA #5 placed with poor seating, removed and LMA #4 with gastric port placed.

## 2019-04-26 NOTE — Op Note (Signed)
UMBILICAL HERNIA REPAIR WITH MESH  Procedure Note  Andrew Yang 04/26/2019   Pre-op Diagnosis: UMBILICAL HERNIA     Post-op Diagnosis: same  Procedure(s): UMBILICAL HERNIA REPAIR WITH MESH  Surgeon(s): Coralie Keens, MD  Anesthesia: General  Staff:  Circulator: Vickey Huger, RN Scrub Person: Caprice Beaver, April C, CST  Estimated Blood Loss: Minimal               Findings: The patient was found to have a slightly more than 1 cm fascial defect at the umbilicus.  The hernia was repaired with a 4.3 cm round Prolene ventral patch from [facility]  Procedure: The patient was brought to the operating room and identifies correct patient.  He was placed upon on the operating table and general anesthesia was induced.  His abdomen was then prepped and draped in usual sterile fashion.  I anesthetized the skin of the lower edge of the umbilicus with Marcaine.  I then made a curvilinear incision with a scalpel.  I dissected down to the subcutaneous tissues and separated the hernia sac from the overlying umbilical skin with the cautery.  I then opened up the sac.  All contents have been reduced.  I excised the sac in its entirety going down to the fascia.  The fascial defect was slightly more than 1 cm in size.  There were no attachments of the surrounding peritoneal surface.  A 4.3 cm ventral patch was brought onto the field.  I placed the patch through the fascial opening and then pulled it up against the peritoneum with the ties.  I then sutured the mesh in place with interrupted #1 Novafil sutures.  The ties were cut and then I closed the fascia over the top of the mesh with a figure-of-eight #1 Novafil suture.  I then anesthetized the surrounding fascia and subcutaneous tissue further with Marcaine.  I tacked the umbilical skin back in place with 3-0 Vicryl sutures.  I then closed the subcutaneous tissue with interrupted 3-0 Vicryl sutures and closed the skin with a running 4-0 Monocryl.  Dermabond was  then applied.  The patient tolerated the procedure well.  All counts were correct at the end of the procedure.  The patient was then extubated in the operating room and taken in a stable condition to the recovery room.          Coralie Keens   Date: 04/26/2019  Time: 9:09 AM

## 2019-04-26 NOTE — Discharge Instructions (Signed)
[facility]  UMBILICAL OR INGUINAL HERNIA REPAIR: POST OP INSTRUCTIONS  Always review your discharge instruction sheet given to you by the facility where your surgery was performed. IF YOU HAVE DISABILITY OR FAMILY LEAVE FORMS, YOU MUST BRING THEM TO THE OFFICE FOR PROCESSING.   DO NOT GIVE THEM TO YOUR DOCTOR.  1. A  prescription for pain medication may be given to you upon discharge.  Take your pain medication as prescribed, if needed.  If narcotic pain medicine is not needed, then you may take acetaminophen (Tylenol) or ibuprofen (Advil) as needed. 2. Take your usually prescribed medications unless otherwise directed. If you need a refill on your pain medication, please contact your pharmacy.  They will contact our office to request authorization. Prescriptions will not be filled after 5 pm or on week-ends. 3. You should follow a light diet the first 24 hours after arrival home, such as soup and crackers, etc.  Be sure to include lots of fluids daily.  Resume your normal diet the day after surgery. 4.Most patients will experience some swelling and bruising around the umbilicus or in the groin and scrotum.  Ice packs and reclining will help.  Swelling and bruising can take several days to resolve.  6. It is common to experience some constipation if taking pain medication after surgery.  Increasing fluid intake and taking a stool softener (such as Colace) will usually help or prevent this problem from occurring.  A mild laxative (Milk of Magnesia or Miralax) should be taken according to package directions if there are no bowel movements after 48 hours. 7. Unless discharge instructions indicate otherwise, you may remove your bandages 24-48 hours after surgery, and you may shower at that time.  You may have steri-strips (small skin tapes) in place directly over the incision.  These strips should be left on the skin for 7-10 days.  If your surgeon used skin glue on the  incision, you may shower in 24 hours.  The glue will flake off over the next 2-3 weeks.  Any sutures or staples will be removed at the office during your follow-up visit. 8. ACTIVITIES:  You may resume regular (light) daily activities beginning the next day--such as daily self-care, walking, climbing stairs--gradually increasing activities as tolerated.  You may have sexual intercourse when it is comfortable.  Refrain from any heavy lifting or straining until approved by your doctor.  a.You may drive when you are no longer taking prescription pain medication, you can comfortably wear a seatbelt, and you can safely maneuver your car and apply brakes. b.RETURN TO WORK:   _____________________________________________  9.You should see your doctor in the office for a follow-up appointment approximately 2-3 weeks after your surgery.  Make sure that you call for this appointment within a day or two after you arrive home to insure a convenient appointment time. 10.OTHER INSTRUCTIONS: _OK TO SHOWER STARTING TOMORROW ICE PACK, TYLENOL, AND IBUPROFEN ALSO FOR PAIN NO LIFTING MORE THAN 15 TO 20 POUNDS FOR 4 WEEKS________________________    _____________________________________  WHEN TO CALL YOUR DOCTOR: 1. Fever over 101.0 2. Inability to urinate 3. Nausea and/or vomiting 4. Extreme swelling or bruising 5. Continued bleeding from incision. 6. Increased pain, redness, or drainage from the incision  The clinic staff is available to answer your questions during regular business hours.  Please don't hesitate to call and ask to speak to one of the nurses for clinical concerns.  If you have a medical emergency, go to   the nearest emergency room or call 911.  A surgeon from [facility] is always on call at the hospital   [address], [address], [place], [place]  [address] ?  [address] [address], [place], [place]   [address] [phone] ? [phone] ? FAX [phone] Web site:  [email or web address] 

## 2019-04-27 ENCOUNTER — Encounter (HOSPITAL_COMMUNITY): Payer: Self-pay | Admitting: Surgery

## 2019-04-27 DIAGNOSIS — G4733 Obstructive sleep apnea (adult) (pediatric): Secondary | ICD-10-CM | POA: Diagnosis not present

## 2019-05-02 DIAGNOSIS — R69 Illness, unspecified: Secondary | ICD-10-CM | POA: Diagnosis not present

## 2019-05-03 DIAGNOSIS — M5416 Radiculopathy, lumbar region: Secondary | ICD-10-CM | POA: Diagnosis not present

## 2019-05-04 ENCOUNTER — Other Ambulatory Visit: Payer: Self-pay | Admitting: Family Medicine

## 2019-05-04 DIAGNOSIS — G8929 Other chronic pain: Secondary | ICD-10-CM

## 2019-05-04 NOTE — Telephone Encounter (Signed)
Is this okay to refill? 

## 2019-05-05 ENCOUNTER — Telehealth: Payer: Self-pay | Admitting: Family Medicine

## 2019-05-05 DIAGNOSIS — G8929 Other chronic pain: Secondary | ICD-10-CM

## 2019-05-05 NOTE — Telephone Encounter (Signed)
[facility]ax from [facility] re metaxalone 800 mg rx is   Non formulary, try Tizanidine or cyclobenzaprine

## 2019-05-08 DIAGNOSIS — M5416 Radiculopathy, lumbar region: Secondary | ICD-10-CM | POA: Diagnosis not present

## 2019-05-08 MED ORDER — METAXALONE 800 MG PO TABS: ORAL_TABLET | ORAL | 0 refills | Status: DC

## 2019-05-16 DIAGNOSIS — M5417 Radiculopathy, lumbosacral region: Secondary | ICD-10-CM | POA: Diagnosis not present

## 2019-05-16 DIAGNOSIS — M5416 Radiculopathy, lumbar region: Secondary | ICD-10-CM | POA: Diagnosis not present

## 2019-05-16 DIAGNOSIS — M545 Low back pain: Secondary | ICD-10-CM | POA: Diagnosis not present

## 2019-05-23 DIAGNOSIS — L820 Inflamed seborrheic keratosis: Secondary | ICD-10-CM | POA: Diagnosis not present

## 2019-05-23 DIAGNOSIS — D485 Neoplasm of uncertain behavior of skin: Secondary | ICD-10-CM | POA: Diagnosis not present

## 2019-05-23 DIAGNOSIS — L570 Actinic keratosis: Secondary | ICD-10-CM | POA: Diagnosis not present

## 2019-05-23 DIAGNOSIS — L821 Other seborrheic keratosis: Secondary | ICD-10-CM | POA: Diagnosis not present

## 2019-05-23 DIAGNOSIS — C44722 Squamous cell carcinoma of skin of right lower limb, including hip: Secondary | ICD-10-CM | POA: Diagnosis not present

## 2019-05-23 DIAGNOSIS — Z85828 Personal history of other malignant neoplasm of skin: Secondary | ICD-10-CM | POA: Diagnosis not present

## 2019-05-23 DIAGNOSIS — D1801 Hemangioma of skin and subcutaneous tissue: Secondary | ICD-10-CM | POA: Diagnosis not present

## 2019-05-23 DIAGNOSIS — M5416 Radiculopathy, lumbar region: Secondary | ICD-10-CM | POA: Diagnosis not present

## 2019-05-23 DIAGNOSIS — C44729 Squamous cell carcinoma of skin of left lower limb, including hip: Secondary | ICD-10-CM | POA: Diagnosis not present

## 2019-05-24 HISTORY — PX: SKIN BIOPSY: SHX1

## 2019-05-29 DIAGNOSIS — M545 Low back pain: Secondary | ICD-10-CM | POA: Diagnosis not present

## 2019-06-01 DIAGNOSIS — M48061 Spinal stenosis, lumbar region without neurogenic claudication: Secondary | ICD-10-CM | POA: Diagnosis not present

## 2019-06-01 DIAGNOSIS — M545 Low back pain: Secondary | ICD-10-CM | POA: Diagnosis not present

## 2019-06-01 DIAGNOSIS — M5126 Other intervertebral disc displacement, lumbar region: Secondary | ICD-10-CM | POA: Diagnosis not present

## 2019-06-01 DIAGNOSIS — M5136 Other intervertebral disc degeneration, lumbar region: Secondary | ICD-10-CM | POA: Diagnosis not present

## 2019-06-01 DIAGNOSIS — M21371 Foot drop, right foot: Secondary | ICD-10-CM | POA: Diagnosis not present

## 2019-06-13 ENCOUNTER — Encounter: Payer: Self-pay | Admitting: Family Medicine

## 2019-06-13 ENCOUNTER — Other Ambulatory Visit: Payer: Self-pay | Admitting: Family Medicine

## 2019-06-13 DIAGNOSIS — G8929 Other chronic pain: Secondary | ICD-10-CM

## 2019-06-13 DIAGNOSIS — M549 Dorsalgia, unspecified: Secondary | ICD-10-CM

## 2019-06-13 NOTE — Telephone Encounter (Signed)
[facility] is requesting to fill pt metaxalone . Please advise [facility]

## 2019-06-14 ENCOUNTER — Other Ambulatory Visit: Payer: Self-pay | Admitting: Family Medicine

## 2019-06-14 DIAGNOSIS — M549 Dorsalgia, unspecified: Secondary | ICD-10-CM

## 2019-06-14 DIAGNOSIS — G8929 Other chronic pain: Secondary | ICD-10-CM

## 2019-06-14 MED ORDER — METAXALONE 800 MG PO TABS: ORAL_TABLET | ORAL | 0 refills | Status: DC

## 2019-06-20 DIAGNOSIS — G8929 Other chronic pain: Secondary | ICD-10-CM | POA: Diagnosis not present

## 2019-06-20 DIAGNOSIS — M25561 Pain in right knee: Secondary | ICD-10-CM | POA: Diagnosis not present

## 2019-06-21 DIAGNOSIS — H00025 Hordeolum internum left lower eyelid: Secondary | ICD-10-CM | POA: Diagnosis not present

## 2019-06-27 DIAGNOSIS — M5136 Other intervertebral disc degeneration, lumbar region: Secondary | ICD-10-CM | POA: Diagnosis not present

## 2019-07-12 ENCOUNTER — Encounter: Payer: Self-pay | Admitting: Family Medicine

## 2019-07-14 ENCOUNTER — Other Ambulatory Visit: Payer: Self-pay | Admitting: Family Medicine

## 2019-07-14 DIAGNOSIS — G8929 Other chronic pain: Secondary | ICD-10-CM

## 2019-07-14 DIAGNOSIS — M549 Dorsalgia, unspecified: Secondary | ICD-10-CM

## 2019-07-14 NOTE — Telephone Encounter (Signed)
Is this okay to refill? 

## 2019-08-01 ENCOUNTER — Ambulatory Visit: Payer: Medicare HMO | Admitting: Adult Health

## 2019-08-01 ENCOUNTER — Other Ambulatory Visit: Payer: Self-pay

## 2019-08-01 ENCOUNTER — Encounter: Payer: Self-pay | Admitting: Adult Health

## 2019-08-01 VITALS — BP 129/79 | HR 81 | Temp 96.9°F | Ht 73.0 in | Wt 238.2 lb

## 2019-08-01 DIAGNOSIS — Z9989 Dependence on other enabling machines and devices: Secondary | ICD-10-CM

## 2019-08-01 DIAGNOSIS — G4733 Obstructive sleep apnea (adult) (pediatric): Secondary | ICD-10-CM

## 2019-08-01 NOTE — Patient Instructions (Signed)
Continue using CPAP nightly and greater than 4 hours each night °If your symptoms worsen or you develop new symptoms please let us know.  ° °

## 2019-08-01 NOTE — Progress Notes (Signed)
PATIENT: Andrew Yang DOB: 02-Dec-1950  REASON FOR VISIT: follow up HISTORY FROM: patient  HISTORY OF PRESENT ILLNESS: Today 08/01/19: Andrew Yang is a 68 year old male with a history of obstructive sleep apnea on CPAP.  He returns today for follow-up.  His download indicates that he uses machine 30 out of 30 days for compliance of 100%.  He uses machine greater than 4 hours 29 days for compliance of 97%.  On average he uses his machine 7 hours and 7 minutes.  His residual AHI is 2.1 on 9 cm of water with EPR of 1.  His leak in the 95th percentile is 14.8 L/min.  Reports that the CPAP works well for him.  He denies any new issues.  He returns today for an evaluation.  HISTORY 12/18/2019CM Andrew Yang, 68 year old male returns for follow-up with history of obstructive sleep apnea here for CPAP compliance.  He has been doing well with his CPAP.  Data dated 06/26/2018-07/25/2018 shows compliance greater than 4 hours at 87%.  Usage days 97%.  Average usage 6 hours 31 minutes.  Set pressure of 9 cm leak 95th percentile 10.7.  AHI 1.1.  ESS 5 fatigue severity scale 12.  He returns for reevaluation  REVIEW OF SYSTEMS: Out of a complete 14 system review of symptoms, the patient complains only of the following symptoms, and all other reviewed systems are negative.  See HPI  ESS 5 FSS 6  ALLERGIES: Allergies  Allergen Reactions  . Lisinopril Cough    HOME MEDICATIONS: Outpatient Medications Prior to Visit  Medication Sig Dispense Refill  . acetaminophen (TYLENOL) 500 MG tablet Take 1,000 mg by mouth daily.     . Ascorbic Acid (VITAMIN C) 1000 MG tablet Take 1,000 mg by mouth daily.    . Butalbital-APAP-Caffeine 50-300-40 MG CAPS Take 50 mg by mouth as needed. (Patient taking differently: Take 1 capsule by mouth daily as needed (headache). ) 30 capsule 2  . celecoxib (CELEBREX) 200 MG capsule TAKE 1 CAPSULE (200 MG TOTAL) BY MOUTH 2 (TWO) TIMES DAILY. (Patient taking differently: Take 200 mg  by mouth daily. ) 30 capsule 5  . cholecalciferol (VITAMIN D3) 25 MCG (1000 UT) tablet Take 1,000 Units by mouth daily.    . Ginkgo Biloba Extract (GNP GINGKO BILOBA EXTRACT) 60 MG CAPS Take 120 mg by mouth daily.    Marland Kitchen GINSENG PO Take 2 tablets by mouth daily.    . Glucosamine-Chondroitin (GLUCOSAMINE CHONDR COMPLEX PO) Take 2 tablets by mouth daily.     Marland Kitchen ibuprofen (ADVIL) 200 MG tablet Take 600 mg by mouth daily.     Marland Kitchen losartan-hydrochlorothiazide (HYZAAR) 100-12.5 MG tablet Take 1 tablet by mouth daily. 90 tablet 3  . metaxalone (SKELAXIN) 800 MG tablet TAKE 1 TABLET (800 MG TOTAL) THREE TIMES A DAY BY MOUTH AS NEEDED 90 tablet 0  . Multiple Vitamin (MULTIVITAMIN WITH MINERALS) TABS tablet Take 1 tablet by mouth daily.    . rosuvastatin (CRESTOR) 40 MG tablet Take 1 tablet (40 mg total) by mouth daily. 90 tablet 3  . tadalafil (CIALIS) 20 MG tablet Take 1 tablet (20 mg total) by mouth daily as needed for erectile dysfunction. 10 tablet 11  . venlafaxine (EFFEXOR) 75 MG tablet Take 1 tablet (75 mg total) by mouth 2 (two) times daily. 180 tablet 1  . oxyCODONE (OXY IR/ROXICODONE) 5 MG immediate release tablet Take 1 tablet (5 mg total) by mouth every 6 (six) hours as needed for moderate pain or  severe pain. 20 tablet 0  . Polyethyl Glycol-Propyl Glycol (SYSTANE OP) Place 1 drop into both eyes daily as needed (dry eyes).     No facility-administered medications prior to visit.    PAST MEDICAL HISTORY: Past Medical History:  Diagnosis Date  . Allergy    RHINITIS  . Colonic polyp   . Diverticulosis   . Dyslipidemia   . Hemorrhoids   . Hepatitis C    treated with interferon and Ribiviran  with negative viral loads  . Hypertension   . Pneumonia    as a child  . Sleep apnea   . Sleep apnea    CPAP  . Smoker     PAST SURGICAL HISTORY: Past Surgical History:  Procedure Laterality Date  . ANTERIOR CRUCIATE LIGAMENT REPAIR  2006  . EYE SURGERY     cataract 2018  bil ---1/ 2019   lasik right eye 2019  -- 2020 laser left eye removed a film growing over cataract  . SKIN BIOPSY Left    Occipital scalp shave &ED&C  basal cell carinoma, nodular pattern  . SKIN BIOPSY Right 05/24/2019   squamous cell carcinoma, keratoacanthoma type,features of regression  . UMBILICAL HERNIA REPAIR N/A 04/26/2019   Procedure: UMBILICAL HERNIA REPAIR WITH MESH;  Surgeon: Coralie Keens, MD;  Location: [facility];  Service: General;  Laterality: N/A;    FAMILY HISTORY: Family History  Problem Relation Age of Onset  . Arthritis Mother   . Hypertension Father   . Heart disease Maternal Grandfather     SOCIAL HISTORY: Social History   Socioeconomic History  . Marital status: Married    Spouse name: Not on file  . Number of children: Not on file  . Years of education: Not on file  . Highest education level: Not on file  Occupational History  . Not on file  Tobacco Use  . Smoking status: Former Smoker    Quit date: 10/26/2016    Years since quitting: 2.7  . Smokeless tobacco: Never Used  Substance and Sexual Activity  . Alcohol use: Yes    Alcohol/week: 15.0 standard drinks    Types: 15 Cans of beer per week  . Drug use: No  . Sexual activity: Yes  Other Topics Concern  . Not on file  Social History Narrative  . Not on file   Social Determinants of Health   Financial Resource Strain:   . Difficulty of Paying Living Expenses: Not on file  Food Insecurity:   . Worried About Charity fundraiser in the Last Year: Not on file  . Ran Out of Food in the Last Year: Not on file  Transportation Needs:   . Lack of Transportation (Medical): Not on file  . Lack of Transportation (Non-Medical): Not on file  Physical Activity:   . Days of Exercise per Week: Not on file  . Minutes of Exercise per Session: Not on file  Stress:   . Feeling of Stress : Not on file  Social Connections:   . Frequency of Communication with Friends and Family: Not on file  . Frequency of Social  Gatherings with Friends and Family: Not on file  . Attends Religious Services: Not on file  . Active Member of Clubs or Organizations: Not on file  . Attends Archivist Meetings: Not on file  . Marital Status: Not on file  Intimate Partner Violence:   . Fear of Current or Ex-Partner: Not on file  . Emotionally Abused: Not on  file  . Physically Abused: Not on file  . Sexually Abused: Not on file      PHYSICAL EXAM  Vitals:   08/01/19 1016  BP: 129/79  Pulse: 81  Temp: (!) 96.9 F (36.1 C)  TempSrc: Oral  Weight: 238 lb 3.2 oz (108 kg)  Height: 6\' 1"  (1.854 m)   Body mass index is 31.43 kg/m.  Generalized: Well developed, in no acute distress  Chest: Lungs clear to auscultation bilaterally  Neurological examination  Mentation: Alert oriented to time, place, history taking. Follows all commands speech and language fluent Cranial nerve II-XII: Extraocular movements were full, visual field were full on confrontational test Head turning and shoulder shrug  were normal and symmetric. Motor: The motor testing reveals 5 over 5 strength of all 4 extremities. Good symmetric motor tone is noted throughout.  Sensory: Sensory testing is intact to soft touch on all 4 extremities. No evidence of extinction is noted.  Gait and station: Gait is normal.    DIAGNOSTIC DATA (LABS, IMAGING, TESTING) - I reviewed patient records, labs, notes, testing and imaging myself where available.  Lab Results  Component Value Date   WBC 6.9 04/19/2019   HGB 14.5 04/19/2019   HCT 43.9 04/19/2019   MCV 94.2 04/19/2019   PLT 183 04/19/2019      Component Value Date/Time   NA 139 04/19/2019 1104   NA 142 11/29/2018 0810   K 4.6 04/19/2019 1104   CL 104 04/19/2019 1104   CO2 29 04/19/2019 1104   GLUCOSE 103 (H) 04/19/2019 1104   BUN 14 04/19/2019 1104   BUN 13 11/29/2018 0810   CREATININE 1.18 04/19/2019 1104   CREATININE 1.10 10/08/2016 1459   CALCIUM 9.4 04/19/2019 1104   PROT  7.5 04/19/2019 1104   PROT 6.5 11/29/2018 0810   ALBUMIN 4.5 04/19/2019 1104   ALBUMIN 4.1 11/29/2018 0810   AST 25 04/19/2019 1104   ALT 31 04/19/2019 1104   ALKPHOS 73 04/19/2019 1104   BILITOT 0.7 04/19/2019 1104   BILITOT 0.2 11/29/2018 0810   GFRNONAA >60 04/19/2019 1104   GFRAA >60 04/19/2019 1104   Lab Results  Component Value Date   CHOL 187 11/29/2018   HDL 38 (L) 11/29/2018   LDLCALC 96 11/29/2018   TRIG 264 (H) 11/29/2018   CHOLHDL 4.9 11/29/2018   Lab Results  Component Value Date   HGBA1C 4.4 12/13/2018      ASSESSMENT AND PLAN 68 y.o. year old male  has a past medical history of Allergy, Colonic polyp, Diverticulosis, Dyslipidemia, Hemorrhoids, Hepatitis C, Hypertension, Pneumonia, Sleep apnea, Sleep apnea, and Smoker. here with :  1. Obstructive sleep apnea on CPAP  The patient's CPAP download shows excellent compliance and good treatment of his apnea.  He is encouraged to continue using CPAP nightly and greater than 4 hours each night.  He is advised that if his symptoms worsen or he develops new symptoms he should let us know.  He will follow-up in 1 year or sooner if needed    I spent 15 minutes with the patient. 50% of this time was spent reviewing CPAP download   Ward Givens, MSN, NP-C 08/01/2019, 10:32 AM [facility] [address] [place], [place] [address] [phone]

## 2019-08-07 DIAGNOSIS — G4733 Obstructive sleep apnea (adult) (pediatric): Secondary | ICD-10-CM | POA: Diagnosis not present

## 2019-08-10 ENCOUNTER — Other Ambulatory Visit: Payer: Self-pay

## 2019-08-10 ENCOUNTER — Ambulatory Visit (INDEPENDENT_AMBULATORY_CARE_PROVIDER_SITE_OTHER): Payer: Medicare HMO | Admitting: Family Medicine

## 2019-08-10 ENCOUNTER — Encounter: Payer: Self-pay | Admitting: Family Medicine

## 2019-08-10 VITALS — BP 132/100 | HR 60 | Temp 97.9°F | Ht 73.0 in | Wt 235.0 lb

## 2019-08-10 DIAGNOSIS — L239 Allergic contact dermatitis, unspecified cause: Secondary | ICD-10-CM

## 2019-08-10 DIAGNOSIS — L738 Other specified follicular disorders: Secondary | ICD-10-CM | POA: Diagnosis not present

## 2019-08-10 DIAGNOSIS — M549 Dorsalgia, unspecified: Secondary | ICD-10-CM

## 2019-08-10 DIAGNOSIS — L299 Pruritus, unspecified: Secondary | ICD-10-CM | POA: Diagnosis not present

## 2019-08-10 DIAGNOSIS — I10 Essential (primary) hypertension: Secondary | ICD-10-CM

## 2019-08-10 DIAGNOSIS — L0232 Furuncle of buttock: Secondary | ICD-10-CM | POA: Diagnosis not present

## 2019-08-10 DIAGNOSIS — G8929 Other chronic pain: Secondary | ICD-10-CM | POA: Diagnosis not present

## 2019-08-10 MED ORDER — MUPIROCIN 2 % EX OINT: 1.0000 "application " | TOPICAL_OINTMENT | Freq: Two times a day (BID) | CUTANEOUS | 0 refills | Status: DC

## 2019-08-10 MED ORDER — CIPROFLOXACIN HCL 500 MG PO TABS: 500.0000 mg | ORAL_TABLET | Freq: Two times a day (BID) | ORAL | 0 refills | Status: DC

## 2019-08-10 MED ORDER — METHYLPREDNISOLONE 4 MG PO TBPK: ORAL_TABLET | ORAL | 0 refills | Status: DC

## 2019-08-10 NOTE — Patient Instructions (Addendum)
Start taking cipro twice daily for 10 days to treat a potential folliculitis. Definitely get your water checked. We are also going to treat you with steroids for a possible allergic reaction and should help with your itching. You should also take zyrtec once daily to help with the itching. Stop taking the Actifed (use zyrtec instead). Your blood pressure was elevated, actifed might contribute.  Do warm soaks to the boil on your bottom (three times daily) and use the bactroban ointment twice daily until resolved.  Stop taking ibuprofen--you shouldn't take this if taking celebrex (one or the other). You may continue tylenol

## 2019-08-10 NOTE — Progress Notes (Signed)
Chief Complaint  Patient presents with  . Rash    started last night and is very itchy. Got a new hot tub 12 days ago. And was in the hot tub 3 nights in a row x 1.5 hrs. Then waited about 8 days and went back in last night and rash in worse.   . Recurrent Skin Infections    also has a boil on his right buttock, under neath. Very painful. Felt it while he was riding his exercise bike yesterday.   Got a new hot tub before Christmas.  Been in it 2-3 times.  Stayed in for 1.5 hours, "as hot as it goes".  Noticed the rash after those 3 days.  He looked it up, saw folliculitis or heat rash as possible causes for his rash.  He reports that the rash improved right away, so didn't think it is folliculitis (has needed ABX in the past).  He suspected heat rash.  Waited 10 days before getting back into the hot tub. Last night only went in 25-30 minutes Had itching only at skin folds. An hour after going to bed his "whole body was on fire"  Used hemp freeze with menthol, which helped.  He presents today with rash to his whole body, save for his neck/face that wasn't in the water.  He is planning to go to hot tub place to have water tested after leaving office today.  He also noticed what he thinks may be a boil on his R buttock.  He has had boils in the past, feels similar. He noticed irritation after riding on his exercise bike.   PMH, PSH, SH reviewed  Outpatient Encounter Medications as of 08/10/2019  Medication Sig  . acetaminophen (TYLENOL) 500 MG tablet Take 1,000 mg by mouth daily.   . Ascorbic Acid (VITAMIN C) 1000 MG tablet Take 1,000 mg by mouth daily.  . celecoxib (CELEBREX) 200 MG capsule TAKE 1 CAPSULE (200 MG TOTAL) BY MOUTH 2 (TWO) TIMES DAILY. (Patient taking differently: Take 200 mg by mouth daily. )  . cholecalciferol (VITAMIN D3) 25 MCG (1000 UT) tablet Take 1,000 Units by mouth daily.  . Ginkgo Biloba Extract (GNP GINGKO BILOBA EXTRACT) 60 MG CAPS Take 120 mg by mouth daily.  Marland Kitchen  GINSENG PO Take 2 tablets by mouth daily.  . Glucosamine-Chondroitin (GLUCOSAMINE CHONDR COMPLEX PO) Take 2 tablets by mouth daily.   Marland Kitchen ibuprofen (ADVIL) 200 MG tablet Take 600 mg by mouth daily.   Marland Kitchen losartan-hydrochlorothiazide (HYZAAR) 100-12.5 MG tablet Take 1 tablet by mouth daily.  . metaxalone (SKELAXIN) 800 MG tablet TAKE 1 TABLET (800 MG TOTAL) THREE TIMES A DAY BY MOUTH AS NEEDED  . Multiple Vitamin (MULTIVITAMIN WITH MINERALS) TABS tablet Take 1 tablet by mouth daily.  . rosuvastatin (CRESTOR) 40 MG tablet Take 1 tablet (40 mg total) by mouth daily.  Marland Kitchen venlafaxine (EFFEXOR) 75 MG tablet Take 1 tablet (75 mg total) by mouth 2 (two) times daily.  . Butalbital-APAP-Caffeine 50-300-40 MG CAPS Take 50 mg by mouth as needed. (Patient not taking: Reported on 08/10/2019)  . tadalafil (CIALIS) 20 MG tablet Take 1 tablet (20 mg total) by mouth daily as needed for erectile dysfunction. (Patient not taking: Reported on 08/10/2019)   No facility-administered encounter medications on file as of 08/10/2019.   He reports taking Actifed daily as well.  Allergies  Allergen Reactions  . Lisinopril Cough   ROS:  No fever, chills, URI, cough, shortness of breath. No GI complaints.  Skin concerns per HPI.  +Sciatica.  Using skelaxin, tylenol, ibuprofen and celebrex to help with pain.   PHYSICAL EXAM:  BP (!) 132/100   Pulse 60   Temp 97.9 F (36.6 C) (Other (Comment))   Ht 6\' 1"  (1.854 m)   Wt 235 lb (106.6 kg)   BMI 31.00 kg/m   Pleasant, well-appearing male, in no distress Skin: Diffuse scattered papules, some appear slightly pustular, widespread across his arms, trunk, legs.  R buttock near perineum there is a superficial boil--no induration, no erythema. No active drainage, feels soft.   ASSESSMENT/PLAN:  Boil of buttock - warm compresses 2-3x daily, and use of bactroban ointment.  f/u if worsening, for I&D - Plan: mupirocin ointment (BACTROBAN) 2 %  Hot tub folliculitis -  widespread.  Treat with cipro, risks reviewed.  To have water tested (usually pseudomonas, due to inadequate chlorination) - Plan: ciprofloxacin (CIPRO) 500 MG tablet  Itching - differential diagnosis of his widespread rash and itching could be contact derm. Will cover with steroids given his degree of itching.  Risks/SE reviewed - Plan: methylPREDNISolone (MEDROL DOSEPAK) 4 MG TBPK tablet  Allergic contact dermatitis, unspecified trigger - possible, as stated above - Plan: methylPREDNISolone (MEDROL DOSEPAK) 4 MG TBPK tablet  Essential hypertension - BP elevated today.  Actifed can contribute; advised to stop actifed, use zyrtec instead (for allergies and itching)  Chronic back pain, unspecified back location, unspecified back pain laterality - reviewed his med regimen--advised to STOP ibuprofen, since he takes celebrex daily. Can continue tylenol, skelaxin    Stop taking actifed Instead take zyrtec once daily.  Stop taking ibuprofen--you shouldn't take this if taking celebrex (one or the other). You may continue tylenol

## 2019-08-25 ENCOUNTER — Ambulatory Visit (INDEPENDENT_AMBULATORY_CARE_PROVIDER_SITE_OTHER): Payer: Medicare HMO | Admitting: Family Medicine

## 2019-08-25 ENCOUNTER — Other Ambulatory Visit: Payer: Self-pay

## 2019-08-25 ENCOUNTER — Telehealth: Payer: Self-pay | Admitting: Family Medicine

## 2019-08-25 ENCOUNTER — Encounter: Payer: Self-pay | Admitting: Family Medicine

## 2019-08-25 VITALS — Wt 238.0 lb

## 2019-08-25 DIAGNOSIS — L239 Allergic contact dermatitis, unspecified cause: Secondary | ICD-10-CM

## 2019-08-25 NOTE — Progress Notes (Signed)
   Subjective:  Documentation for virtual audio and video telecommunications through [place] encounter:  The patient was located at home. 2 patient identifiers used.  The provider was located in the office. The patient did consent to this visit and is aware of possible charges through their insurance for this visit.  The other persons participating in this telemedicine service were none.    Patient ID: Andrew Yang, male    DOB: May 09, 1951, 69 y.o.   MRN: [id number]  HPI Chief Complaint  Patient presents with  . rash    rash all over, got in hot tub last night   Complains of a pruritic diffuse rash that started in the middle of the night after being in his hot tub.   States he had a similar rash a couple of weeks ago and saw Dr. Tomi Bamberger for it. They thought he has hot tub folliculitis and he was treated with Cipro and a Medrol dose pak. He questions whether he may need another steroid course today.  States he has taken Benadryl and used cortisone cream which has helped a lot.   States his hot tub is new and he had people check it out and they do not think this is due to pseudomonas. He believes his skin is sensitive to the chemicals.   Denies fever, chills,  shortness of breath, abdominal pain, N/V/D.   Reviewed allergies, medications, past medical, surgical, family, and social history.    Review of Systems Pertinent positives and negatives in the history of present illness.     Objective:   Physical Exam Wt 238 lb (108 kg)   BMI 31.40 kg/m   Alert and oriented and in no acute distress.  Speaking in complete sentences without difficulty.  No coughing.  Normal speech, mood and thought process.       Assessment & Plan:  Allergic contact dermatitis, unspecified trigger  Discussed limitations of a virtual visit. He is well-appearing and denies any systemic symptoms. Encouraged treatment with Pepcid twice daily and offered hydroxyzine.  He will hold off on the  hydroxyzine and continue with Benadryl as needed.  States topical steroids are taking care of his itching as well. Encouraged Dial or Lever 2000 soap in case of folliculitis. He will call back if his symptoms worsen.  Time spent on call was 11 minutes and in review of previous records 15 minutes total.  This virtual service is not related to other E/M service within previous 7 days.

## 2019-08-25 NOTE — Telephone Encounter (Signed)
Please call [t  Pt called he was in a few weeks ago to see Dr. Tomi Bamberger for a reaction to chemicals in his hot tub and was given rx prednosine  He got in it again last night and is having another reaction, pt wants to know he can have refill called in for the prednisone

## 2019-08-25 NOTE — Telephone Encounter (Signed)
I recommend that he have a visit to discuss either with me or Shane.

## 2019-08-25 NOTE — Telephone Encounter (Signed)
Pt doing virtual today

## 2019-09-04 DIAGNOSIS — M5126 Other intervertebral disc displacement, lumbar region: Secondary | ICD-10-CM | POA: Diagnosis not present

## 2019-09-04 DIAGNOSIS — M5136 Other intervertebral disc degeneration, lumbar region: Secondary | ICD-10-CM | POA: Diagnosis not present

## 2019-09-04 DIAGNOSIS — M21371 Foot drop, right foot: Secondary | ICD-10-CM | POA: Diagnosis not present

## 2019-09-04 DIAGNOSIS — G4733 Obstructive sleep apnea (adult) (pediatric): Secondary | ICD-10-CM | POA: Diagnosis not present

## 2019-09-04 DIAGNOSIS — E669 Obesity, unspecified: Secondary | ICD-10-CM | POA: Diagnosis not present

## 2019-09-04 DIAGNOSIS — M48061 Spinal stenosis, lumbar region without neurogenic claudication: Secondary | ICD-10-CM | POA: Diagnosis not present

## 2019-09-04 DIAGNOSIS — M545 Low back pain: Secondary | ICD-10-CM | POA: Diagnosis not present

## 2019-09-08 ENCOUNTER — Ambulatory Visit: Payer: Medicare HMO

## 2019-09-16 ENCOUNTER — Ambulatory Visit: Payer: Medicare HMO | Attending: Internal Medicine

## 2019-09-16 DIAGNOSIS — Z23 Encounter for immunization: Secondary | ICD-10-CM | POA: Insufficient documentation

## 2019-09-16 NOTE — Progress Notes (Signed)
   Covid-19 Vaccination Clinic  Name:  Andrew Yang    MRN: [id number] DOB: 29-Jun-1951  09/16/2019  [name] was observed post Covid-19 immunization for 15 minutes without incidence. He was provided with Vaccine Information Sheet and instruction to access the V-Safe system.   [name] was instructed to call 911 with any severe reactions post vaccine: Marland Kitchen Difficulty breathing  . Swelling of your face and throat  . A fast heartbeat  . A bad rash all over your body  . Dizziness and weakness    Immunizations Administered    Name Date Dose VIS Date Route   Pfizer COVID-19 Vaccine 09/16/2019  2:07 PM 0.3 mL 07/21/2019 Intramuscular   Manufacturer: [place]   Lot: [id number]   [place]: [id number]

## 2019-09-19 ENCOUNTER — Ambulatory Visit: Payer: Medicare HMO

## 2019-09-21 DIAGNOSIS — M5136 Other intervertebral disc degeneration, lumbar region: Secondary | ICD-10-CM | POA: Diagnosis not present

## 2019-10-01 ENCOUNTER — Other Ambulatory Visit: Payer: Self-pay | Admitting: Family Medicine

## 2019-10-01 DIAGNOSIS — G8929 Other chronic pain: Secondary | ICD-10-CM

## 2019-10-02 NOTE — Telephone Encounter (Signed)
[facility] is requesting to fill pt butalbital. Please advise [facility]

## 2019-10-04 ENCOUNTER — Telehealth: Payer: Self-pay

## 2019-10-04 NOTE — Telephone Encounter (Signed)
I submitted a PA for the pts. Metaxalone and it was approved from 08/11/19-08/09/20.

## 2019-10-11 ENCOUNTER — Ambulatory Visit: Payer: Medicare HMO | Attending: Internal Medicine

## 2019-10-11 DIAGNOSIS — Z23 Encounter for immunization: Secondary | ICD-10-CM | POA: Insufficient documentation

## 2019-10-11 NOTE — Progress Notes (Signed)
   Covid-19 Vaccination Clinic  Name:  Andrew Yang    MRN: [id number] DOB: 1951/04/01  10/11/2019  Andrew Yang was observed post Covid-19 immunization for 15 minutes without incident. He was provided with Vaccine Information Sheet and instruction to access the V-Safe system.   Andrew Yang was instructed to call 911 with any severe reactions post vaccine: Marland Kitchen Difficulty breathing  . Swelling of face and throat  . A fast heartbeat  . A bad rash all over body  . Dizziness and weakness   Immunizations Administered    Name Date Dose VIS Date Route   Pfizer COVID-19 Vaccine 10/11/2019 11:12 AM 0.3 mL 07/21/2019 Intramuscular   Manufacturer: [place]   Lot: [id number]   [place]: [id number]

## 2019-10-24 ENCOUNTER — Other Ambulatory Visit: Payer: Self-pay

## 2019-10-24 ENCOUNTER — Encounter: Payer: Self-pay | Admitting: Family Medicine

## 2019-10-24 ENCOUNTER — Ambulatory Visit
Admission: RE | Admit: 2019-10-24 | Discharge: 2019-10-24 | Disposition: A | Discharge status: Home / Self Care | Payer: Medicare HMO | Source: Ambulatory Visit | Attending: Family Medicine | Admitting: Family Medicine

## 2019-10-24 ENCOUNTER — Ambulatory Visit (INDEPENDENT_AMBULATORY_CARE_PROVIDER_SITE_OTHER): Payer: Medicare HMO | Admitting: Family Medicine

## 2019-10-24 VITALS — BP 144/90 | HR 73 | Temp 96.6°F | Wt 240.2 lb

## 2019-10-24 DIAGNOSIS — M549 Dorsalgia, unspecified: Secondary | ICD-10-CM | POA: Diagnosis not present

## 2019-10-24 DIAGNOSIS — L989 Disorder of the skin and subcutaneous tissue, unspecified: Secondary | ICD-10-CM

## 2019-10-24 DIAGNOSIS — S76111A Strain of right quadriceps muscle, fascia and tendon, initial encounter: Secondary | ICD-10-CM

## 2019-10-24 DIAGNOSIS — M1711 Unilateral primary osteoarthritis, right knee: Secondary | ICD-10-CM | POA: Diagnosis not present

## 2019-10-24 DIAGNOSIS — M25561 Pain in right knee: Secondary | ICD-10-CM

## 2019-10-24 DIAGNOSIS — G8929 Other chronic pain: Secondary | ICD-10-CM | POA: Diagnosis not present

## 2019-10-24 MED ORDER — LIDOCAINE HCL 1 % IJ SOLN
10.0000 mL | Freq: Once | INTRAMUSCULAR | Status: AC
  Administered 2019-10-24: 17:00:00 10 mL via INTRADERMAL

## 2019-10-24 MED ORDER — TRIAMCINOLONE ACETONIDE 40 MG/ML IJ SUSP
40.0000 mg | Freq: Once | INTRAMUSCULAR | Status: AC
  Administered 2019-10-24: 17:00:00 40 mg via INTRAMUSCULAR

## 2019-10-24 NOTE — Addendum Note (Signed)
Addended by: Elyse Jarvis on: 10/24/2019 04:41 PM   Modules accepted: Orders

## 2019-10-24 NOTE — Progress Notes (Signed)
   Subjective:    Patient ID: Andrew Yang, male    DOB: May 11, 1951, 69 y.o.   MRN: [id number]  HPI He is here for discussion of several issues.  He has a feeling of a blister to the plantar surface of his right great toe. He also has a history of chronic back pain and has had an epidural injection which he states really helped. He also indicates that he partially tore his right quadriceps in a fall that caused him to Hyperflex his right knee and he noted swelling proximally in his quadriceps.  This was over a year ago. Since that time he is also noted more right knee pain especially medially.  No popping locking or grinding.  Review of Systems     Objective:   Physical Exam Exam of the right proximal quad does show a partial tear near the inguinal ligament that does contract with flexion of the quad. Right knee exam does show some lipping medially.  Ligaments intact.  Negative anterior drawer. Right foot exam does show a questionable lesion to the lateral aspect of the right great toe plantar surface.  Is nontender.       Assessment & Plan:  Skin lesion  Chronic back pain, unspecified back location, unspecified back pain laterality  Right knee pain, unspecified chronicity - Plan: DG Knee Complete 4 Views Right  Rupture of right quadriceps muscle, initial encounter I explained that I could indeed feel something in his great toe but since is not causing a great deal difficulty, he would prefer to leave it alone. Encouraged him to get another epidural for his back since it helped him. No therapy needed for the quad tendon rupture. Discussed therapy for the right knee pain.  Explained that this is most likely arthritis and we need an x-ray.  He would like to go ahead and get the shot today and x-ray after that for convenience.  The knee was prepped laterally with Betadine.  The lateral joint was identified.  40 mg of Kenalog and 3 cc of Xylocaine was injected into the joint without  difficulty.

## 2019-11-01 DIAGNOSIS — R69 Illness, unspecified: Secondary | ICD-10-CM | POA: Diagnosis not present

## 2019-11-07 ENCOUNTER — Encounter: Payer: Self-pay | Admitting: Family Medicine

## 2019-11-07 DIAGNOSIS — L814 Other melanin hyperpigmentation: Secondary | ICD-10-CM | POA: Diagnosis not present

## 2019-11-07 DIAGNOSIS — D2371 Other benign neoplasm of skin of right lower limb, including hip: Secondary | ICD-10-CM | POA: Diagnosis not present

## 2019-11-07 DIAGNOSIS — L570 Actinic keratosis: Secondary | ICD-10-CM | POA: Diagnosis not present

## 2019-11-07 DIAGNOSIS — Z85828 Personal history of other malignant neoplasm of skin: Secondary | ICD-10-CM | POA: Diagnosis not present

## 2019-11-07 DIAGNOSIS — D1801 Hemangioma of skin and subcutaneous tissue: Secondary | ICD-10-CM | POA: Diagnosis not present

## 2019-11-07 DIAGNOSIS — L820 Inflamed seborrheic keratosis: Secondary | ICD-10-CM | POA: Diagnosis not present

## 2019-11-07 DIAGNOSIS — L821 Other seborrheic keratosis: Secondary | ICD-10-CM | POA: Diagnosis not present

## 2019-11-07 DIAGNOSIS — D225 Melanocytic nevi of trunk: Secondary | ICD-10-CM | POA: Diagnosis not present

## 2019-11-22 ENCOUNTER — Telehealth: Payer: Self-pay | Admitting: Family Medicine

## 2019-11-22 DIAGNOSIS — I700 Atherosclerosis of aorta: Secondary | ICD-10-CM

## 2019-11-22 DIAGNOSIS — I10 Essential (primary) hypertension: Secondary | ICD-10-CM

## 2019-11-22 DIAGNOSIS — R768 Other specified abnormal immunological findings in serum: Secondary | ICD-10-CM

## 2019-11-22 DIAGNOSIS — Z125 Encounter for screening for malignant neoplasm of prostate: Secondary | ICD-10-CM

## 2019-11-22 DIAGNOSIS — R7302 Impaired glucose tolerance (oral): Secondary | ICD-10-CM

## 2019-11-22 NOTE — Telephone Encounter (Signed)
Pt wants to come in may 28th 8.15

## 2019-11-22 NOTE — Telephone Encounter (Signed)
Done

## 2019-11-22 NOTE — Telephone Encounter (Signed)
Called and left message for pt put pt on schedule

## 2019-11-22 NOTE — Telephone Encounter (Signed)
Pt called and is having a cpe on June the 10 and pt wants to come in June the 8th have blood work and also wants hep c and psa ordered   If that is ok

## 2019-11-28 ENCOUNTER — Other Ambulatory Visit: Payer: Self-pay | Admitting: Family Medicine

## 2019-11-28 NOTE — Telephone Encounter (Signed)
[facility] celebrex. Please advise [facility] 

## 2019-12-14 ENCOUNTER — Encounter: Payer: Self-pay | Admitting: Internal Medicine

## 2019-12-17 DIAGNOSIS — S0101XA Laceration without foreign body of scalp, initial encounter: Secondary | ICD-10-CM | POA: Diagnosis not present

## 2019-12-17 DIAGNOSIS — W228XXA Striking against or struck by other objects, initial encounter: Secondary | ICD-10-CM | POA: Diagnosis not present

## 2019-12-17 DIAGNOSIS — Z23 Encounter for immunization: Secondary | ICD-10-CM | POA: Diagnosis not present

## 2019-12-21 ENCOUNTER — Telehealth: Payer: Self-pay

## 2019-12-21 NOTE — Telephone Encounter (Signed)
Pt was called and advise if he would like to have his staples removed here he can. No answer LVM. [place]

## 2020-01-01 ENCOUNTER — Other Ambulatory Visit: Payer: Self-pay

## 2020-01-01 ENCOUNTER — Ambulatory Visit (INDEPENDENT_AMBULATORY_CARE_PROVIDER_SITE_OTHER): Payer: Medicare HMO | Admitting: Family Medicine

## 2020-01-01 VITALS — BP 138/88 | HR 88 | Temp 98.4°F | Wt 237.4 lb

## 2020-01-01 DIAGNOSIS — Z4802 Encounter for removal of sutures: Secondary | ICD-10-CM | POA: Diagnosis not present

## 2020-01-01 NOTE — Progress Notes (Signed)
   Subjective:    Patient ID: Andrew Yang, male    DOB: [birth year], 69 y.o.   MRN: [id number]  HPI He is here for staple removal.  Staples were placed in the mid scalp area optimally 4 days ago at another institution and he is here for removal.  He is having no difficulty with that.   Review of Systems     Objective:   Physical Exam Alert and in no distress.  Staples were visualized in the tissue seem to be healing nicely with no evidence of infection       Assessment & Plan:  Encounter for staple removal Staples were removed without difficulty.

## 2020-01-04 ENCOUNTER — Other Ambulatory Visit: Payer: Medicare HMO

## 2020-01-05 ENCOUNTER — Other Ambulatory Visit: Payer: Self-pay | Admitting: Family Medicine

## 2020-01-05 ENCOUNTER — Other Ambulatory Visit: Payer: Medicare HMO

## 2020-01-05 DIAGNOSIS — F341 Dysthymic disorder: Secondary | ICD-10-CM

## 2020-01-05 NOTE — Telephone Encounter (Signed)
[facility]fe[facility]. Please advise [facility] 

## 2020-01-13 ENCOUNTER — Other Ambulatory Visit: Payer: Self-pay | Admitting: Family Medicine

## 2020-01-13 DIAGNOSIS — E785 Hyperlipidemia, unspecified: Secondary | ICD-10-CM

## 2020-01-13 DIAGNOSIS — I10 Essential (primary) hypertension: Secondary | ICD-10-CM

## 2020-01-13 DIAGNOSIS — I7090 Unspecified atherosclerosis: Secondary | ICD-10-CM

## 2020-01-17 DIAGNOSIS — G4733 Obstructive sleep apnea (adult) (pediatric): Secondary | ICD-10-CM | POA: Diagnosis not present

## 2020-01-18 ENCOUNTER — Ambulatory Visit: Payer: Medicare HMO | Admitting: Family Medicine

## 2020-02-16 DIAGNOSIS — G4733 Obstructive sleep apnea (adult) (pediatric): Secondary | ICD-10-CM | POA: Diagnosis not present

## 2020-03-18 DIAGNOSIS — G4733 Obstructive sleep apnea (adult) (pediatric): Secondary | ICD-10-CM | POA: Diagnosis not present

## 2020-04-16 ENCOUNTER — Other Ambulatory Visit: Payer: Self-pay | Admitting: Family Medicine

## 2020-04-16 DIAGNOSIS — I10 Essential (primary) hypertension: Secondary | ICD-10-CM

## 2020-04-30 ENCOUNTER — Other Ambulatory Visit: Payer: Self-pay | Admitting: Family Medicine

## 2020-04-30 DIAGNOSIS — E785 Hyperlipidemia, unspecified: Secondary | ICD-10-CM

## 2020-04-30 DIAGNOSIS — I7090 Unspecified atherosclerosis: Secondary | ICD-10-CM

## 2020-05-02 ENCOUNTER — Encounter: Payer: Self-pay | Admitting: Family Medicine

## 2020-05-02 DIAGNOSIS — Z0000 Encounter for general adult medical examination without abnormal findings: Secondary | ICD-10-CM

## 2020-05-02 DIAGNOSIS — R768 Other specified abnormal immunological findings in serum: Secondary | ICD-10-CM

## 2020-05-02 DIAGNOSIS — I10 Essential (primary) hypertension: Secondary | ICD-10-CM

## 2020-05-02 DIAGNOSIS — R7302 Impaired glucose tolerance (oral): Secondary | ICD-10-CM

## 2020-05-02 DIAGNOSIS — Z125 Encounter for screening for malignant neoplasm of prostate: Secondary | ICD-10-CM

## 2020-05-09 ENCOUNTER — Encounter: Payer: Self-pay | Admitting: Family Medicine

## 2020-05-09 ENCOUNTER — Ambulatory Visit (INDEPENDENT_AMBULATORY_CARE_PROVIDER_SITE_OTHER): Payer: Medicare HMO | Admitting: Family Medicine

## 2020-05-09 ENCOUNTER — Other Ambulatory Visit: Payer: Self-pay

## 2020-05-09 VITALS — BP 130/82 | HR 75 | Temp 97.0°F | Wt 235.0 lb

## 2020-05-09 DIAGNOSIS — M7712 Lateral epicondylitis, left elbow: Secondary | ICD-10-CM

## 2020-05-09 DIAGNOSIS — Z23 Encounter for immunization: Secondary | ICD-10-CM | POA: Diagnosis not present

## 2020-05-09 NOTE — Progress Notes (Signed)
   Subjective:    Patient ID: Andrew Yang, male    DOB: Jun 11, 1951, 69 y.o.   MRN: [id number]  HPI He complains ofDifficulty with left elbow pain that started while playing golf.  He was unable to finish the full 18.  He has been using a splint to see if this will help.   Review of Systems     Objective:   Physical Exam Full motion of the left elbow.  Tender to palpation over the lateral epicondyle.  Provocative testing caused pain.       Assessment & Plan:  Lateral epicondylitis of left elbow - Plan: Ambulatory referral to Physical Therapy  Need for influenza vaccination - Plan: Flu Vaccine QUAD High Dose(Fluad)  Need for viral immunization - Plan: Pfizer SARS-COV-2 Vaccine Discussed the care of this.  Recommend ice initially followed by heat for 20 minutes 3 times per day.  He will switch to ibuprofen 800 mg 3 times per day.  Demonstrated proper use of the forearm compression.  Also as discussed doing his main things as he can palms up.  We will also refer to physical therapy for probable iontophoresis.  Discussed possibly changing the grips size.

## 2020-05-09 NOTE — Patient Instructions (Signed)
Tennis Elbow Tennis elbow is swelling (inflammation) in your outer forearm, near your elbow. Swelling affects the tissues that connect muscle to bone (tendons). Tennis elbow can happen in any sport or job in which you use your elbow too much. It is caused by doing the same motion over and over. Tennis elbow can cause:  Pain and tenderness in your forearm and the outer part of your elbow. You may have pain all the time, or only when using the arm.  A burning feeling. This runs from your elbow through your arm.  Weak grip in your hand. Follow these instructions at home: Activity  Rest your elbow and wrist. Avoid activities that cause problems, as told by your doctor.  If told by your doctor, wear an elbow strap to reduce stress on the area.  Do physical therapy exercises as told.  If you lift an object, lift it with your palm facing up. This is easier on your elbow. Lifestyle  If your tennis elbow is caused by sports, check your equipment and make sure that: ? You are using it correctly. ? It fits you well.  If your tennis elbow is caused by work or by using a computer, take breaks often to stretch your arm. Talk with your manager about how you can manage your condition at work. If you have a brace:  Wear the brace as told by your doctor. Remove it only as told by your doctor.  Loosen the brace if your fingers tingle, get numb, or turn cold and blue.  Keep the brace clean.  If the brace is not waterproof, ask your doctor if you may take the brace off for bathing. If you must keep the brace on while bathing: ? Do not let it get wet. ? Cover it with a watertight covering when you take a bath or a shower. General instructions   If told, put ice on the painful area: ? Put ice in a plastic bag. ? Place a towel between your skin and the bag. ? Leave the ice on for 20 minutes, 2-3 times a day.  Take over-the-counter and prescription medicines only as told by your doctor.  Keep  all follow-up visits as told by your doctor. This is important. Contact a doctor if:  Your pain does not get better with treatment.  Your pain gets worse.  You have weakness in your forearm, hand, or fingers.  You cannot feel your forearm, hand, or fingers. Summary  Tennis elbow is swelling (inflammation) in your outer forearm, near your elbow.  Tennis elbow is caused by doing the same motion over and over.  Rest your elbow and wrist. Avoid activities that cause problems, as told by your doctor.  If told, put ice on the painful area for 20 minutes, 2-3 times a day. This information is not intended to replace advice given to you by your health care provider. Make sure you discuss any questions you have with your health care provider. Document Revised: 04/22/2018 Document Reviewed: 05/11/2017 Elsevier Patient Education  [place]. 800 mg of ibuprofen 3 times per day.  Heat for 20 minutes 3 times per day.  Consider changing your grips to a little bit thicker grips.

## 2020-05-10 ENCOUNTER — Encounter: Payer: Self-pay | Admitting: Family Medicine

## 2020-05-10 DIAGNOSIS — M25522 Pain in left elbow: Secondary | ICD-10-CM | POA: Diagnosis not present

## 2020-05-13 ENCOUNTER — Other Ambulatory Visit: Payer: Self-pay

## 2020-05-13 ENCOUNTER — Telehealth: Payer: Self-pay

## 2020-05-13 NOTE — Telephone Encounter (Signed)
spoke to [facility] and rep advised that the dexamethasone was applied 1.0 ml  Via a patch. [place] advised it is dexxamethsone .4 % and comes in a 30 ml . [facility] was called in for pt and pharmacy stated they will contact pt because it will be filled before appt. Kh

## 2020-05-14 DIAGNOSIS — H524 Presbyopia: Secondary | ICD-10-CM | POA: Diagnosis not present

## 2020-05-14 DIAGNOSIS — Z961 Presence of intraocular lens: Secondary | ICD-10-CM | POA: Diagnosis not present

## 2020-05-14 DIAGNOSIS — H5213 Myopia, bilateral: Secondary | ICD-10-CM | POA: Diagnosis not present

## 2020-05-14 DIAGNOSIS — H52203 Unspecified astigmatism, bilateral: Secondary | ICD-10-CM | POA: Diagnosis not present

## 2020-05-15 DIAGNOSIS — R69 Illness, unspecified: Secondary | ICD-10-CM | POA: Diagnosis not present

## 2020-05-15 DIAGNOSIS — M25522 Pain in left elbow: Secondary | ICD-10-CM | POA: Diagnosis not present

## 2020-05-17 DIAGNOSIS — M25522 Pain in left elbow: Secondary | ICD-10-CM | POA: Diagnosis not present

## 2020-05-20 ENCOUNTER — Other Ambulatory Visit: Payer: Medicare HMO

## 2020-05-21 ENCOUNTER — Other Ambulatory Visit: Payer: Self-pay

## 2020-05-21 ENCOUNTER — Other Ambulatory Visit: Payer: Medicare HMO

## 2020-05-21 DIAGNOSIS — Z125 Encounter for screening for malignant neoplasm of prostate: Secondary | ICD-10-CM

## 2020-05-21 DIAGNOSIS — Z0000 Encounter for general adult medical examination without abnormal findings: Secondary | ICD-10-CM

## 2020-05-21 DIAGNOSIS — I10 Essential (primary) hypertension: Secondary | ICD-10-CM | POA: Diagnosis not present

## 2020-05-21 DIAGNOSIS — L821 Other seborrheic keratosis: Secondary | ICD-10-CM | POA: Diagnosis not present

## 2020-05-21 DIAGNOSIS — I700 Atherosclerosis of aorta: Secondary | ICD-10-CM | POA: Diagnosis not present

## 2020-05-21 DIAGNOSIS — Z85828 Personal history of other malignant neoplasm of skin: Secondary | ICD-10-CM | POA: Diagnosis not present

## 2020-05-21 DIAGNOSIS — M25522 Pain in left elbow: Secondary | ICD-10-CM | POA: Diagnosis not present

## 2020-05-21 DIAGNOSIS — D2371 Other benign neoplasm of skin of right lower limb, including hip: Secondary | ICD-10-CM | POA: Diagnosis not present

## 2020-05-21 DIAGNOSIS — R7302 Impaired glucose tolerance (oral): Secondary | ICD-10-CM

## 2020-05-21 DIAGNOSIS — L570 Actinic keratosis: Secondary | ICD-10-CM | POA: Diagnosis not present

## 2020-05-21 DIAGNOSIS — L820 Inflamed seborrheic keratosis: Secondary | ICD-10-CM | POA: Diagnosis not present

## 2020-05-21 DIAGNOSIS — D485 Neoplasm of uncertain behavior of skin: Secondary | ICD-10-CM | POA: Diagnosis not present

## 2020-05-21 DIAGNOSIS — R768 Other specified abnormal immunological findings in serum: Secondary | ICD-10-CM

## 2020-05-21 HISTORY — PX: SKIN BIOPSY: SHX1

## 2020-05-22 LAB — CBC WITH DIFFERENTIAL/PLATELET
Basophils Absolute: 0.1 10*3/uL (ref 0.0–0.2)
Basos: 1 %
EOS (ABSOLUTE): 0.2 10*3/uL (ref 0.0–0.4)
Eos: 3 %
Hematocrit: 43.2 % (ref 37.5–51.0)
Hemoglobin: 14.7 g/dL (ref 13.0–17.7)
Immature Grans (Abs): 0 10*3/uL (ref 0.0–0.1)
Immature Granulocytes: 0 %
Lymphocytes Absolute: 1.8 10*3/uL (ref 0.7–3.1)
Lymphs: 32 %
MCH: 31.1 pg (ref 26.6–33.0)
MCHC: 34 g/dL (ref 31.5–35.7)
MCV: 92 fL (ref 79–97)
Monocytes Absolute: 0.7 10*3/uL (ref 0.1–0.9)
Monocytes: 12 %
Neutrophils Absolute: 2.9 10*3/uL (ref 1.4–7.0)
Neutrophils: 52 %
Platelets: 185 10*3/uL (ref 150–450)
RBC: 4.72 x10E6/uL (ref 4.14–5.80)
RDW: 13.1 % (ref 11.6–15.4)
WBC: 5.6 10*3/uL (ref 3.4–10.8)

## 2020-05-22 LAB — COMPREHENSIVE METABOLIC PANEL
ALT: 31 IU/L (ref 0–44)
AST: 23 IU/L (ref 0–40)
Albumin/Globulin Ratio: 2.1 (ref 1.2–2.2)
Albumin: 4.6 g/dL (ref 3.8–4.8)
Alkaline Phosphatase: 76 IU/L (ref 44–121)
BUN/Creatinine Ratio: 17 (ref 10–24)
BUN: 15 mg/dL (ref 8–27)
Bilirubin Total: 0.3 mg/dL (ref 0.0–1.2)
CO2: 26 mmol/L (ref 20–29)
Calcium: 9.3 mg/dL (ref 8.6–10.2)
Chloride: 102 mmol/L (ref 96–106)
Creatinine, Ser: 0.9 mg/dL (ref 0.76–1.27)
GFR calc Af Amer: 100 mL/min/{1.73_m2} (ref >59)
GFR calc non Af Amer: 87 mL/min/{1.73_m2} (ref >59)
Globulin, Total: 2.2 g/dL (ref 1.5–4.5)
Glucose: 113 mg/dL — ABNORMAL HIGH (ref 65–99)
Potassium: 4.6 mmol/L (ref 3.5–5.2)
Sodium: 140 mmol/L (ref 134–144)
Total Protein: 6.8 g/dL (ref 6.0–8.5)

## 2020-05-22 LAB — LIPID PANEL
Chol/HDL Ratio: 5.4 ratio — ABNORMAL HIGH (ref 0.0–5.0)
Cholesterol, Total: 210 mg/dL — ABNORMAL HIGH (ref 100–199)
HDL: 39 mg/dL — ABNORMAL LOW (ref >39)
LDL Chol Calc (NIH): 116 mg/dL — ABNORMAL HIGH (ref 0–99)
Triglycerides: 316 mg/dL — ABNORMAL HIGH (ref 0–149)
VLDL Cholesterol Cal: 55 mg/dL — ABNORMAL HIGH (ref 5–40)

## 2020-05-22 LAB — PSA: Prostate Specific Ag, Serum: 0.4 ng/mL (ref 0.0–4.0)

## 2020-05-23 DIAGNOSIS — M25522 Pain in left elbow: Secondary | ICD-10-CM | POA: Diagnosis not present

## 2020-05-23 LAB — HEPATITIS C VRS RNA DETECT BY PCR-QUAL: HCV RNA NAA Qualitative: NEGATIVE

## 2020-05-27 ENCOUNTER — Other Ambulatory Visit: Payer: Self-pay

## 2020-05-27 ENCOUNTER — Encounter: Payer: Self-pay | Admitting: Family Medicine

## 2020-05-27 ENCOUNTER — Ambulatory Visit (INDEPENDENT_AMBULATORY_CARE_PROVIDER_SITE_OTHER): Payer: Medicare HMO | Admitting: Family Medicine

## 2020-05-27 VITALS — BP 132/80 | HR 69 | Temp 97.8°F | Ht 72.75 in | Wt 238.8 lb

## 2020-05-27 DIAGNOSIS — I7090 Unspecified atherosclerosis: Secondary | ICD-10-CM

## 2020-05-27 DIAGNOSIS — I10 Essential (primary) hypertension: Secondary | ICD-10-CM

## 2020-05-27 DIAGNOSIS — N529 Male erectile dysfunction, unspecified: Secondary | ICD-10-CM | POA: Diagnosis not present

## 2020-05-27 DIAGNOSIS — G4710 Hypersomnia, unspecified: Secondary | ICD-10-CM

## 2020-05-27 DIAGNOSIS — E785 Hyperlipidemia, unspecified: Secondary | ICD-10-CM | POA: Diagnosis not present

## 2020-05-27 DIAGNOSIS — Z87891 Personal history of nicotine dependence: Secondary | ICD-10-CM

## 2020-05-27 DIAGNOSIS — J309 Allergic rhinitis, unspecified: Secondary | ICD-10-CM | POA: Diagnosis not present

## 2020-05-27 DIAGNOSIS — R7302 Impaired glucose tolerance (oral): Secondary | ICD-10-CM | POA: Diagnosis not present

## 2020-05-27 DIAGNOSIS — M549 Dorsalgia, unspecified: Secondary | ICD-10-CM | POA: Diagnosis not present

## 2020-05-27 DIAGNOSIS — Z860100 Personal history of colon polyps, unspecified: Secondary | ICD-10-CM

## 2020-05-27 DIAGNOSIS — L570 Actinic keratosis: Secondary | ICD-10-CM

## 2020-05-27 DIAGNOSIS — G4730 Sleep apnea, unspecified: Secondary | ICD-10-CM

## 2020-05-27 DIAGNOSIS — G4733 Obstructive sleep apnea (adult) (pediatric): Secondary | ICD-10-CM

## 2020-05-27 DIAGNOSIS — I700 Atherosclerosis of aorta: Secondary | ICD-10-CM

## 2020-05-27 DIAGNOSIS — Z9989 Dependence on other enabling machines and devices: Secondary | ICD-10-CM

## 2020-05-27 DIAGNOSIS — R768 Other specified abnormal immunological findings in serum: Secondary | ICD-10-CM | POA: Diagnosis not present

## 2020-05-27 DIAGNOSIS — Z0000 Encounter for general adult medical examination without abnormal findings: Secondary | ICD-10-CM

## 2020-05-27 DIAGNOSIS — M1990 Unspecified osteoarthritis, unspecified site: Secondary | ICD-10-CM

## 2020-05-27 DIAGNOSIS — G8929 Other chronic pain: Secondary | ICD-10-CM

## 2020-05-27 DIAGNOSIS — F341 Dysthymic disorder: Secondary | ICD-10-CM

## 2020-05-27 MED ORDER — VENLAFAXINE HCL 75 MG PO TABS: 75.0000 mg | ORAL_TABLET | Freq: Two times a day (BID) | ORAL | 3 refills | Status: DC

## 2020-05-27 MED ORDER — LOSARTAN POTASSIUM-HCTZ 100-12.5 MG PO TABS: 1.0000 | ORAL_TABLET | Freq: Every day | ORAL | 3 refills | Status: DC

## 2020-05-27 MED ORDER — CELECOXIB 200 MG PO CAPS: ORAL_CAPSULE | ORAL | 5 refills | Status: DC

## 2020-05-27 MED ORDER — ROSUVASTATIN CALCIUM 40 MG PO TABS: 40.0000 mg | ORAL_TABLET | Freq: Every day | ORAL | 3 refills | Status: DC

## 2020-05-27 NOTE — Progress Notes (Signed)
Andrew Yang is a 69 y.o. male who presents for annual wellness visit,CPE and follow-up on chronic medical conditions.  He is quite concerned about his weight gain.  He talks about possibly starting a dietary program.  He states that he wants to lose roughly 15 to 18 pounds.  He apparently used hypnosis to help with cigarettes several years ago and is considering doing this again.  He does exercise but does have difficulty with his back and is using Celebrex to help with that.  He also has sleep apnea and is using his CPAP.  He does have underlying allergies but presently is on no medication.  Also has a previous history of glucose intolerance.  He continues on Crestor for his atherosclerosis.  He is also taking losartan/HCTZ for blood pressure.  He has been on Effexor for quite some time to help deal with dysthymia.  He does have a history of erectile dysfunction but not presently on any medication.  He also has a previous history of hep C with a negative test recently.  He is followed by Dr. Earlean Shawl for his colonic polyps.   Immunizations and Health Maintenance Immunization History  Administered Date(s) Administered  . Fluad Quad(high Dose 65+) 04/18/2019, 05/09/2020  . Hepatitis A 10/23/1999, 05/05/2000  . Hepatitis B 10/13/1999, 12/24/1999, 05/05/2000  . IPV 04/26/2008  . Influenza Whole 07/25/2007, 04/09/2011  . Influenza, High Dose Seasonal PF 07/21/2016, 06/04/2017, 04/25/2018  . Influenza, Seasonal, Injecte, Preservative Fre 07/26/2012  . Influenza,inj,Quad PF,6+ Mos 04/17/2014, 06/13/2015  . MMR 07/15/2007  . Meningococcal Conjugate 04/26/2008  . PFIZER SARS-COV-2 Vaccination 09/16/2019, 10/11/2019, 05/09/2020  . Pneumococcal Conjugate-13 10/26/2016  . Pneumococcal Polysaccharide-23 11/15/2017  . Td 01/05/1984, 05/16/2004, 04/26/2008  . Tdap 04/26/2008, 07/26/2012, 12/17/2019  . Typhoid Inactivated 04/26/2008  . Yellow Fever 04/26/2008  . Zoster 07/26/2012  . Zoster Recombinat  (Shingrix) 10/27/2017, 05/23/2019, 05/23/2019   There are no preventive care reminders to display for this patient.  Last colonoscopy: 04/20/11 Last PSA:  05/21/20 Dentist: Q six months Ophtho: Q year Exercise: bike, calisthenics 30 mins 7 days a week  Other doctors caring for patient include: Dr. Richardean Sale eye, Dr. Earlean Shawl Barbra Sarks NP neurology, Dr. Ronnald Ramp derma  Advanced Directives: Does Patient Have a Medical Advance Directive?: Yes Type of Advance Directive: Healthcare Power of Attorney, Living will [place] in Chart?: Yes - validated most recent copy scanned in chart (See row information)  Depression screen:  See questionnaire below.     Depression screen [facility] 2/9 05/27/2020 12/13/2018 11/15/2017 10/26/2016 06/26/2015  Decreased Interest 0 0 0 0 0  Down, Depressed, Hopeless 0 0 0 0 0  PHQ - 2 Score 0 0 0 0 0    Fall Screen: See Questionaire below.   Fall Risk  05/27/2020 11/15/2017 07/29/2017 10/26/2016 06/26/2015  Falls in the past year? 0 No No No No    ADL screen:  See questionnaire below.  Functional Status Survey: Is the patient deaf or have difficulty hearing?: Yes Does the patient have difficulty seeing, even when wearing glasses/contacts?: No Does the patient have difficulty concentrating, remembering, or making decisions?: No Does the patient have difficulty walking or climbing stairs?: No Does the patient have difficulty dressing or bathing?: No Does the patient have difficulty doing errands alone such as visiting a doctor's office or shopping?: No   Review of Systems  Constitutional: -, -unexpected weight change, -anorexia, -fatigue Allergy: -sneezing, -itching, -congestion Dermatology: denies changing moles, rash, lumps ENT: -  runny nose, -ear pain, -sore throat,  Cardiology:  -chest pain, -palpitations, -orthopnea, Respiratory: -cough, -shortness of breath, -dyspnea on exertion, -wheezing,  Gastroenterology: -abdominal pain, -nausea,  -vomiting, -diarrhea, -constipation, -dysphagia Hematology: -bleeding or bruising problems Musculoskeletal: -arthralgias, -myalgias, -joint swelling, -back pain, - Ophthalmology: -vision changes,  Urology: -dysuria, -difficulty urinating,  -urinary frequency, -urgency, incontinence Neurology: -, -numbness, , -memory loss, -falls, -dizziness    PHYSICAL EXAM:   General Appearance: Alert, cooperative, no distress, appears stated age Head: Normocephalic, without obvious abnormality, atraumatic Eyes: PERRL, conjunctiva/corneas clear, EOM's intact Ears: Normal TM's and external ear canals Nose: Nares normal, mucosa normal, no drainage or sinus   tenderness Throat: Lips, mucosa, and tongue normal; teeth and gums normal Neck: Supple, no lymphadenopathy, thyroid:no enlargement/tenderness/nodules; no carotid bruit or JVD Lungs: Clear to auscultation bilaterally without wheezes, rales or ronchi; respirations unlabored Heart: Regular rate and rhythm, S1 and S2 normal, no murmur, rub or gallop Abdomen: Soft, non-tender, nondistended, normoactive bowel sounds, no masses, no hepatosplenomegaly Extremities: No clubbing, cyanosis or edema Pulses: 2+ and symmetric all extremities Skin: Skin color, texture, turgor normal, dry scaly lesions noted on forehead Lymph nodes: Cervical, supraclavicular, and axillary nodes normal Neurologic: CNII-XII intact, normal strength, sensation and gait; reflexes 2+ and symmetric throughout   Psych: Normal mood, affect, hygiene and grooming  A1c 5.9 ASSESSMENT/PLAN: Routine general medical examination at a health care facility  Glucose intolerance (impaired glucose tolerance)  Chronic back pain, unspecified back location, unspecified back pain laterality - Plan: celecoxib (CELEBREX) 200 MG capsule  Erectile dysfunction, unspecified erectile dysfunction type  Hepatitis C antibody test positive  Hyperlipidemia with target low density lipoprotein (LDL) cholesterol  less than 100 mg/dL - Plan: rosuvastatin (CRESTOR) 40 MG tablet  Chronic allergic rhinitis  Arthritis  Hypersomnia with sleep apnea  Atherosclerosis of aorta (HCC)  Obstructive sleep apnea treated with continuous positive airway pressure (CPAP)  Former smoker - Quit 2017  History of colonic polyps  Dysthymia - Plan: venlafaxine (EFFEXOR) 75 MG tablet  Atherosclerosis - Plan: rosuvastatin (CRESTOR) 40 MG tablet  Essential hypertension - Plan: losartan-hydrochlorothiazide (HYZAAR) 100-12.5 MG tablet  Actinic keratosis I had a long discussion with him concerning diet and exercise.  Also discussed glucose intolerance with him since his A1c is 5.9. He will call me when he needs a refill on his Cialis.  No further intervention needed concerning hepatitis C.  Allergies are not causing any difficulty.  Continue to treat the arthritis with Celebrex. Follow-up with Dr. Earlean Shawl concerning colonoscopy.  Continue on CPAP.  AAA eval already done.  He will continue to be followed by dermatology.   recommended at least 30 minutes of aerobic activity at least 5 days/week; proper sunscreen use reviewed; healthy diet and alcohol recommendations (less than or equal to 2 drinks/day) reviewed;  Immunization recommendations discussed.  Colonoscopy recommendations reviewed.   Medicare Attestation I have personally reviewed: The patient's medical and social history Their use of alcohol, tobacco or illicit drugs Their current medications and supplements The patient's functional ability including ADLs,fall risks, home safety risks, cognitive, and hearing and visual impairment Diet and physical activities Evidence for depression or mood disorders  The patient's weight, height, and BMI have been recorded in the chart.  I have made referrals, counseling, and provided education to the patient based on review of the above and I have provided the patient with a written personalized care plan for preventive  services.     Jill Alexanders, MD   05/27/2020

## 2020-05-27 NOTE — Patient Instructions (Signed)
  Andrew Yang , Thank you for taking time to come for your Medicare Wellness Visit. I appreciate your ongoing commitment to your health goals. Please review the following plan we discussed and let me know if I can assist you in the future.   These are the goals we discussed: Continue on present medications.  Work diligently on diet and exercise to try to get down to a size of 36 This is a list of the screening recommended for you and due dates:  Health Maintenance  Topic Date Due  . Colon Cancer Screening  04/19/2021  . Tetanus Vaccine  12/16/2029  . Flu Shot  Completed  . COVID-19 Vaccine  Completed  .  Hepatitis C: One time screening is recommended by Center for Disease Control  (CDC) for  adults born from [birth year] through 1965.   Completed  . Pneumonia vaccines  Completed

## 2020-05-28 DIAGNOSIS — M25522 Pain in left elbow: Secondary | ICD-10-CM | POA: Diagnosis not present

## 2020-05-29 ENCOUNTER — Encounter: Payer: Self-pay | Admitting: Family Medicine

## 2020-07-09 DIAGNOSIS — M25561 Pain in right knee: Secondary | ICD-10-CM | POA: Diagnosis not present

## 2020-07-09 DIAGNOSIS — G8929 Other chronic pain: Secondary | ICD-10-CM | POA: Diagnosis not present

## 2020-07-16 IMAGING — DX DG HIP (WITH OR WITHOUT PELVIS) 2-3V*R*
2 series · 2 of 2 positions shown · non-contrast
Comparison: None.

CLINICAL DATA: Right hip pain for several weeks. No known injury.

EXAM:
DG HIP (WITH OR WITHOUT PELVIS) 2-3V RIGHT

[dg hip unilat w or w/o pelvis 2-3 views  (1 of 2)]
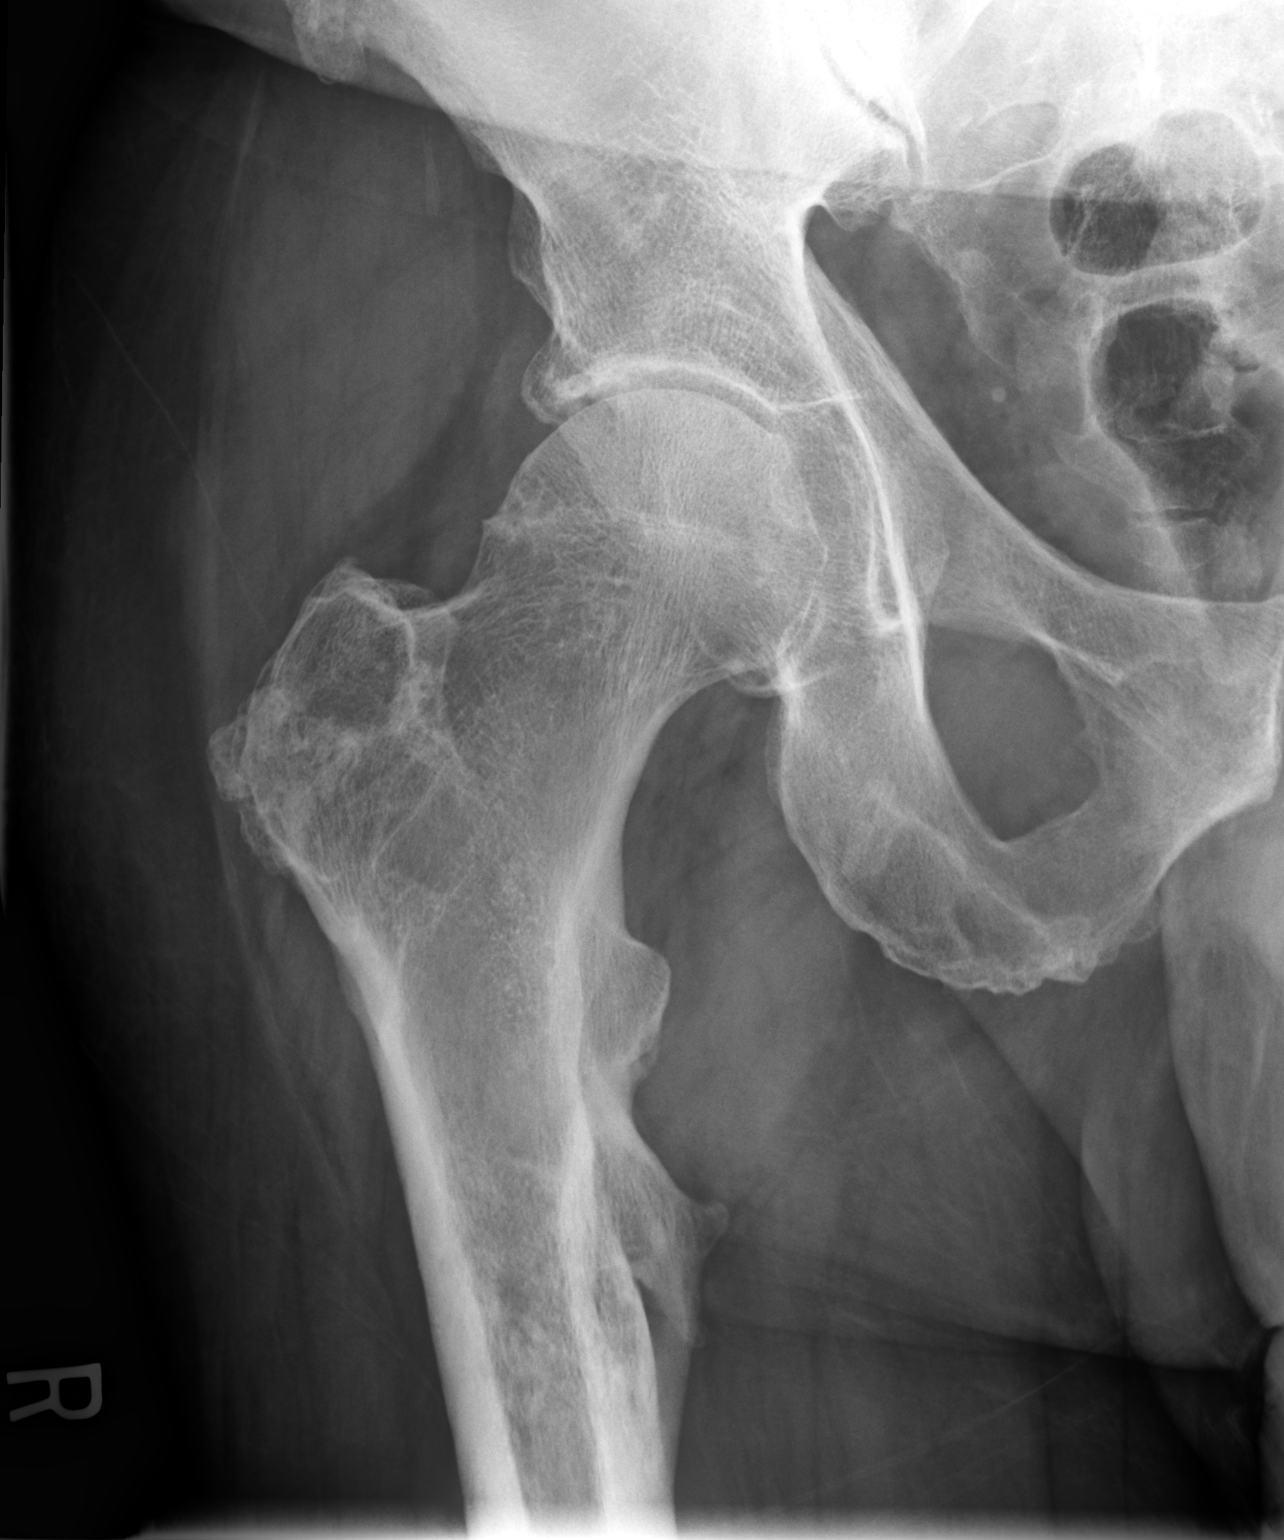

[dg hip unilat w or w/o pelvis 2-3 views  (2 of 2)]
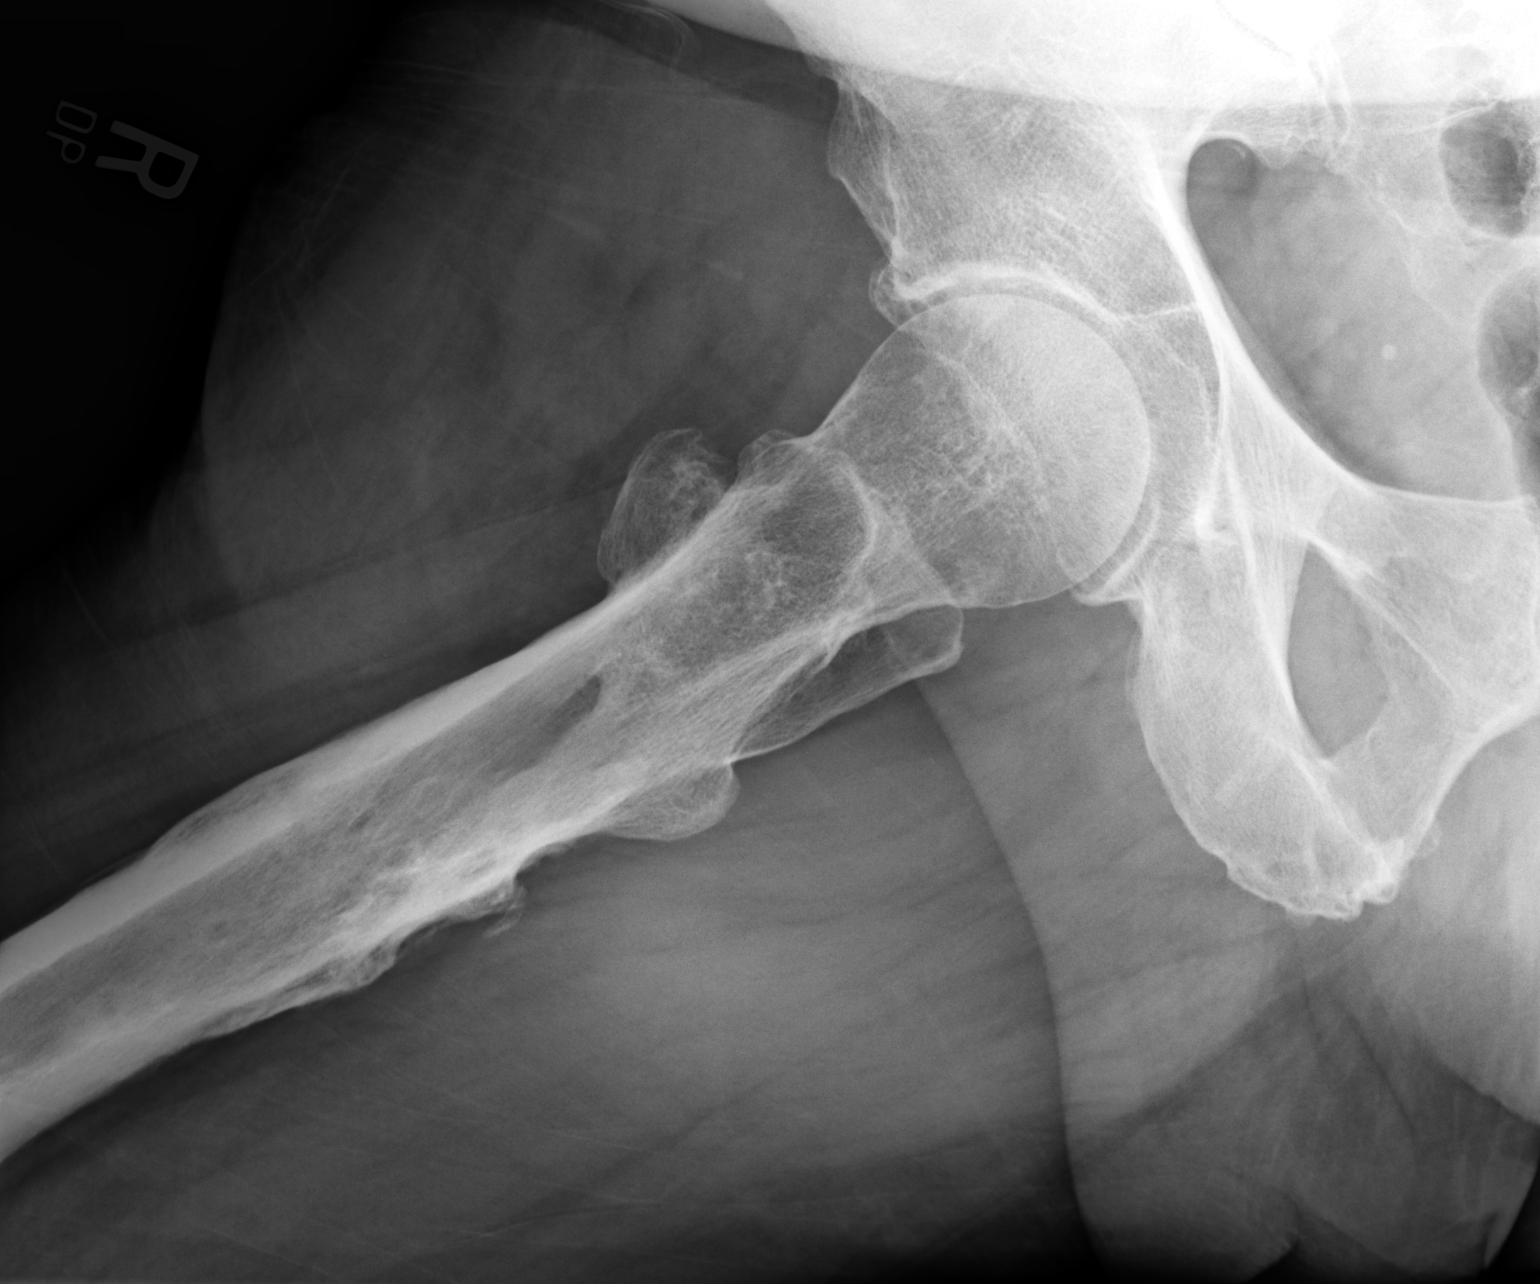

[2 of 2 positions shown; findings below may reference images not displayed]

FINDINGS: There is no acute fracture or dislocation. Minimal degenerative
changes of the superolateral aspect of the right acetabulum. Old
deformity of the proximal right femoral shaft consistent with old
healed fracture. Bony excrescence from the posterior aspect of the
greater trochanter is probably also related to prior trauma.
IMPRESSION: Minimal degenerative changes of the right hip. Old posttraumatic
changes of the proximal right femur.

## 2020-07-16 IMAGING — DX DG LUMBAR SPINE COMPLETE 4+V
5 series · 5 of 5 positions shown · non-contrast
Comparison: None.

CLINICAL DATA: Low back pain and right hip pain. No recent injury.

EXAM:
LUMBAR SPINE - COMPLETE 4+ VIEW

[dg lumbar spine complete 4 +v (1 of 5)]
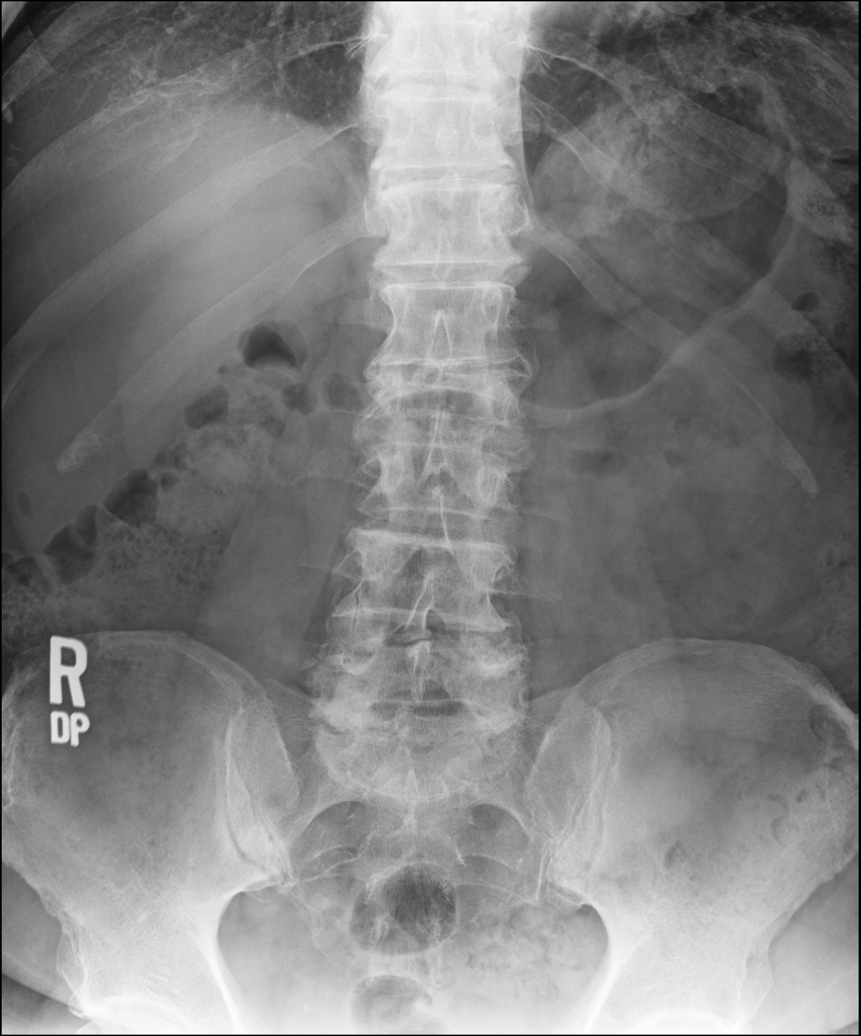

[dg lumbar spine complete 4 +v (2 of 5)]
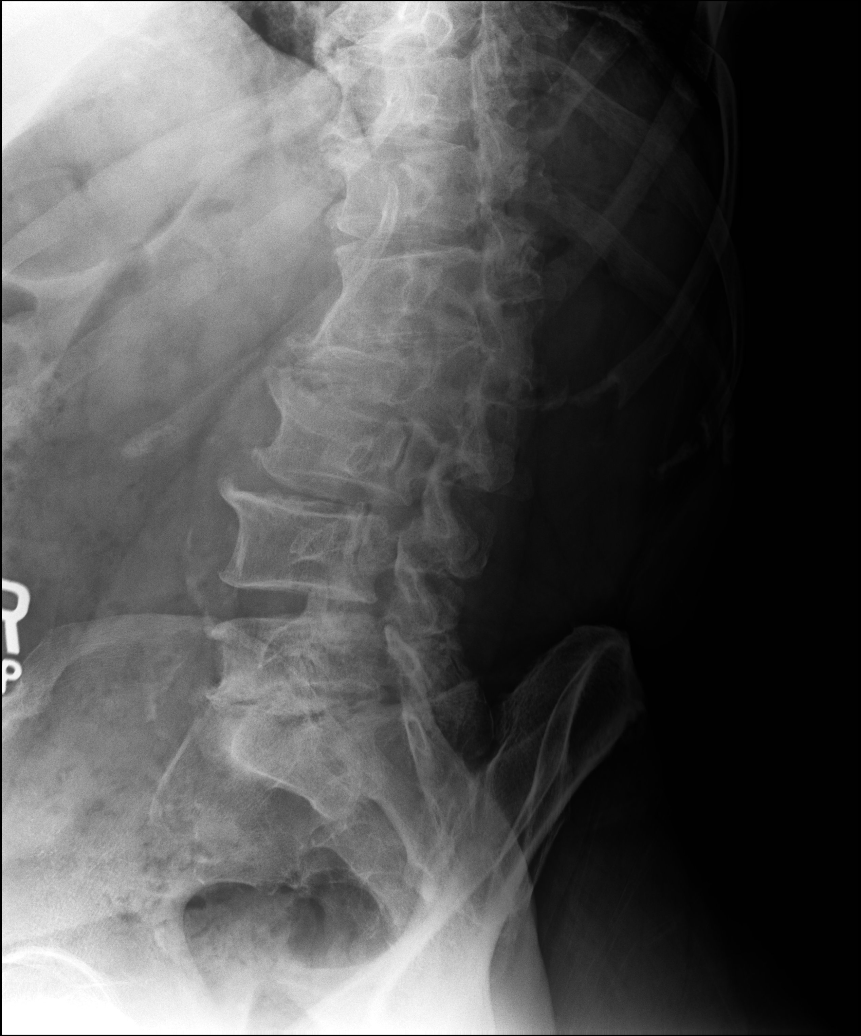

[dg lumbar spine complete 4 +v (3 of 5)]
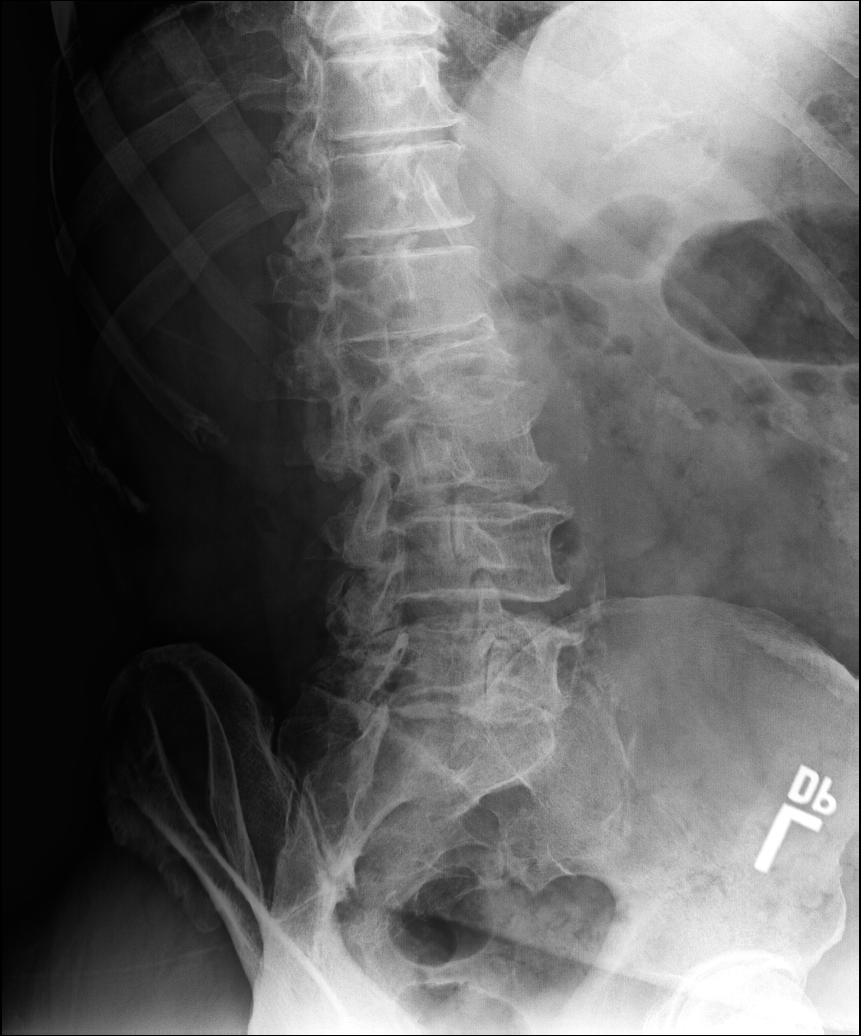

[dg lumbar spine complete 4 +v (4 of 5)]
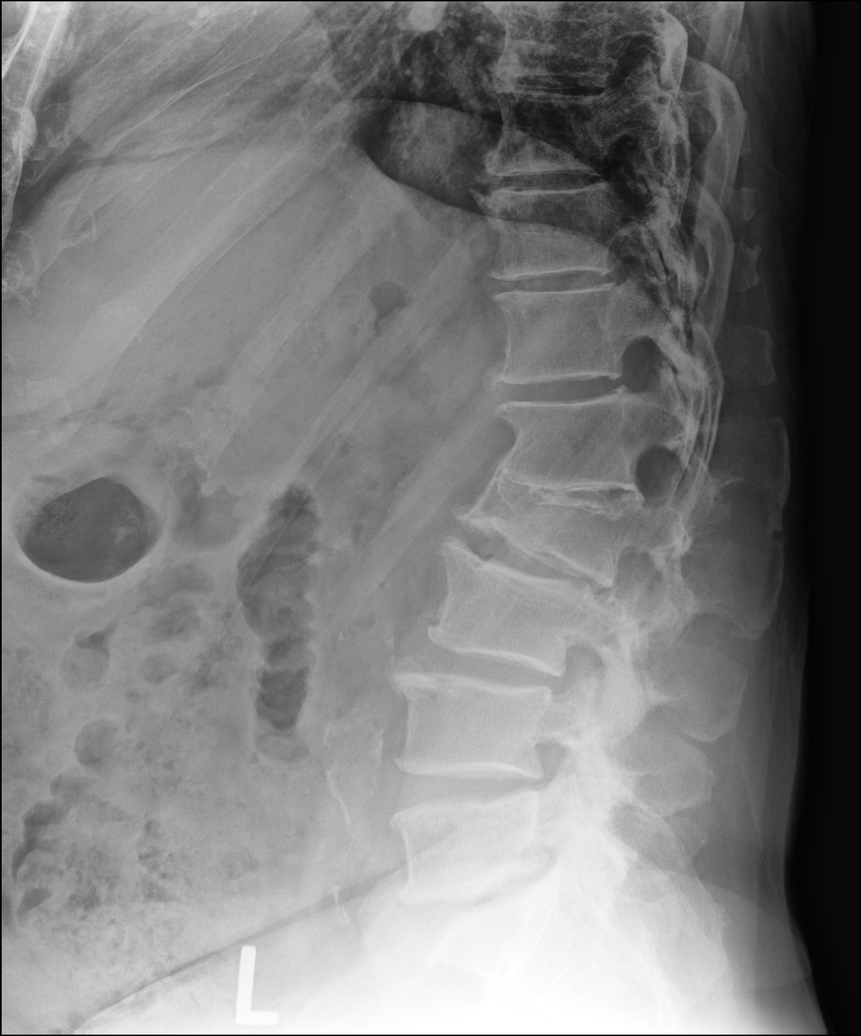

[dg lumbar spine complete 4 +v (5 of 5)]
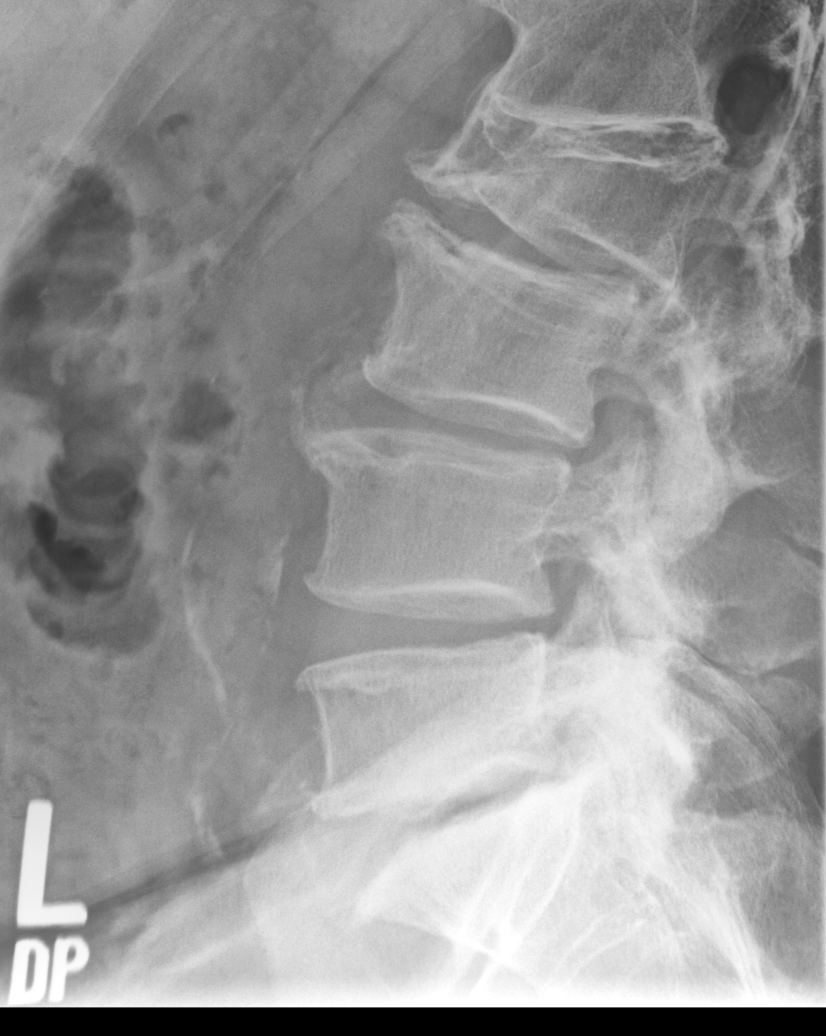

[5 of 5 positions shown; findings below may reference images not displayed]

FINDINGS: There is an old moderately severe compression fracture of L2. There
is slight retrolisthesis of L2 on L3. Degenerative disc disease at
L1-2 and L2-3 and L5-S1 with disc space narrowing. Severe bilateral
facet arthritis at L5-S1. Moderately severe facet arthritis at L4-5.

Aortic atherosclerosis.
IMPRESSION: 1. Multilevel degenerative disc and joint disease as described. Old
compression fracture of L2.
2. Aortic atherosclerosis.

## 2020-07-23 DIAGNOSIS — G4733 Obstructive sleep apnea (adult) (pediatric): Secondary | ICD-10-CM | POA: Diagnosis not present

## 2020-07-31 ENCOUNTER — Ambulatory Visit (INDEPENDENT_AMBULATORY_CARE_PROVIDER_SITE_OTHER): Payer: Medicare HMO | Admitting: Adult Health

## 2020-07-31 VITALS — BP 131/83 | HR 68 | Ht 72.0 in | Wt 211.0 lb

## 2020-07-31 DIAGNOSIS — Z9989 Dependence on other enabling machines and devices: Secondary | ICD-10-CM | POA: Diagnosis not present

## 2020-07-31 DIAGNOSIS — G4733 Obstructive sleep apnea (adult) (pediatric): Secondary | ICD-10-CM | POA: Diagnosis not present

## 2020-07-31 NOTE — Patient Instructions (Signed)

## 2020-07-31 NOTE — Progress Notes (Signed)
.meg    PATIENT: Andrew Yang DOB: 1950/10/11  REASON FOR VISIT: follow up HISTORY FROM: patient  HISTORY OF PRESENT ILLNESS: Today 07/31/20:  [name] is a 69 year old male with a history of obstructive sleep apnea on CPAP.  His download indicates that he uses machine nightly for compliance of 100%.  He uses machine greater than 4 hours 23 days for compliance of 77%.  On average he uses his machine 5 hours and 42 minutes.  His residual AHI is 0.6 on 9 cm of water with EPR 1.  Leak in the 95th percentile is 24.1 L/min.  Overall he feels that he is doing well.  He returns today for an evaluation.  08/01/19:[name] is a 69 year old male with a history of obstructive sleep apnea on CPAP.  He returns today for follow-up.  His download indicates that he uses machine 30 out of 30 days for compliance of 100%.  He uses machine greater than 4 hours 29 days for compliance of 97%.  On average he uses his machine 7 hours and 7 minutes.  His residual AHI is 2.1 on 9 cm of water with EPR of 1.  His leak in the 95th percentile is 14.8 L/min.  Reports that the CPAP works well for him.  He denies any new issues.  He returns today for an evaluation.  HISTORY 12/18/2019CM [name], 69 year old male returns for follow-up with history of obstructive sleep apnea here for CPAP compliance.  He has been doing well with his CPAP.  Data dated 06/26/2018-07/25/2018 shows compliance greater than 4 hours at 87%.  Usage days 97%.  Average usage 6 hours 31 minutes.  Set pressure of 9 cm leak 95th percentile 10.7.  AHI 1.1.  ESS 5 fatigue severity scale 12.  He returns for reevaluation  REVIEW OF SYSTEMS: Out of a complete 14 system review of symptoms, the patient complains only of the following symptoms, and all other reviewed systems are negative.   ESS 5 FSS 9  ALLERGIES: Allergies  Allergen Reactions  . Lisinopril Cough    HOME MEDICATIONS: Outpatient Medications Prior to Visit  Medication Sig Dispense  Refill  . acetaminophen (TYLENOL) 500 MG tablet Take 1,000 mg by mouth daily.     Marland Kitchen Apoaequorin (PREVAGEN PO) Take by mouth.    . Ascorbic Acid (VITAMIN C) 1000 MG tablet Take 1,000 mg by mouth daily.    . celecoxib (CELEBREX) 200 MG capsule TAKE 1 CAPSULE BY MOUTH TWICE A DAY 30 capsule 5  . cholecalciferol (VITAMIN D3) 25 MCG (1000 UT) tablet Take 1,000 Units by mouth daily.    . Clobetasol Propionate 0.05 % shampoo SMARTSIG:1 Topical Twice a Week PRN    . Ginkgo Biloba Extract 60 MG CAPS Take 120 mg by mouth daily.    Marland Kitchen GINSENG PO Take 2 tablets by mouth daily.    . Glucosamine-Chondroitin (GLUCOSAMINE CHONDR COMPLEX PO) Take 2 tablets by mouth daily.     Marland Kitchen losartan-hydrochlorothiazide (HYZAAR) 100-12.5 MG tablet Take 1 tablet by mouth daily. 90 tablet 3  . metaxalone (SKELAXIN) 800 MG tablet TAKE 1 TABLET (800 MG TOTAL) THREE TIMES A DAY BY MOUTH AS NEEDED 90 tablet 0  . Multiple Vitamin (MULTIVITAMIN WITH MINERALS) TABS tablet Take 1 tablet by mouth daily.    . rosuvastatin (CRESTOR) 40 MG tablet Take 1 tablet (40 mg total) by mouth daily. 90 tablet 3  . tadalafil (CIALIS) 20 MG tablet Take 1 tablet (20 mg total) by mouth daily as  needed for erectile dysfunction. 10 tablet 11  . venlafaxine (EFFEXOR) 75 MG tablet Take 1 tablet (75 mg total) by mouth 2 (two) times daily. 180 tablet 3  . Butalbital-APAP-Caffeine 50-300-40 MG CAPS Take 50 mg by mouth as needed. (Patient not taking: Reported on 07/31/2020) 30 capsule 2  . ciprofloxacin (CIPRO) 500 MG tablet Take 1 tablet (500 mg total) by mouth 2 (two) times daily. (Patient not taking: Reported on 08/25/2019) 20 tablet 0  . ibuprofen (ADVIL) 200 MG tablet Take 600 mg by mouth daily.  (Patient not taking: Reported on 05/27/2020)    . methylPREDNISolone (MEDROL DOSEPAK) 4 MG TBPK tablet Take as directed (Patient not taking: Reported on 10/24/2019) 21 each 0   No facility-administered medications prior to visit.    PAST MEDICAL HISTORY: Past  Medical History:  Diagnosis Date  . Allergy    RHINITIS  . Colonic polyp   . Diverticulosis   . Dyslipidemia   . Hemorrhoids   . Hepatitis C    treated with interferon and Ribiviran  with negative viral loads  . Hypertension   . Pneumonia    as a child  . Sleep apnea   . Sleep apnea    CPAP  . Smoker     PAST SURGICAL HISTORY: Past Surgical History:  Procedure Laterality Date  . ANTERIOR CRUCIATE LIGAMENT REPAIR  2006  . EYE SURGERY     cataract 2018  bil ---1/ 2019  lasik right eye 2019  -- 2020 laser left eye removed a film growing over cataract  . SKIN BIOPSY Left    Occipital scalp shave &ED&C  basal cell carinoma, nodular pattern  . SKIN BIOPSY Right 05/24/2019   squamous cell carcinoma, keratoacanthoma type,features of regression  . SKIN BIOPSY Bilateral 05/21/2020   Hypertrophic actinic keratosis  . UMBILICAL HERNIA REPAIR N/A 04/26/2019   Procedure: UMBILICAL HERNIA REPAIR WITH MESH;  Surgeon: Coralie Keens, MD;  Location: [facility];  Service: General;  Laterality: N/A;    FAMILY HISTORY: Family History  Problem Relation Age of Onset  . Arthritis Mother   . Hypertension Father   . Heart disease Maternal Grandfather     SOCIAL HISTORY: Social History   Socioeconomic History  . Marital status: Married    Spouse name: Not on file  . Number of children: Not on file  . Years of education: Not on file  . Highest education level: Not on file  Occupational History  . Not on file  Tobacco Use  . Smoking status: Former Smoker    Quit date: 10/26/2016    Years since quitting: 3.7  . Smokeless tobacco: Never Used  Vaping Use  . Vaping Use: Never used  Substance and Sexual Activity  . Alcohol use: Yes    Alcohol/week: 15.0 standard drinks    Types: 15 Cans of beer per week  . Drug use: No  . Sexual activity: Yes  Other Topics Concern  . Not on file  Social History Narrative  . Not on file   Social Determinants of Health   Financial Resource Strain:  Not on file  Food Insecurity: Not on file  Transportation Needs: Not on file  Physical Activity: Not on file  Stress: Not on file  Social Connections: Not on file  Intimate Partner Violence: Not on file      PHYSICAL EXAM  Vitals:   07/31/20 1023  BP: 131/83  Pulse: 68  Weight: 211 lb (95.7 kg)  Height: 6' (1.829 m)  Body mass index is 28.62 kg/m.  Generalized: Well developed, in no acute distress  Chest: Lungs clear to auscultation bilaterally  Neurological examination  Mentation: Alert oriented to time, place, history taking. Follows all commands speech and language fluent Cranial nerve II-XII: Extraocular movements were full, visual field were full on confrontational test Head turning and shoulder shrug  were normal and symmetric. Motor: The motor testing reveals 5 over 5 strength of all 4 extremities. Good symmetric motor tone is noted throughout.  Sensory: Sensory testing is intact to soft touch on all 4 extremities. No evidence of extinction is noted.  Gait and station: Gait is normal.    DIAGNOSTIC DATA (LABS, IMAGING, TESTING) - I reviewed patient records, labs, notes, testing and imaging myself where available.  Lab Results  Component Value Date   WBC 5.6 05/21/2020   HGB 14.7 05/21/2020   HCT 43.2 05/21/2020   MCV 92 05/21/2020   PLT 185 05/21/2020      Component Value Date/Time   NA 140 05/21/2020 1158   K 4.6 05/21/2020 1158   CL 102 05/21/2020 1158   CO2 26 05/21/2020 1158   GLUCOSE 113 (H) 05/21/2020 1158   GLUCOSE 103 (H) 04/19/2019 1104   BUN 15 05/21/2020 1158   CREATININE 0.90 05/21/2020 1158   CREATININE 1.10 10/08/2016 1459   CALCIUM 9.3 05/21/2020 1158   PROT 6.8 05/21/2020 1158   ALBUMIN 4.6 05/21/2020 1158   AST 23 05/21/2020 1158   ALT 31 05/21/2020 1158   ALKPHOS 76 05/21/2020 1158   BILITOT 0.3 05/21/2020 1158   GFRNONAA 87 05/21/2020 1158   GFRAA 100 05/21/2020 1158   Lab Results  Component Value Date   CHOL 210 (H)  05/21/2020   HDL 39 (L) 05/21/2020   LDLCALC 116 (H) 05/21/2020   TRIG 316 (H) 05/21/2020   CHOLHDL 5.4 (H) 05/21/2020   Lab Results  Component Value Date   HGBA1C 4.4 12/13/2018      ASSESSMENT AND PLAN 69 y.o. year old male  has a past medical history of Allergy, Colonic polyp, Diverticulosis, Dyslipidemia, Hemorrhoids, Hepatitis C, Hypertension, Pneumonia, Sleep apnea, Sleep apnea, and Smoker. here with :  1. Obstructive sleep apnea on CPAP   Good compliance  Good treatment of apnea  Encourage patient to use CPAP nightly and greater than 4 hours each night  Follow-up in 1 year or sooner if needed    I spent 20 minutes of face-to-face and non-face-to-face time with patient.  This included previsit chart review, lab review, study review, order entry, electronic health record documentation, patient education.    Ward Givens, MSN, NP-C 07/31/2020, 10:33 AM [facility] [address] [place], [place] [address] [phone]

## 2020-08-23 DIAGNOSIS — G4733 Obstructive sleep apnea (adult) (pediatric): Secondary | ICD-10-CM | POA: Diagnosis not present

## 2020-09-03 ENCOUNTER — Other Ambulatory Visit: Payer: Self-pay

## 2020-09-03 ENCOUNTER — Encounter: Payer: Self-pay | Admitting: Family Medicine

## 2020-09-03 ENCOUNTER — Ambulatory Visit (INDEPENDENT_AMBULATORY_CARE_PROVIDER_SITE_OTHER): Payer: Medicare HMO | Admitting: Family Medicine

## 2020-09-03 VITALS — BP 142/96 | HR 66 | Temp 98.0°F | Wt 206.2 lb

## 2020-09-03 DIAGNOSIS — M25561 Pain in right knee: Secondary | ICD-10-CM | POA: Diagnosis not present

## 2020-09-03 DIAGNOSIS — M1990 Unspecified osteoarthritis, unspecified site: Secondary | ICD-10-CM | POA: Diagnosis not present

## 2020-09-03 MED ORDER — TRIAMCINOLONE ACETONIDE 40 MG/ML IJ SUSP
40.0000 mg | Freq: Once | INTRAMUSCULAR | Status: AC
  Administered 2020-09-03: 40 mg via INTRAMUSCULAR

## 2020-09-03 MED ORDER — LIDOCAINE HCL 1 % IJ SOLN
10.0000 mL | Freq: Once | INTRAMUSCULAR | Status: AC
  Administered 2020-09-03: 10 mL via INTRADERMAL

## 2020-09-03 NOTE — Progress Notes (Signed)
   Subjective:    Patient ID: Andrew Yang, male    DOB: 1950/11/09, 70 y.o.   MRN: [id number]  HPI He is here for consult concerning continued right knee pain.  He had an injection in November of this year which he states did help.  This was done by his orthopedic surgeon.  He states that he does have x-rays that do show medial compartment damage.  He has a ski trip planned to [place] and would like another injection to help ensure that he does not have difficulty with the ski trip.   Review of Systems     Objective:   Physical Exam Alert and in no distress.  Exam of the knee shows no tenderness to palpation or effusion.       Assessment & Plan:  Arthritis The right knee was prepped laterally with Betadine.  The joint line was identified.  40 mg of Kenalog and 3 cc of Xylocaine was injected into the joint space without difficulty.  He tolerated the procedure well. Discussed follow-up concerning further injections and possibility of doing a partial knee replacement based on his history of having bone-on-bone medially.  He will discuss it further with his orthopedic surgeon.

## 2020-09-03 NOTE — Addendum Note (Signed)
Addended by: Elyse Jarvis on: 09/03/2020 03:29 PM   Modules accepted: Orders

## 2020-09-14 ENCOUNTER — Encounter: Payer: Self-pay | Admitting: Family Medicine

## 2020-09-16 MED ORDER — HYDROXYZINE HCL 10 MG PO TABS: 10.0000 mg | ORAL_TABLET | Freq: Three times a day (TID) | ORAL | 0 refills | Status: DC | PRN

## 2020-09-16 NOTE — Telephone Encounter (Signed)
He has a history consistent with hot tub folliculitis.  In the past he used Cipro and hydroxyzine.  I recommended that we not use Cipro as the folliculitis usually runs its course and goes away.  Will call in the Atarax.  He was comfortable with that.

## 2020-09-21 ENCOUNTER — Telehealth: Payer: Self-pay

## 2020-09-21 NOTE — Telephone Encounter (Signed)
P.A. HYDROXYZINE completed

## 2020-09-23 DIAGNOSIS — G4733 Obstructive sleep apnea (adult) (pediatric): Secondary | ICD-10-CM | POA: Diagnosis not present

## 2020-09-27 NOTE — Telephone Encounter (Signed)
P.A. approved til 08/09/21, left message for pt

## 2020-10-21 DIAGNOSIS — G4733 Obstructive sleep apnea (adult) (pediatric): Secondary | ICD-10-CM | POA: Diagnosis not present

## 2020-11-21 DIAGNOSIS — L570 Actinic keratosis: Secondary | ICD-10-CM | POA: Diagnosis not present

## 2020-11-21 DIAGNOSIS — L821 Other seborrheic keratosis: Secondary | ICD-10-CM | POA: Diagnosis not present

## 2020-11-21 DIAGNOSIS — D1801 Hemangioma of skin and subcutaneous tissue: Secondary | ICD-10-CM | POA: Diagnosis not present

## 2020-11-21 DIAGNOSIS — L820 Inflamed seborrheic keratosis: Secondary | ICD-10-CM | POA: Diagnosis not present

## 2020-11-21 DIAGNOSIS — D225 Melanocytic nevi of trunk: Secondary | ICD-10-CM | POA: Diagnosis not present

## 2020-11-21 DIAGNOSIS — Z85828 Personal history of other malignant neoplasm of skin: Secondary | ICD-10-CM | POA: Diagnosis not present

## 2020-12-03 ENCOUNTER — Other Ambulatory Visit: Payer: Self-pay | Admitting: Family Medicine

## 2020-12-03 DIAGNOSIS — M549 Dorsalgia, unspecified: Secondary | ICD-10-CM

## 2020-12-03 DIAGNOSIS — G8929 Other chronic pain: Secondary | ICD-10-CM

## 2020-12-03 NOTE — Telephone Encounter (Signed)
[facility] is requesting to fill pt celebrex. Please advise KH 

## 2020-12-10 ENCOUNTER — Encounter: Payer: Self-pay | Admitting: Family Medicine

## 2020-12-10 ENCOUNTER — Other Ambulatory Visit: Payer: Self-pay

## 2020-12-10 ENCOUNTER — Ambulatory Visit (INDEPENDENT_AMBULATORY_CARE_PROVIDER_SITE_OTHER): Payer: Medicare HMO | Admitting: Family Medicine

## 2020-12-10 VITALS — BP 148/98 | HR 73 | Temp 97.1°F | Wt 206.0 lb

## 2020-12-10 DIAGNOSIS — M1711 Unilateral primary osteoarthritis, right knee: Secondary | ICD-10-CM

## 2020-12-10 NOTE — Progress Notes (Signed)
   Subjective:    Patient ID: Andrew Yang, male    DOB: 01-Jun-1951, 70 y.o.   MRN: [id number]  HPI He is here for consult concerning his arthritis and how to take care of it.  He has a trip planned to [place] to play 4 rounds of golf.  He is concerned about the pain.  Normally he takes 1 Celebrex per day which helps with generalized aches and pains.  His last injection was approximately 3 months ago.  Since then he says he has occasional difficulty with some medial knee discomfort.  He apparently does not take any medications for that.   Review of Systems     Objective:   Physical Exam Alert and in no distress.  Right knee exam does show slight effusion.  Anterior drawer negative.  Medial and lateral collateral ligaments intact.  McMurray's testing negative.       Assessment & Plan:  Arthritis of right knee I explained that I thought it be more appropriate to use 2 Tylenol 4 times per day as needed for pain and then add Celebrex 1 pill twice per day to help with it all based on symptoms.  Its only been 3 months and I would be reluctant to give him a shot and deal with possible side effects from the medication.  He was comfortable with that.  At the end of the encounter he then mentioned having difficulty with hot tub folliculitis and did respond well to Cipro.  He plans another trip out [place] during the ski season and would like Cipro as well as Vistaril which help with the itching.  Apparently only 2 doses of Cipro took care of his hot tub folliculitis.

## 2020-12-12 ENCOUNTER — Telehealth: Payer: Self-pay | Admitting: Family Medicine

## 2020-12-12 NOTE — Telephone Encounter (Signed)
Pt called and has tested positive for COVID and wanted to inform you of since you and him was in the office room together,   Pt is doing a virtual friday 12/14/19

## 2020-12-13 ENCOUNTER — Other Ambulatory Visit: Payer: Self-pay

## 2020-12-13 ENCOUNTER — Telehealth: Payer: Self-pay | Admitting: Family Medicine

## 2020-12-13 ENCOUNTER — Encounter: Payer: Self-pay | Admitting: Family Medicine

## 2020-12-13 ENCOUNTER — Telehealth (INDEPENDENT_AMBULATORY_CARE_PROVIDER_SITE_OTHER): Payer: Medicare HMO | Admitting: Family Medicine

## 2020-12-13 VITALS — Ht 72.75 in | Wt 206.0 lb

## 2020-12-13 DIAGNOSIS — U071 COVID-19: Secondary | ICD-10-CM | POA: Diagnosis not present

## 2020-12-13 MED ORDER — PAXLOVID 20 X 150 MG & 10 X 100MG PO TBPK: 1.0000 | ORAL_TABLET | Freq: Two times a day (BID) | ORAL | 0 refills | Status: DC

## 2020-12-13 MED ORDER — PAXLOVID 20 X 150 MG & 10 X 100MG PO TBPK: 3.0000 | ORAL_TABLET | Freq: Two times a day (BID) | ORAL | 0 refills | Status: DC

## 2020-12-13 NOTE — Telephone Encounter (Signed)
Pt called and said the  pharmacy  said he had the wrong dosage sent in for the Paxlovid and they will not fill it until its fixed

## 2020-12-13 NOTE — Progress Notes (Signed)
   Subjective:    Patient ID: Andrew Yang, male    DOB: 12-Jan-1951, 70 y.o.   MRN: [id number]  HPI Documentation for virtual audio and video telecommunications through [place] encounter: The patient was located at home. 2 patient identifiers used.  The provider was located in the office. The patient did consent to this visit and is aware of possible charges through their insurance for this visit. The other persons participating in this telemedicine service were none. Time spent on call was 5 minutes and in review of previous records >20 minutes total for counseling and coordination of care. This virtual service is not related to other E/M service within previous 7 days. He states that his wife had a positive COVID test and recommended that he get 1.  He got 1 yesterday and was positive.  His symptoms are no worse than he normally has with nasal congestion, slight cough and no malaise.  He states that that would not have kept him from going to play golf as they were not much more than what he normally gets. Review of Systems     Objective:   Physical Exam Alert and in no distress with a slightly raspy voice       Assessment & Plan:  COVID  Paxlovid called in.  Also discussed that if he gets worsening fever, coughing, shortness of breath, he is to call.  Otherwise treat symptomatically.

## 2021-05-07 DIAGNOSIS — G4733 Obstructive sleep apnea (adult) (pediatric): Secondary | ICD-10-CM | POA: Diagnosis not present

## 2021-05-15 DIAGNOSIS — H52203 Unspecified astigmatism, bilateral: Secondary | ICD-10-CM | POA: Diagnosis not present

## 2021-05-15 DIAGNOSIS — H5213 Myopia, bilateral: Secondary | ICD-10-CM | POA: Diagnosis not present

## 2021-05-15 DIAGNOSIS — Z961 Presence of intraocular lens: Secondary | ICD-10-CM | POA: Diagnosis not present

## 2021-05-15 DIAGNOSIS — H524 Presbyopia: Secondary | ICD-10-CM | POA: Diagnosis not present

## 2021-05-15 DIAGNOSIS — H40013 Open angle with borderline findings, low risk, bilateral: Secondary | ICD-10-CM | POA: Diagnosis not present

## 2021-05-20 ENCOUNTER — Telehealth: Payer: Self-pay

## 2021-05-20 ENCOUNTER — Other Ambulatory Visit: Payer: Self-pay

## 2021-05-20 ENCOUNTER — Other Ambulatory Visit (INDEPENDENT_AMBULATORY_CARE_PROVIDER_SITE_OTHER): Payer: Medicare HMO

## 2021-05-20 DIAGNOSIS — I10 Essential (primary) hypertension: Secondary | ICD-10-CM

## 2021-05-20 DIAGNOSIS — N529 Male erectile dysfunction, unspecified: Secondary | ICD-10-CM | POA: Diagnosis not present

## 2021-05-20 DIAGNOSIS — R319 Hematuria, unspecified: Secondary | ICD-10-CM

## 2021-05-20 DIAGNOSIS — R7302 Impaired glucose tolerance (oral): Secondary | ICD-10-CM

## 2021-05-20 DIAGNOSIS — Z23 Encounter for immunization: Secondary | ICD-10-CM

## 2021-05-20 DIAGNOSIS — E785 Hyperlipidemia, unspecified: Secondary | ICD-10-CM

## 2021-05-20 DIAGNOSIS — R768 Other specified abnormal immunological findings in serum: Secondary | ICD-10-CM

## 2021-05-20 LAB — POCT URINALYSIS DIP (PROADVANTAGE DEVICE)
Bilirubin, UA: NEGATIVE
Glucose, UA: NEGATIVE mg/dL
Ketones, POC UA: NEGATIVE mg/dL
Leukocytes, UA: NEGATIVE
Nitrite, UA: NEGATIVE
Protein Ur, POC: NEGATIVE mg/dL
Specific Gravity, Urine: 1.015
Urobilinogen, Ur: 0.2
pH, UA: 6.5 (ref 5.0–8.0)

## 2021-05-20 NOTE — Telephone Encounter (Signed)
Noted on appt deck [facility]

## 2021-05-20 NOTE — Telephone Encounter (Signed)
Pt U/A shows blood and please take a look microscopically. U/A is spinning down. Thanks [employer]

## 2021-05-21 LAB — CBC WITH DIFFERENTIAL/PLATELET
Basophils Absolute: 0.1 10*3/uL (ref 0.0–0.2)
Basos: 1 %
EOS (ABSOLUTE): 0.1 10*3/uL (ref 0.0–0.4)
Eos: 3 %
Hematocrit: 43.8 % (ref 37.5–51.0)
Hemoglobin: 14.7 g/dL (ref 13.0–17.7)
Immature Grans (Abs): 0 10*3/uL (ref 0.0–0.1)
Immature Granulocytes: 0 %
Lymphocytes Absolute: 1.6 10*3/uL (ref 0.7–3.1)
Lymphs: 32 %
MCH: 30.6 pg (ref 26.6–33.0)
MCHC: 33.6 g/dL (ref 31.5–35.7)
MCV: 91 fL (ref 79–97)
Monocytes Absolute: 0.5 10*3/uL (ref 0.1–0.9)
Monocytes: 10 %
Neutrophils Absolute: 2.8 10*3/uL (ref 1.4–7.0)
Neutrophils: 54 %
Platelets: 207 10*3/uL (ref 150–450)
RBC: 4.81 x10E6/uL (ref 4.14–5.80)
RDW: 12.5 % (ref 11.6–15.4)
WBC: 5.1 10*3/uL (ref 3.4–10.8)

## 2021-05-21 LAB — COMPREHENSIVE METABOLIC PANEL
ALT: 25 IU/L (ref 0–44)
AST: 25 IU/L (ref 0–40)
Albumin/Globulin Ratio: 2 (ref 1.2–2.2)
Albumin: 4.3 g/dL (ref 3.8–4.8)
Alkaline Phosphatase: 72 IU/L (ref 44–121)
BUN/Creatinine Ratio: 16 (ref 10–24)
BUN: 13 mg/dL (ref 8–27)
Bilirubin Total: 0.3 mg/dL (ref 0.0–1.2)
CO2: 23 mmol/L (ref 20–29)
Calcium: 9.4 mg/dL (ref 8.6–10.2)
Chloride: 103 mmol/L (ref 96–106)
Creatinine, Ser: 0.82 mg/dL (ref 0.76–1.27)
Globulin, Total: 2.2 g/dL (ref 1.5–4.5)
Glucose: 108 mg/dL — ABNORMAL HIGH (ref 70–99)
Potassium: 4.4 mmol/L (ref 3.5–5.2)
Sodium: 141 mmol/L (ref 134–144)
Total Protein: 6.5 g/dL (ref 6.0–8.5)
eGFR: 94 mL/min/{1.73_m2} (ref >59)

## 2021-05-21 LAB — HCV RNA QUANT: Hepatitis C Quantitation: NOT DETECTED IU/mL

## 2021-05-21 LAB — PSA: Prostate Specific Ag, Serum: 0.9 ng/mL (ref 0.0–4.0)

## 2021-05-21 LAB — LIPID PANEL
Chol/HDL Ratio: 3.8 ratio (ref 0.0–5.0)
Cholesterol, Total: 162 mg/dL (ref 100–199)
HDL: 43 mg/dL (ref >39)
LDL Chol Calc (NIH): 93 mg/dL (ref 0–99)
Triglycerides: 147 mg/dL (ref 0–149)
VLDL Cholesterol Cal: 26 mg/dL (ref 5–40)

## 2021-05-26 ENCOUNTER — Other Ambulatory Visit: Payer: Self-pay | Admitting: Family Medicine

## 2021-05-26 DIAGNOSIS — G8929 Other chronic pain: Secondary | ICD-10-CM

## 2021-05-28 ENCOUNTER — Other Ambulatory Visit: Payer: Self-pay

## 2021-05-28 ENCOUNTER — Encounter: Payer: Self-pay | Admitting: Family Medicine

## 2021-05-28 ENCOUNTER — Ambulatory Visit (INDEPENDENT_AMBULATORY_CARE_PROVIDER_SITE_OTHER): Payer: Medicare HMO | Admitting: Family Medicine

## 2021-05-28 VITALS — BP 138/88 | HR 69 | Temp 97.8°F | Ht 72.0 in | Wt 212.0 lb

## 2021-05-28 DIAGNOSIS — M1711 Unilateral primary osteoarthritis, right knee: Secondary | ICD-10-CM

## 2021-05-28 DIAGNOSIS — Z0000 Encounter for general adult medical examination without abnormal findings: Secondary | ICD-10-CM

## 2021-05-28 DIAGNOSIS — Z8659 Personal history of other mental and behavioral disorders: Secondary | ICD-10-CM

## 2021-05-28 DIAGNOSIS — I7090 Unspecified atherosclerosis: Secondary | ICD-10-CM

## 2021-05-28 DIAGNOSIS — R7302 Impaired glucose tolerance (oral): Secondary | ICD-10-CM | POA: Diagnosis not present

## 2021-05-28 DIAGNOSIS — R768 Other specified abnormal immunological findings in serum: Secondary | ICD-10-CM | POA: Diagnosis not present

## 2021-05-28 DIAGNOSIS — M549 Dorsalgia, unspecified: Secondary | ICD-10-CM

## 2021-05-28 DIAGNOSIS — G4710 Hypersomnia, unspecified: Secondary | ICD-10-CM

## 2021-05-28 DIAGNOSIS — Z9989 Dependence on other enabling machines and devices: Secondary | ICD-10-CM

## 2021-05-28 DIAGNOSIS — E785 Hyperlipidemia, unspecified: Secondary | ICD-10-CM | POA: Diagnosis not present

## 2021-05-28 DIAGNOSIS — G4730 Sleep apnea, unspecified: Secondary | ICD-10-CM

## 2021-05-28 DIAGNOSIS — Z86010 Personal history of colonic polyps: Secondary | ICD-10-CM

## 2021-05-28 DIAGNOSIS — R69 Illness, unspecified: Secondary | ICD-10-CM | POA: Diagnosis not present

## 2021-05-28 DIAGNOSIS — J309 Allergic rhinitis, unspecified: Secondary | ICD-10-CM

## 2021-05-28 DIAGNOSIS — N529 Male erectile dysfunction, unspecified: Secondary | ICD-10-CM

## 2021-05-28 DIAGNOSIS — G4733 Obstructive sleep apnea (adult) (pediatric): Secondary | ICD-10-CM | POA: Diagnosis not present

## 2021-05-28 DIAGNOSIS — I700 Atherosclerosis of aorta: Secondary | ICD-10-CM

## 2021-05-28 DIAGNOSIS — F341 Dysthymic disorder: Secondary | ICD-10-CM

## 2021-05-28 DIAGNOSIS — G8929 Other chronic pain: Secondary | ICD-10-CM

## 2021-05-28 DIAGNOSIS — I10 Essential (primary) hypertension: Secondary | ICD-10-CM

## 2021-05-28 MED ORDER — LOSARTAN POTASSIUM-HCTZ 100-12.5 MG PO TABS: 1.0000 | ORAL_TABLET | Freq: Every day | ORAL | 3 refills | Status: DC

## 2021-05-28 MED ORDER — ROSUVASTATIN CALCIUM 40 MG PO TABS: 40.0000 mg | ORAL_TABLET | Freq: Every day | ORAL | 3 refills | Status: DC

## 2021-05-28 MED ORDER — TADALAFIL 20 MG PO TABS: 20.0000 mg | ORAL_TABLET | Freq: Every day | ORAL | 11 refills | Status: DC | PRN

## 2021-05-28 MED ORDER — VENLAFAXINE HCL 75 MG PO TABS: 75.0000 mg | ORAL_TABLET | Freq: Two times a day (BID) | ORAL | 3 refills | Status: DC

## 2021-05-28 MED ORDER — CELECOXIB 200 MG PO CAPS: 200.0000 mg | ORAL_CAPSULE | Freq: Two times a day (BID) | ORAL | 3 refills | Status: DC

## 2021-05-28 NOTE — Progress Notes (Signed)
Andrew Yang is a 70 y.o. male who presents for annual wellness visit,CPE and follow-up on chronic medical conditions.  He does have hyperlipidemia and aortic atherosclerosis and continues on Crestor without difficulty.  He has a previous history of hepatitis C but the tests have all been negative.  His allergies seem to be under good control.  He does have a history of glucose intolerance.  He does take Celebrex regularly for his arthritis especially his right knee.  In the past he has had injections.  He is looking forward to getting an injection next winter so he can go skiing.  He does have hypertension and continues on Hyzaar without difficulty.  He does use Cialis on an as-needed basis for his ED.  Presently he is no longer having any back pain.  He has been seen by neurology for his OSA and is doing well with that.  He continues on Effexor for his dysthymia and has no plans on stopping that.  He does have a history of colonic polyps and in the past had not been told to get a colonoscopy however he has been not compliant with making the appointment.   Immunizations and Health Maintenance Immunization History  Administered Date(s) Administered   Fluad Quad(high Dose 65+) 04/18/2019, 05/09/2020, 05/20/2021   Hepatitis A 10/23/1999, 05/05/2000   Hepatitis B 10/13/1999, 12/24/1999, 05/05/2000   IPV 04/26/2008   Influenza Whole 07/25/2007, 04/09/2011   Influenza, High Dose Seasonal PF 07/21/2016, 06/04/2017, 04/25/2018   Influenza, Seasonal, Injecte, Preservative Fre 07/26/2012   Influenza,inj,Quad PF,6+ Mos 04/17/2014, 06/13/2015   MMR 07/15/2007   Meningococcal Conjugate 04/26/2008   PFIZER(Purple Top)SARS-COV-2 Vaccination 09/16/2019, 10/11/2019, 05/09/2020   Pfizer Covid-19 Vaccine Bivalent Booster 1yrs & up 05/20/2021   Pneumococcal Conjugate-13 10/26/2016   Pneumococcal Polysaccharide-23 11/15/2017   Td 01/05/1984, 05/16/2004, 04/26/2008   Tdap 04/26/2008, 07/26/2012, 12/17/2019    Typhoid Inactivated 04/26/2008   Yellow Fever 04/26/2008   Zoster Recombinat (Shingrix) 10/27/2017, 05/23/2019, 05/23/2019   Zoster, Live 07/26/2012   Health Maintenance Due  Topic Date Due   COVID-19 Vaccine (4 - Booster for [place] series) 08/01/2020   COLONOSCOPY (Pts 45-44yrs Insurance coverage will need to be confirmed)  04/19/2021    Last colonoscopy: 04/20/11  Last PSA: 05/20/21 level 0.9 Dentist: Q six months  Ophtho: Q year  Exercise: stationary bike at home and Gym 6 days a week    Other doctors caring for patient include: Dr. Gershon Crane [facility], Dr. Ronnald Ramp dermatology, Clabe Seal NP neurology  Advanced Directives: Does Patient Have a Medical Advance Directive?: Yes Type of Advance Directive: Living will, Healthcare Power of Attorney Does patient want to make changes to medical advance directive?: No - Patient declined Copy of [place] in Chart?: Yes - validated most recent copy scanned in chart (See row information)  Depression screen:  See questionnaire below.     Depression screen [facility] 2/9 05/28/2021 12/10/2020 09/03/2020 05/27/2020 12/13/2018  Decreased Interest 0 0 0 0 0  Down, Depressed, Hopeless 0 0 0 0 0  PHQ - 2 Score 0 0 0 0 0  Altered sleeping - 0 - - -  Tired, decreased energy - 0 - - -  Change in appetite - 0 - - -  Feeling bad or failure about yourself  - 0 - - -  Trouble concentrating - 0 - - -  Moving slowly or fidgety/restless - 0 - - -  Suicidal thoughts - 0 - - -  PHQ-9 Score - 0 - - -  Difficult doing work/chores - Not difficult at all - - -    Fall Screen: See Questionaire below.   Fall Risk  05/28/2021 05/27/2020 11/15/2017 07/29/2017 10/26/2016  Falls in the past year? 0 0 No No No  Number falls in past yr: 0 - - - -  Injury with Fall? 0 - - - -  Risk for fall due to : No Fall Risks - - - -  Follow up Falls evaluation completed - - - -    ADL screen:  See questionnaire below.  Functional Status Survey: Is the patient deaf or have  difficulty hearing?: No Does the patient have difficulty seeing, even when wearing glasses/contacts?: No Does the patient have difficulty concentrating, remembering, or making decisions?: No Does the patient have difficulty walking or climbing stairs?: No Does the patient have difficulty dressing or bathing?: No Does the patient have difficulty doing errands alone such as visiting a doctor's office or shopping?: No   Review of Systems  Constitutional: -, -unexpected weight change, -anorexia, -fatigue Allergy: -sneezing, -itching, -congestion Dermatology: denies changing moles, rash, lumps ENT: -runny nose, -ear pain, -sore throat,  Cardiology:  -chest pain, -palpitations, -orthopnea, Respiratory: -cough, -shortness of breath, -dyspnea on exertion, -wheezing,  Gastroenterology: -abdominal pain, -nausea, -vomiting, -diarrhea, -constipation, -dysphagia Hematology: -bleeding or bruising problems Musculoskeletal: -arthralgias, -myalgias, -joint swelling, -back pain, - Ophthalmology: -vision changes,  Urology: -dysuria, -difficulty urinating,  -urinary frequency, -urgency, incontinence Neurology: -, -numbness, , -memory loss, -falls, -dizziness    PHYSICAL EXAM:  General Appearance: Alert, cooperative, no distress, appears stated age Head: Normocephalic, without obvious abnormality, atraumatic Eyes: PERRL, conjunctiva/corneas clear, EOM's intact,  Ears: Normal TM's and external ear canals Nose: Nares normal, mucosa normal, no drainage or sinus   tenderness Throat: Lips, mucosa, and tongue normal; teeth and gums normal Neck: Supple, no lymphadenopathy, thyroid:no enlargement/tenderness/nodules; no carotid bruit or JVD Lungs: Clear to auscultation bilaterally without wheezes, rales or ronchi; respirations unlabored Heart: Regular rate and rhythm, S1 and S2 normal, no murmur, rub or gallop Abdomen: Soft, non-tender, nondistended, normoactive bowel sounds, no masses, no  hepatosplenomegaly Skin: Skin color, texture, turgor normal, no rashes or lesions Lymph nodes: Cervical, supraclavicular, and axillary nodes normal Neurologic: CNII-XII intact, normal strength, sensation and gait; reflexes 2+ and symmetric throughout   Psych: Normal mood, affect, hygiene and grooming Hemoglobin A1c is 5.7 ASSESSMENT/PLAN: Routine general medical examination at a health care facility  Arthritis of right knee  Glucose intolerance (impaired glucose tolerance) - Plan: HgB A1c  Chronic back pain, unspecified back location, unspecified back pain laterality - Plan: celecoxib (CELEBREX) 200 MG capsule  Hyperlipidemia with target low density lipoprotein (LDL) cholesterol less than 100 mg/dL - Plan: rosuvastatin (CRESTOR) 40 MG tablet  Hepatitis C antibody test positive  Erectile dysfunction, unspecified erectile dysfunction type - Plan: tadalafil (CIALIS) 20 MG tablet  Chronic allergic rhinitis  History of colonic polyps - Plan: Ambulatory referral to Gastroenterology  Obstructive sleep apnea treated with continuous positive airway pressure (CPAP)  H/O dysthymia  Hypersomnia with sleep apnea  Essential hypertension - Plan: losartan-hydrochlorothiazide (HYZAAR) 100-12.5 MG tablet  Atherosclerosis - Plan: rosuvastatin (CRESTOR) 40 MG tablet  Dysthymia - Plan: venlafaxine (EFFEXOR) 75 MG tablet  Atherosclerosis of aorta ([place])  Encouraged him to continue make diet and exercise changes to help with his weight loss.  He has lost some weight.  He will call me when he needs an injection so he can go skiing.  He also apparently has had hot tub folliculitis  and will probably want a refill on Cipro.  He does recognize the problems with that.  He also recognizes taking Celebrex regularly and the risks of fluid retention and renal issues.  Immunization recommendations discussed.  Colonoscopy recommendations reviewed.   Medicare Attestation I have personally reviewed: The  patient's medical and social history Their use of alcohol, tobacco or illicit drugs Their current medications and supplements The patient's functional ability including ADLs,fall risks, home safety risks, cognitive, and hearing and visual impairment Diet and physical activities Evidence for depression or mood disorders  The patient's weight, height, and BMI have been recorded in the chart.  I have made referrals, counseling, and provided education to the patient based on review of the above and I have provided the patient with a written personalized care plan for preventive services.     Jill Alexanders, MD   05/28/2021

## 2021-05-28 NOTE — Patient Instructions (Signed)
  Andrew Yang , Thank you for taking time to come for your Medicare Wellness Visit. I appreciate your ongoing commitment to your health goals. Please review the following plan we discussed and let me know if I can assist you in the future.   These are the goals we discussed: Continue on present meds   This is a list of the screening recommended for you and due dates:  Health Maintenance  Topic Date Due   COVID-19 Vaccine (4 - Booster for Pfizer series) 08/01/2020   Colon Cancer Screening  04/19/2021   Tetanus Vaccine  12/16/2029   Pneumonia Vaccine  Completed   Flu Shot  Completed   Hepatitis C Screening: USPSTF Recommendation to screen - Ages 18-79 yo.  Completed   Zoster (Shingles) Vaccine  Completed   HPV Vaccine  Aged Out

## 2021-05-29 LAB — POCT GLYCOSYLATED HEMOGLOBIN (HGB A1C): Hemoglobin A1C: 5.7 % — AB (ref 4.0–5.6)

## 2021-05-29 LAB — POCT URINALYSIS DIP (PROADVANTAGE DEVICE)
Bilirubin, UA: NEGATIVE
Blood, UA: NEGATIVE
Glucose, UA: NEGATIVE mg/dL
Ketones, POC UA: NEGATIVE mg/dL
Leukocytes, UA: NEGATIVE
Nitrite, UA: NEGATIVE
Protein Ur, POC: NEGATIVE mg/dL
Specific Gravity, Urine: 1.02
Urobilinogen, Ur: 0.2
pH, UA: 6.5 (ref 5.0–8.0)

## 2021-06-04 DIAGNOSIS — Z85828 Personal history of other malignant neoplasm of skin: Secondary | ICD-10-CM | POA: Diagnosis not present

## 2021-06-04 DIAGNOSIS — L570 Actinic keratosis: Secondary | ICD-10-CM | POA: Diagnosis not present

## 2021-06-04 DIAGNOSIS — L820 Inflamed seborrheic keratosis: Secondary | ICD-10-CM | POA: Diagnosis not present

## 2021-06-06 DIAGNOSIS — G4733 Obstructive sleep apnea (adult) (pediatric): Secondary | ICD-10-CM | POA: Diagnosis not present

## 2021-06-09 ENCOUNTER — Other Ambulatory Visit: Payer: Self-pay

## 2021-06-09 ENCOUNTER — Ambulatory Visit (INDEPENDENT_AMBULATORY_CARE_PROVIDER_SITE_OTHER): Payer: Medicare HMO | Admitting: Family Medicine

## 2021-06-09 VITALS — BP 148/90 | HR 64 | Temp 97.9°F | Wt 213.4 lb

## 2021-06-09 DIAGNOSIS — M1711 Unilateral primary osteoarthritis, right knee: Secondary | ICD-10-CM

## 2021-06-09 NOTE — Progress Notes (Signed)
   Subjective:    Patient ID: Andrew Yang, male    DOB: [birth year], 70 y.o.   MRN: [id number]  HPI He is here for an injection.  He has DJD and has seen orthopedics.  They have recommended surgery but he is not ready for this.  He has had difficulty recently with his knee and would like an injection.  He has had this in the past.  He also plans to do it again in the winter when he goes skiing.  He is aware of the dangers of intra-articular steroid use.   Review of Systems     Objective:   Physical Exam Alert and in no distress otherwise not examined       Assessment & Plan:  Arthritis of right knee The right knee was prepped laterally.  The joint line was identified.  40 mg of triamcinolone and 3 cc of Xylocaine was injected easily into the joint space.  He tolerated the procedure well.

## 2021-06-10 MED ORDER — LIDOCAINE HCL 2 % IJ SOLN
2.0000 mL | Freq: Once | INTRAMUSCULAR | Status: AC
  Administered 2021-06-09: 40 mg via INTRADERMAL

## 2021-06-10 MED ORDER — TRIAMCINOLONE ACETONIDE 40 MG/ML IJ SUSP
40.0000 mg | Freq: Once | INTRAMUSCULAR | Status: AC
  Administered 2021-06-09: 40 mg via INTRAMUSCULAR

## 2021-06-16 DIAGNOSIS — M25511 Pain in right shoulder: Secondary | ICD-10-CM | POA: Diagnosis not present

## 2021-06-16 DIAGNOSIS — M19011 Primary osteoarthritis, right shoulder: Secondary | ICD-10-CM | POA: Diagnosis not present

## 2021-06-23 DIAGNOSIS — H40013 Open angle with borderline findings, low risk, bilateral: Secondary | ICD-10-CM | POA: Diagnosis not present

## 2021-07-07 DIAGNOSIS — G4733 Obstructive sleep apnea (adult) (pediatric): Secondary | ICD-10-CM | POA: Diagnosis not present

## 2021-07-23 ENCOUNTER — Other Ambulatory Visit: Payer: Self-pay | Admitting: Family Medicine

## 2021-07-23 DIAGNOSIS — I10 Essential (primary) hypertension: Secondary | ICD-10-CM

## 2021-07-29 ENCOUNTER — Encounter: Payer: Self-pay | Admitting: *Deleted

## 2021-07-30 ENCOUNTER — Telehealth: Payer: Self-pay | Admitting: Adult Health

## 2021-07-30 ENCOUNTER — Telehealth: Payer: Medicare HMO | Admitting: Adult Health

## 2021-07-30 NOTE — Telephone Encounter (Signed)
Contacted pt with reminder for MyChart video visit today. Pt stated, computer server has crashed, cannot get on. Can she call me or facetime? Inform pt cannot facetime the visit. Pt stated, she can call me or call me back to reschedule.  Sent message to NP pt unable to get on MyChart.

## 2021-07-30 NOTE — Telephone Encounter (Signed)
Spoke with patient and offered a cancellation for tomorrow at 10 AM. He prefers a later appointment. He requested to leave the January appt as scheduled. He verbalized appreciation for the call and his questions were answered.

## 2021-07-30 NOTE — Telephone Encounter (Signed)
Pt rescheduled MyChart appt for 09/09/21 at 1:45p. Pt said, questionnaire will not allow him to type in answers.

## 2021-08-27 ENCOUNTER — Telehealth: Payer: Self-pay | Admitting: Family Medicine

## 2021-08-27 ENCOUNTER — Encounter: Payer: Self-pay | Admitting: Family Medicine

## 2021-08-27 NOTE — Telephone Encounter (Signed)
Pt called and is wanting to come in for a knee injection states he needs to come in tomorrow he is leaving Sunday to go skiing and informed him that you do not have anything and he wants to know if we can fit him in

## 2021-08-28 ENCOUNTER — Other Ambulatory Visit: Payer: Self-pay

## 2021-08-28 ENCOUNTER — Ambulatory Visit (INDEPENDENT_AMBULATORY_CARE_PROVIDER_SITE_OTHER): Payer: Medicare HMO | Admitting: Family Medicine

## 2021-08-28 VITALS — BP 120/78 | HR 75 | Temp 98.3°F | Wt 213.0 lb

## 2021-08-28 DIAGNOSIS — M1711 Unilateral primary osteoarthritis, right knee: Secondary | ICD-10-CM | POA: Diagnosis not present

## 2021-08-28 MED ORDER — CIPROFLOXACIN HCL 500 MG PO TABS: 500.0000 mg | ORAL_TABLET | Freq: Two times a day (BID) | ORAL | 0 refills | Status: AC

## 2021-08-28 MED ORDER — TRIAMCINOLONE ACETONIDE 40 MG/ML IJ SUSP
40.0000 mg | Freq: Once | INTRAMUSCULAR | Status: AC
  Administered 2021-08-28: 40 mg via INTRAMUSCULAR

## 2021-08-28 MED ORDER — LIDOCAINE HCL 1 % IJ SOLN
10.0000 mL | Freq: Once | INTRAMUSCULAR | Status: AC
  Administered 2021-08-28 (×2): 10 mL via INTRADERMAL

## 2021-08-28 NOTE — Addendum Note (Signed)
Addended by: Elyse Jarvis on: 08/28/2021 12:46 PM   Modules accepted: Orders

## 2021-08-28 NOTE — Progress Notes (Signed)
° °  Subjective:    Patient ID: Andrew Yang, male    DOB: 08-12-1950, 71 y.o.   MRN: [id number]  HPI He is here for right knee injection prior to going skiing.  He does have a history of arthritis and in the past has done well with an injection prior to skiing.  He would also like a refill on his Cipro.  He has had difficulty in the past with hot tub folliculitis.   Review of Systems     Objective:   Physical Exam Alert male in no distress otherwise not examined       Assessment & Plan:  Arthritis of right knee The right lateral joint was identified.  Betadine applied.  40 mg of Kenalog and 3 cc of Xylocaine was injected into the space without difficulty.  He tolerated the procedure well. Writer will call in Cipro.  I did warn him concerning the fact that if he gets hot tub folliculitis it could indicate that the hot tub is not adequately cared for.

## 2021-09-09 ENCOUNTER — Telehealth: Payer: Medicare HMO | Admitting: Adult Health

## 2021-09-09 DIAGNOSIS — Z9989 Dependence on other enabling machines and devices: Secondary | ICD-10-CM

## 2021-09-09 DIAGNOSIS — G4733 Obstructive sleep apnea (adult) (pediatric): Secondary | ICD-10-CM | POA: Diagnosis not present

## 2021-09-09 NOTE — Progress Notes (Signed)
PATIENT: Andrew Yang DOB: 21-Aug-1950  REASON FOR VISIT: follow up HISTORY FROM: patient PRIMARY NEUROLOGIST:   Virtual Visit via Video Note  I connected with Lauretta Grill on 09/09/21 at  1:45 PM EST by a video enabled telemedicine application located remotely at [facility] and verified that I am speaking with the correct person using two identifiers who was located at their own home.   I discussed the limitations of evaluation and management by telemedicine and the availability of in person appointments. The patient expressed understanding and agreed to proceed.   PATIENT: Andrew Yang DOB: 18-May-1951  REASON FOR VISIT: follow up HISTORY FROM: patient  HISTORY OF PRESENT ILLNESS: Today 09/09/21:    Mr. Heffern is a 71 year old male with a history of obstructive sleep apnea on CPAP.  He returns today for follow-up.  He reports that the CPAP is working well.  Denies any new issues.    07/31/20: Mr. Zentner is a 71 year old male with a history of obstructive sleep apnea on CPAP.  His download indicates that he uses machine nightly for compliance of 100%.  He uses machine greater than 4 hours 23 days for compliance of 77%.  On average he uses his machine 5 hours and 42 minutes.  His residual AHI is 0.6 on 9 cm of water with EPR 1.  Leak in the 95th percentile is 24.1 L/min.  Overall he feels that he is doing well.  He returns today for an evaluation.    REVIEW OF SYSTEMS: Out of a complete 14 system review of symptoms, the patient complains only of the following symptoms, and all other reviewed systems are negative.  ALLERGIES: Allergies  Allergen Reactions   Lisinopril Cough    HOME MEDICATIONS: Outpatient Medications Prior to Visit  Medication Sig Dispense Refill   acetaminophen (TYLENOL) 500 MG tablet Take 1,000 mg by mouth as needed.     Apoaequorin (PREVAGEN PO) Take by mouth.     Ascorbic Acid (VITAMIN C) 1000 MG tablet Take 1,000 mg by  mouth daily.     Butalbital-APAP-Caffeine 50-300-40 MG CAPS Take 50 mg by mouth as needed. (Patient not taking: Reported on 05/28/2021) 30 capsule 2   celecoxib (CELEBREX) 200 MG capsule Take 1 capsule (200 mg total) by mouth 2 (two) times daily. 90 capsule 3   cholecalciferol (VITAMIN D3) 25 MCG (1000 UT) tablet Take 1,000 Units by mouth daily.     Clobetasol Propionate 0.05 % shampoo SMARTSIG:1 Topical Twice a Week PRN     Ginkgo Biloba Extract 60 MG CAPS Take 120 mg by mouth daily.     GINSENG PO Take 2 tablets by mouth daily.     Glucosamine-Chondroitin (GLUCOSAMINE CHONDR COMPLEX PO) Take 2 tablets by mouth daily.      hydrOXYzine (ATARAX/VISTARIL) 10 MG tablet Take 1 tablet (10 mg total) by mouth 3 (three) times daily as needed. 30 tablet 0   losartan-hydrochlorothiazide (HYZAAR) 100-12.5 MG tablet TAKE 1 TABLET BY MOUTH EVERY DAY 90 tablet 1   metaxalone (SKELAXIN) 800 MG tablet TAKE 1 TABLET (800 MG TOTAL) THREE TIMES A DAY BY MOUTH AS NEEDED 90 tablet 0   Multiple Vitamin (MULTIVITAMIN WITH MINERALS) TABS tablet Take 1 tablet by mouth daily.     Nirmatrelvir & Ritonavir (PAXLOVID) 20 x 150 MG & 10 x 100MG  TBPK Take 3 tablets by mouth in the morning and at bedtime. One tablet each twice a day (Patient not taking: Reported on 05/28/2021) 30  tablet 0   rosuvastatin (CRESTOR) 40 MG tablet Take 1 tablet (40 mg total) by mouth daily. 90 tablet 3   tadalafil (CIALIS) 20 MG tablet Take 1 tablet (20 mg total) by mouth daily as needed for erectile dysfunction. 10 tablet 11   venlafaxine (EFFEXOR) 75 MG tablet Take 1 tablet (75 mg total) by mouth 2 (two) times daily. 180 tablet 3   No facility-administered medications prior to visit.    PAST MEDICAL HISTORY: Past Medical History:  Diagnosis Date   Allergy    RHINITIS   Colonic polyp    Diverticulosis    Dyslipidemia    Hemorrhoids    Hepatitis C    treated with interferon and Ribiviran  with negative viral loads   Hypertension     Pneumonia    as a child   Sleep apnea    Sleep apnea    CPAP   Smoker     PAST SURGICAL HISTORY: Past Surgical History:  Procedure Laterality Date   ANTERIOR CRUCIATE LIGAMENT REPAIR  2006   EYE SURGERY     cataract 2018  bil ---1/ 2019  lasik right eye 2019  -- 2020 laser left eye removed a film growing over cataract   SKIN BIOPSY Left    Occipital scalp shave &ED&C  basal cell carinoma, nodular pattern   SKIN BIOPSY Right 05/24/2019   squamous cell carcinoma, keratoacanthoma type,features of regression   SKIN BIOPSY Bilateral 05/21/2020   Hypertrophic actinic keratosis   UMBILICAL HERNIA REPAIR N/A 04/26/2019   Procedure: UMBILICAL HERNIA REPAIR WITH MESH;  Surgeon: Coralie Keens, MD;  Location: [facility];  Service: General;  Laterality: N/A;    FAMILY HISTORY: Family History  Problem Relation Age of Onset   Arthritis Mother    Hypertension Father    Heart disease Maternal Grandfather     SOCIAL HISTORY: Social History   Socioeconomic History   Marital status: Married    Spouse name: Not on file   Number of children: Not on file   Years of education: Not on file   Highest education level: Not on file  Occupational History   Not on file  Tobacco Use   Smoking status: Former    Types: Cigarettes    Quit date: 10/26/2016    Years since quitting: 4.8   Smokeless tobacco: Never  Vaping Use   Vaping Use: Never used  Substance and Sexual Activity   Alcohol use: Yes    Alcohol/week: 15.0 standard drinks    Types: 15 Cans of beer per week   Drug use: No   Sexual activity: Yes  Other Topics Concern   Not on file  Social History Narrative   Not on file   Social Determinants of Health   Financial Resource Strain: Not on file  Food Insecurity: Not on file  Transportation Needs: Not on file  Physical Activity: Not on file  Stress: Not on file  Social Connections: Not on file  Intimate Partner Violence: Not on file      PHYSICAL EXAM Generalized: Well  developed, in no acute distress   Neurological examination  Mentation: Alert oriented to time, place, history taking. Follows all commands speech and language fluent Cranial nerve II-XII: Facial symmetry noted.   DIAGNOSTIC DATA (LABS, IMAGING, TESTING) - I reviewed patient records, labs, notes, testing and imaging myself where available.  Lab Results  Component Value Date   WBC 5.1 05/20/2021   HGB 14.7 05/20/2021   HCT 43.8 05/20/2021  MCV 91 05/20/2021   PLT 207 05/20/2021      Component Value Date/Time   NA 141 05/20/2021 0932   K 4.4 05/20/2021 0932   CL 103 05/20/2021 0932   CO2 23 05/20/2021 0932   GLUCOSE 108 (H) 05/20/2021 0932   GLUCOSE 103 (H) 04/19/2019 1104   BUN 13 05/20/2021 0932   CREATININE 0.82 05/20/2021 0932   CREATININE 1.10 10/08/2016 1459   CALCIUM 9.4 05/20/2021 0932   PROT 6.5 05/20/2021 0932   ALBUMIN 4.3 05/20/2021 0932   AST 25 05/20/2021 0932   ALT 25 05/20/2021 0932   ALKPHOS 72 05/20/2021 0932   BILITOT 0.3 05/20/2021 0932   GFRNONAA 87 05/21/2020 1158   GFRAA 100 05/21/2020 1158   Lab Results  Component Value Date   CHOL 162 05/20/2021   HDL 43 05/20/2021   LDLCALC 93 05/20/2021   TRIG 147 05/20/2021   CHOLHDL 3.8 05/20/2021   Lab Results  Component Value Date   HGBA1C 5.7 (A) 05/28/2021   No results found for: VITAMINB12 No results found for: TSH    ASSESSMENT AND PLAN 71 y.o. year old male  has a past medical history of Allergy, Colonic polyp, Diverticulosis, Dyslipidemia, Hemorrhoids, Hepatitis C, Hypertension, Pneumonia, Sleep apnea, Sleep apnea, and Smoker. here with:  OSA on CPAP  CPAP compliance excellent Residual AHI is good Encouraged patient to continue using CPAP nightly and > 4 hours each night F/U in 1 year or sooner if needed   Ward Givens, MSN, NP-C 09/09/2021, 1:57 PM [facility] [address] [place], [place] [address] [phone]

## 2021-09-09 NOTE — Progress Notes (Deleted)
.meg    PATIENT: Andrew Yang DOB: 06-Nov-1950  REASON FOR VISIT: follow up HISTORY FROM: patient  HISTORY OF PRESENT ILLNESS: Today 09/09/21:    Andrew Yang is a 71 year old male with a history of obstructive sleep apnea on CPAP.  His download indicates that he uses machine nightly for compliance of 100%.  He uses machine greater than 4 hours 23 days for compliance of 77%.  On average he uses his machine 5 hours and 42 minutes.  His residual AHI is 0.6 on 9 cm of water with EPR 1.  Leak in the 95th percentile is 24.1 L/min.  Overall he feels that he is doing well.  He returns today for an evaluation.  08/01/19:Andrew Yang is a 71 year old male with a history of obstructive sleep apnea on CPAP.  He returns today for follow-up.  His download indicates that he uses machine 30 out of 30 days for compliance of 100%.  He uses machine greater than 4 hours 29 days for compliance of 97%.  On average he uses his machine 7 hours and 7 minutes.  His residual AHI is 2.1 on 9 cm of water with EPR of 1.  His leak in the 95th percentile is 14.8 L/min.  Reports that the CPAP works well for him.  He denies any new issues.  He returns today for an evaluation.  HISTORY 12/18/2019CM Andrew Yang, 71 year old male returns for follow-up with history of obstructive sleep apnea here for CPAP compliance.  He has been doing well with his CPAP.  Data dated 06/26/2018-07/25/2018 shows compliance greater than 4 hours at 87%.  Usage days 97%.  Average usage 6 hours 31 minutes.  Set pressure of 9 cm leak 95th percentile 10.7.  AHI 1.1.  ESS 5 fatigue severity scale 12.  He returns for reevaluation  REVIEW OF SYSTEMS: Out of a complete 14 system review of symptoms, the patient complains only of the following symptoms, and all other reviewed systems are negative.   ESS 5 FSS 9  ALLERGIES: Allergies  Allergen Reactions   Lisinopril Cough    HOME MEDICATIONS: Outpatient Medications Prior to Visit  Medication Sig  Dispense Refill   acetaminophen (TYLENOL) 500 MG tablet Take 1,000 mg by mouth as needed.     Apoaequorin (PREVAGEN PO) Take by mouth.     Ascorbic Acid (VITAMIN C) 1000 MG tablet Take 1,000 mg by mouth daily.     Butalbital-APAP-Caffeine 50-300-40 MG CAPS Take 50 mg by mouth as needed. (Patient not taking: Reported on 05/28/2021) 30 capsule 2   celecoxib (CELEBREX) 200 MG capsule Take 1 capsule (200 mg total) by mouth 2 (two) times daily. 90 capsule 3   cholecalciferol (VITAMIN D3) 25 MCG (1000 UT) tablet Take 1,000 Units by mouth daily.     Clobetasol Propionate 0.05 % shampoo SMARTSIG:1 Topical Twice a Week PRN     Ginkgo Biloba Extract 60 MG CAPS Take 120 mg by mouth daily.     GINSENG PO Take 2 tablets by mouth daily.     Glucosamine-Chondroitin (GLUCOSAMINE CHONDR COMPLEX PO) Take 2 tablets by mouth daily.      hydrOXYzine (ATARAX/VISTARIL) 10 MG tablet Take 1 tablet (10 mg total) by mouth 3 (three) times daily as needed. 30 tablet 0   losartan-hydrochlorothiazide (HYZAAR) 100-12.5 MG tablet TAKE 1 TABLET BY MOUTH EVERY DAY 90 tablet 1   metaxalone (SKELAXIN) 800 MG tablet TAKE 1 TABLET (800 MG TOTAL) THREE TIMES A DAY BY MOUTH AS NEEDED 90 tablet 0  Multiple Vitamin (MULTIVITAMIN WITH MINERALS) TABS tablet Take 1 tablet by mouth daily.     Nirmatrelvir & Ritonavir (PAXLOVID) 20 x 150 MG & 10 x 100MG  TBPK Take 3 tablets by mouth in the morning and at bedtime. One tablet each twice a day (Patient not taking: Reported on 05/28/2021) 30 tablet 0   rosuvastatin (CRESTOR) 40 MG tablet Take 1 tablet (40 mg total) by mouth daily. 90 tablet 3   tadalafil (CIALIS) 20 MG tablet Take 1 tablet (20 mg total) by mouth daily as needed for erectile dysfunction. 10 tablet 11   venlafaxine (EFFEXOR) 75 MG tablet Take 1 tablet (75 mg total) by mouth 2 (two) times daily. 180 tablet 3   No facility-administered medications prior to visit.    PAST MEDICAL HISTORY: Past Medical History:  Diagnosis Date    Allergy    RHINITIS   Colonic polyp    Diverticulosis    Dyslipidemia    Hemorrhoids    Hepatitis C    treated with interferon and Ribiviran  with negative viral loads   Hypertension    Pneumonia    as a child   Sleep apnea    Sleep apnea    CPAP   Smoker     PAST SURGICAL HISTORY: Past Surgical History:  Procedure Laterality Date   ANTERIOR CRUCIATE LIGAMENT REPAIR  2006   EYE SURGERY     cataract 2018  bil ---1/ 2019  lasik right eye 2019  -- 2020 laser left eye removed a film growing over cataract   SKIN BIOPSY Left    Occipital scalp shave &ED&C  basal cell carinoma, nodular pattern   SKIN BIOPSY Right 05/24/2019   squamous cell carcinoma, keratoacanthoma type,features of regression   SKIN BIOPSY Bilateral 05/21/2020   Hypertrophic actinic keratosis   UMBILICAL HERNIA REPAIR N/A 04/26/2019   Procedure: UMBILICAL HERNIA REPAIR WITH MESH;  Surgeon: Coralie Keens, MD;  Location: [facility];  Service: General;  Laterality: N/A;    FAMILY HISTORY: Family History  Problem Relation Age of Onset   Arthritis Mother    Hypertension Father    Heart disease Maternal Grandfather     SOCIAL HISTORY: Social History   Socioeconomic History   Marital status: Married    Spouse name: Not on file   Number of children: Not on file   Years of education: Not on file   Highest education level: Not on file  Occupational History   Not on file  Tobacco Use   Smoking status: Former    Types: Cigarettes    Quit date: 10/26/2016    Years since quitting: 4.8   Smokeless tobacco: Never  Vaping Use   Vaping Use: Never used  Substance and Sexual Activity   Alcohol use: Yes    Alcohol/week: 15.0 standard drinks    Types: 15 Cans of beer per week   Drug use: No   Sexual activity: Yes  Other Topics Concern   Not on file  Social History Narrative   Not on file   Social Determinants of Health   Financial Resource Strain: Not on file  Food Insecurity: Not on file  Transportation  Needs: Not on file  Physical Activity: Not on file  Stress: Not on file  Social Connections: Not on file  Intimate Partner Violence: Not on file      PHYSICAL EXAM  There were no vitals filed for this visit.  There is no height or weight on file to calculate BMI.  Generalized: Well developed, in no acute distress  Chest: Lungs clear to auscultation bilaterally  Neurological examination  Mentation: Alert oriented to time, place, history taking. Follows all commands speech and language fluent Cranial nerve II-XII: Extraocular movements were full, visual field were full on confrontational test Head turning and shoulder shrug  were normal and symmetric. Motor: The motor testing reveals 5 over 5 strength of all 4 extremities. Good symmetric motor tone is noted throughout.  Sensory: Sensory testing is intact to soft touch on all 4 extremities. No evidence of extinction is noted.  Gait and station: Gait is normal.    DIAGNOSTIC DATA (LABS, IMAGING, TESTING) - I reviewed patient records, labs, notes, testing and imaging myself where available.  Lab Results  Component Value Date   WBC 5.1 05/20/2021   HGB 14.7 05/20/2021   HCT 43.8 05/20/2021   MCV 91 05/20/2021   PLT 207 05/20/2021      Component Value Date/Time   NA 141 05/20/2021 0932   K 4.4 05/20/2021 0932   CL 103 05/20/2021 0932   CO2 23 05/20/2021 0932   GLUCOSE 108 (H) 05/20/2021 0932   GLUCOSE 103 (H) 04/19/2019 1104   BUN 13 05/20/2021 0932   CREATININE 0.82 05/20/2021 0932   CREATININE 1.10 10/08/2016 1459   CALCIUM 9.4 05/20/2021 0932   PROT 6.5 05/20/2021 0932   ALBUMIN 4.3 05/20/2021 0932   AST 25 05/20/2021 0932   ALT 25 05/20/2021 0932   ALKPHOS 72 05/20/2021 0932   BILITOT 0.3 05/20/2021 0932   GFRNONAA 87 05/21/2020 1158   GFRAA 100 05/21/2020 1158   Lab Results  Component Value Date   CHOL 162 05/20/2021   HDL 43 05/20/2021   LDLCALC 93 05/20/2021   TRIG 147 05/20/2021   CHOLHDL 3.8  05/20/2021   Lab Results  Component Value Date   HGBA1C 5.7 (A) 05/28/2021      ASSESSMENT AND PLAN 71 y.o. year old male  has a past medical history of Allergy, Colonic polyp, Diverticulosis, Dyslipidemia, Hemorrhoids, Hepatitis C, Hypertension, Pneumonia, Sleep apnea, Sleep apnea, and Smoker. here with :  Obstructive sleep apnea on CPAP  Good compliance Good treatment of apnea Encourage patient to use CPAP nightly and greater than 4 hours each night Follow-up in 1 year or sooner if needed       Ward Givens, MSN, NP-C 09/09/2021, 1:50 PM [facility] [address] [place],  [address] [phone]

## 2021-09-09 NOTE — Telephone Encounter (Signed)
Done KH 

## 2021-11-11 DIAGNOSIS — Z1211 Encounter for screening for malignant neoplasm of colon: Secondary | ICD-10-CM | POA: Diagnosis not present

## 2021-11-11 DIAGNOSIS — K5730 Diverticulosis of large intestine without perforation or abscess without bleeding: Secondary | ICD-10-CM | POA: Diagnosis not present

## 2021-11-11 DIAGNOSIS — K648 Other hemorrhoids: Secondary | ICD-10-CM | POA: Diagnosis not present

## 2021-11-11 DIAGNOSIS — Z86010 Personal history of colonic polyps: Secondary | ICD-10-CM | POA: Diagnosis not present

## 2021-11-13 DIAGNOSIS — K5730 Diverticulosis of large intestine without perforation or abscess without bleeding: Secondary | ICD-10-CM | POA: Insufficient documentation

## 2021-11-19 ENCOUNTER — Encounter: Payer: Self-pay | Admitting: Family Medicine

## 2021-12-08 DIAGNOSIS — G4733 Obstructive sleep apnea (adult) (pediatric): Secondary | ICD-10-CM | POA: Diagnosis not present

## 2021-12-09 DIAGNOSIS — D1801 Hemangioma of skin and subcutaneous tissue: Secondary | ICD-10-CM | POA: Diagnosis not present

## 2021-12-09 DIAGNOSIS — D485 Neoplasm of uncertain behavior of skin: Secondary | ICD-10-CM | POA: Diagnosis not present

## 2021-12-09 DIAGNOSIS — D225 Melanocytic nevi of trunk: Secondary | ICD-10-CM | POA: Diagnosis not present

## 2021-12-09 DIAGNOSIS — H401131 Primary open-angle glaucoma, bilateral, mild stage: Secondary | ICD-10-CM | POA: Diagnosis not present

## 2021-12-09 DIAGNOSIS — L814 Other melanin hyperpigmentation: Secondary | ICD-10-CM | POA: Diagnosis not present

## 2021-12-09 DIAGNOSIS — Z85828 Personal history of other malignant neoplasm of skin: Secondary | ICD-10-CM | POA: Diagnosis not present

## 2021-12-09 DIAGNOSIS — H524 Presbyopia: Secondary | ICD-10-CM | POA: Diagnosis not present

## 2021-12-09 DIAGNOSIS — L570 Actinic keratosis: Secondary | ICD-10-CM | POA: Diagnosis not present

## 2021-12-09 DIAGNOSIS — Z961 Presence of intraocular lens: Secondary | ICD-10-CM | POA: Diagnosis not present

## 2021-12-09 DIAGNOSIS — L820 Inflamed seborrheic keratosis: Secondary | ICD-10-CM | POA: Diagnosis not present

## 2021-12-09 DIAGNOSIS — L821 Other seborrheic keratosis: Secondary | ICD-10-CM | POA: Diagnosis not present

## 2021-12-09 DIAGNOSIS — C44622 Squamous cell carcinoma of skin of right upper limb, including shoulder: Secondary | ICD-10-CM | POA: Diagnosis not present

## 2021-12-09 HISTORY — PX: SKIN BIOPSY: SHX1

## 2021-12-15 DIAGNOSIS — H401131 Primary open-angle glaucoma, bilateral, mild stage: Secondary | ICD-10-CM | POA: Diagnosis not present

## 2021-12-19 ENCOUNTER — Encounter: Payer: Self-pay | Admitting: Family Medicine

## 2021-12-19 DIAGNOSIS — D0460 Carcinoma in situ of skin of unspecified upper limb, including shoulder: Secondary | ICD-10-CM | POA: Insufficient documentation

## 2021-12-25 DIAGNOSIS — M1711 Unilateral primary osteoarthritis, right knee: Secondary | ICD-10-CM | POA: Diagnosis not present

## 2022-02-07 ENCOUNTER — Other Ambulatory Visit: Payer: Self-pay | Admitting: Family Medicine

## 2022-02-07 DIAGNOSIS — G8929 Other chronic pain: Secondary | ICD-10-CM

## 2022-02-11 ENCOUNTER — Encounter: Payer: Self-pay | Admitting: Family Medicine

## 2022-03-16 DIAGNOSIS — H401131 Primary open-angle glaucoma, bilateral, mild stage: Secondary | ICD-10-CM | POA: Diagnosis not present

## 2022-04-15 ENCOUNTER — Encounter: Payer: Self-pay | Admitting: Internal Medicine

## 2022-05-25 ENCOUNTER — Telehealth: Payer: Self-pay | Admitting: Family Medicine

## 2022-05-25 DIAGNOSIS — R768 Other specified abnormal immunological findings in serum: Secondary | ICD-10-CM

## 2022-05-25 DIAGNOSIS — Z125 Encounter for screening for malignant neoplasm of prostate: Secondary | ICD-10-CM

## 2022-05-25 NOTE — Telephone Encounter (Signed)
Andrew Yang is coming in tomorrow for his fasting labs for his CPE, he wants to make sure that you test for Hep C and Psa

## 2022-05-26 ENCOUNTER — Other Ambulatory Visit: Payer: Medicare HMO

## 2022-05-26 ENCOUNTER — Other Ambulatory Visit: Payer: Self-pay | Admitting: *Deleted

## 2022-05-26 DIAGNOSIS — G8929 Other chronic pain: Secondary | ICD-10-CM | POA: Diagnosis not present

## 2022-05-26 DIAGNOSIS — R768 Other specified abnormal immunological findings in serum: Secondary | ICD-10-CM | POA: Diagnosis not present

## 2022-05-26 DIAGNOSIS — Z125 Encounter for screening for malignant neoplasm of prostate: Secondary | ICD-10-CM

## 2022-05-26 DIAGNOSIS — M25562 Pain in left knee: Secondary | ICD-10-CM | POA: Diagnosis not present

## 2022-05-26 DIAGNOSIS — M170 Bilateral primary osteoarthritis of knee: Secondary | ICD-10-CM | POA: Diagnosis not present

## 2022-05-27 LAB — HCV RNA QUANT: Hepatitis C Quantitation: NOT DETECTED IU/mL

## 2022-05-27 LAB — PSA: Prostate Specific Ag, Serum: 0.3 ng/mL (ref 0.0–4.0)

## 2022-06-01 ENCOUNTER — Encounter: Payer: Self-pay | Admitting: Internal Medicine

## 2022-06-02 ENCOUNTER — Encounter: Payer: Self-pay | Admitting: Family Medicine

## 2022-06-02 ENCOUNTER — Ambulatory Visit (INDEPENDENT_AMBULATORY_CARE_PROVIDER_SITE_OTHER): Payer: Medicare HMO | Admitting: Family Medicine

## 2022-06-02 VITALS — BP 130/86 | HR 60 | Ht 71.75 in | Wt 217.8 lb

## 2022-06-02 DIAGNOSIS — N529 Male erectile dysfunction, unspecified: Secondary | ICD-10-CM

## 2022-06-02 DIAGNOSIS — R7302 Impaired glucose tolerance (oral): Secondary | ICD-10-CM

## 2022-06-02 DIAGNOSIS — M549 Dorsalgia, unspecified: Secondary | ICD-10-CM

## 2022-06-02 DIAGNOSIS — E785 Hyperlipidemia, unspecified: Secondary | ICD-10-CM | POA: Diagnosis not present

## 2022-06-02 DIAGNOSIS — Z8659 Personal history of other mental and behavioral disorders: Secondary | ICD-10-CM | POA: Diagnosis not present

## 2022-06-02 DIAGNOSIS — G4733 Obstructive sleep apnea (adult) (pediatric): Secondary | ICD-10-CM

## 2022-06-02 DIAGNOSIS — Z0000 Encounter for general adult medical examination without abnormal findings: Secondary | ICD-10-CM | POA: Diagnosis not present

## 2022-06-02 DIAGNOSIS — J309 Allergic rhinitis, unspecified: Secondary | ICD-10-CM

## 2022-06-02 DIAGNOSIS — I700 Atherosclerosis of aorta: Secondary | ICD-10-CM

## 2022-06-02 DIAGNOSIS — I7090 Unspecified atherosclerosis: Secondary | ICD-10-CM

## 2022-06-02 DIAGNOSIS — R69 Illness, unspecified: Secondary | ICD-10-CM | POA: Diagnosis not present

## 2022-06-02 DIAGNOSIS — Z86010 Personal history of colonic polyps: Secondary | ICD-10-CM | POA: Diagnosis not present

## 2022-06-02 DIAGNOSIS — G8929 Other chronic pain: Secondary | ICD-10-CM

## 2022-06-02 DIAGNOSIS — M1990 Unspecified osteoarthritis, unspecified site: Secondary | ICD-10-CM

## 2022-06-02 DIAGNOSIS — I10 Essential (primary) hypertension: Secondary | ICD-10-CM

## 2022-06-02 DIAGNOSIS — F341 Dysthymic disorder: Secondary | ICD-10-CM

## 2022-06-02 MED ORDER — VENLAFAXINE HCL 75 MG PO TABS: 75.0000 mg | ORAL_TABLET | Freq: Two times a day (BID) | ORAL | 3 refills | Status: DC

## 2022-06-02 MED ORDER — LOSARTAN POTASSIUM-HCTZ 100-12.5 MG PO TABS: 1.0000 | ORAL_TABLET | Freq: Every day | ORAL | 3 refills | Status: DC

## 2022-06-02 MED ORDER — CELECOXIB 200 MG PO CAPS: 200.0000 mg | ORAL_CAPSULE | Freq: Two times a day (BID) | ORAL | 1 refills | Status: DC

## 2022-06-02 MED ORDER — ROSUVASTATIN CALCIUM 40 MG PO TABS: 40.0000 mg | ORAL_TABLET | Freq: Every day | ORAL | 3 refills | Status: DC

## 2022-06-02 NOTE — Progress Notes (Signed)
Andrew Yang is a 71 y.o. male who presents for annual wellness visit,CPE and follow-up on chronic medical conditions.  He has has no particular concerns or complaints.  His immunizations are up-to-date.  He did have a colonoscopy and this last 1 was essentially clear.  His allergies seem to be under good control.  He does use Skelaxin for occasional backache.  Continues on losartan/HCTZ for his blood pressure as well as Celebrex for muscle aches and pains.  Crestor is causing no aches or pains.  He apparently does not need Cialis at the present time.  Continues to do quite nicely on Effexor on a regular basis.  Work and home life are going quite well. He does have x-ray evidence of atherosclerosis.  He does have OSA and is using CPAP with fairly good results.  He does have arthritic aches and pains and usually handles this with OTC medications.  He does have a ED but is not in need of Cialis at the present time.  I later  Immunizations and Health Maintenance Immunization History  Administered Date(s) Administered   Fluad Quad(high Dose 65+) 04/18/2019, 05/09/2020, 05/20/2021, 05/11/2022   Hepatitis A 10/23/1999, 05/05/2000   Hepatitis B 10/13/1999, 12/24/1999, 05/05/2000   IPV 04/26/2008   Influenza Whole 07/25/2007, 04/09/2011   Influenza, High Dose Seasonal PF 07/21/2016, 06/04/2017, 04/25/2018   Influenza, Seasonal, Injecte, Preservative Fre 07/26/2012   Influenza,inj,Quad PF,6+ Mos 04/17/2014, 06/13/2015   MMR 07/15/2007   Meningococcal Conjugate 04/26/2008   PFIZER(Purple Top)SARS-COV-2 Vaccination 09/16/2019, 10/11/2019, 05/09/2020   Pfizer Covid-19 Vaccine Bivalent Booster 85yrs & up 05/20/2021   Pneumococcal Conjugate-13 10/26/2016   Pneumococcal Polysaccharide-23 11/15/2017   Respiratory Syncytial Virus Vaccine,Recomb Aduvanted(Arexvy) 05/11/2022   Td 01/05/1984, 05/16/2004, 04/26/2008   Td (Adult), 2 Lf Tetanus Toxid, Preservative Free 01/05/1984, 05/16/2004, 04/26/2008   Tdap  04/26/2008, 07/26/2012, 12/17/2019   Typhoid Inactivated 04/26/2008   Unspecified SARS-COV-2 Vaccination 05/11/2022   Yellow Fever 04/26/2008   Zoster Recombinat (Shingrix) 10/27/2017, 05/23/2019, 05/23/2019   Zoster, Live 07/26/2012   There are no preventive care reminders to display for this patient.  Last colonoscopy:11/11/21-Dr.Medoff Last PSA:05/26/22 Dentist:Dr. Naomie Dean Ophtho:Dr. Nile Riggs Exercise:cardio on stationary bike 2-3hrs per week and light weights 15 min 2 x a week  Other doctors caring for patient include: Optho-[facility]-Dr. Karlyn Agee Pain/Neuro- Dr. Salem Caster, Dr. Sherlean Foot, Dr. Rennis Chris GI- Dr.Medoff  Advanced Directives: Does Patient Have a Medical Advance Directive?: Yes Type of Advance Directive: Healthcare Power of Attorney, Living will, Out of facility DNR (pink MOST or yellow form) Does patient want to make changes to medical advance directive?: No - Patient declined Copy of Healthcare Power of Attorney in Chart?: Yes - validated most recent copy scanned in chart (See row information)  Depression screen:  See questionnaire below.        06/02/2022    3:12 PM 08/28/2021   11:59 AM 05/28/2021    3:31 PM 12/10/2020    1:37 PM 09/03/2020   10:52 AM  Depression screen PHQ 2/9  Decreased Interest 0 0 0 0 0  Down, Depressed, Hopeless 0 0 0 0 0  PHQ - 2 Score 0 0 0 0 0  Altered sleeping 0 0  0   Tired, decreased energy 0 0  0   Change in appetite 0 0  0   Feeling bad or failure about yourself  0 0  0   Trouble concentrating 0 0  0   Moving slowly or fidgety/restless 0 0  0  Suicidal thoughts 0 0  0   PHQ-9 Score 0 0  0   Difficult doing work/chores Not difficult at all Not difficult at all  Not difficult at all     Fall Screen: See Questionaire below.      06/02/2022    3:12 PM 05/28/2021    3:30 PM 05/27/2020    3:03 PM 11/15/2017    2:56 PM 07/29/2017    1:39 PM  [place] in the past year? 0 0 0 No No  Number falls in past  yr: 0 0     Injury with Fall? 0 0     Risk for fall due to : No Fall Risks No Fall Risks     Follow up Falls evaluation completed Falls evaluation completed       ADL screen:  See questionnaire below.  Functional Status Survey: Is the patient deaf or have difficulty hearing?: No Does the patient have difficulty seeing, even when wearing glasses/contacts?: No Does the patient have difficulty concentrating, remembering, or making decisions?: No Does the patient have difficulty walking or climbing stairs?: No Does the patient have difficulty dressing or bathing?: No Does the patient have difficulty doing errands alone such as visiting a doctor's office or shopping?: No   Review of Systems  Constitutional: -, -unexpected weight change, -anorexia, -fatigue Allergy: -sneezing, -itching, -congestion Dermatology: denies changing moles, rash, lumps ENT: -runny nose, -ear pain, -sore throat,  Cardiology:  -chest pain, -palpitations, -orthopnea, Respiratory: -cough, -shortness of breath, -dyspnea on exertion, -wheezing,  Gastroenterology: -abdominal pain, -nausea, -vomiting, -diarrhea, -constipation, -dysphagia Hematology: -bleeding or bruising problems Musculoskeletal: -arthralgias, -myalgias, -joint swelling, -back pain, - Ophthalmology: -vision changes,  Urology: -dysuria, -difficulty urinating,  -urinary frequency, -urgency, incontinence Neurology: -, -numbness, , -memory loss, -falls, -dizziness    PHYSICAL EXAM:  BP 130/86   Pulse 60   Ht 5' 11.75" (1.822 m)   Wt 217 lb 12.8 oz (98.8 kg)   BMI 29.75 kg/m   General Appearance: Alert, cooperative, no distress, appears stated age Head: Normocephalic, without obvious abnormality, atraumatic Eyes: PERRL, conjunctiva/corneas clear, EOM's intact,  Ears: Normal TM's and external ear canals Nose: Nares normal, mucosa normal, no drainage or sinus   tenderness Throat: Lips, mucosa, and tongue normal; teeth and gums normal Neck:  Supple, no lymphadenopathy, thyroid:no enlargement/tenderness/nodules; no carotid bruit or JVD Lungs: Clear to auscultation bilaterally without wheezes, rales or ronchi; respirations unlabored Heart: Regular rate and rhythm, S1 and S2 normal, no murmur, rub or gallop Abdomen: Soft, non-tender, nondistended, normoactive bowel sounds, no masses, no hepatosplenomegaly Extremities: No clubbing, cyanosis or edema Pulses: 2+ and symmetric all extremities Skin: Skin color, texture, turgor normal, no rashes or lesions Lymph nodes: Cervical, supraclavicular, and axillary nodes normal Neurologic: CNII-XII intact, normal strength, sensation and gait; reflexes 2+ and symmetric throughout   Psych: Normal mood, affect, hygiene and grooming  ASSESSMENT/PLAN: Routine general medical examination at a health care facility - Plan: CBC with Differential/Platelet, Comprehensive metabolic panel, Lipid panel  Atherosclerosis of aorta (HCC)  Primary hypertension - Plan: CBC with Differential/Platelet, Comprehensive metabolic panel  Chronic allergic rhinitis  Obstructive sleep apnea treated with continuous positive airway pressure (CPAP)  Glucose intolerance (impaired glucose tolerance) - Plan: CBC with Differential/Platelet, Comprehensive metabolic panel  Arthritis  Erectile dysfunction, unspecified erectile dysfunction type  H/O dysthymia  History of colonic polyps  Hyperlipidemia with target low density lipoprotein (LDL) cholesterol less than 100 mg/dL - Plan: Lipid panel, rosuvastatin (CRESTOR) 40 MG  tablet  Essential hypertension - Plan: losartan-hydrochlorothiazide (HYZAAR) 100-12.5 MG tablet  Atherosclerosis - Plan: rosuvastatin (CRESTOR) 40 MG tablet  Chronic back pain, unspecified back location, unspecified back pain laterality - Plan: celecoxib (CELEBREX) 200 MG capsule  Dysthymia - Plan: venlafaxine (EFFEXOR) 75 MG tablet    Discussed at least 30 minutes of aerobic activity at least 5  days/week;  Immunization recommendations discussed.  Colonoscopy recommendations reviewed.   Medicare Attestation I have personally reviewed: The patient's medical and social history Their use of alcohol, tobacco or illicit drugs Their current medications and supplements The patient's functional ability including ADLs,fall risks, home safety risks, cognitive, and hearing and visual impairment Diet and physical activities Evidence for depression or mood disorders  The patient's weight, height, and BMI have been recorded in the chart.  I have made referrals, counseling, and provided education to the patient based on review of the above and I have provided the patient with a written personalized care plan for preventive services.     Jill Alexanders, MD   06/02/2022

## 2022-06-03 ENCOUNTER — Encounter: Payer: Self-pay | Admitting: Family Medicine

## 2022-06-03 LAB — CBC WITH DIFFERENTIAL/PLATELET
Basophils Absolute: 0.1 10*3/uL (ref 0.0–0.2)
Basos: 1 %
EOS (ABSOLUTE): 0 10*3/uL (ref 0.0–0.4)
Eos: 1 %
Hematocrit: 45.4 % (ref 37.5–51.0)
Hemoglobin: 15.5 g/dL (ref 13.0–17.7)
Immature Grans (Abs): 0 10*3/uL (ref 0.0–0.1)
Immature Granulocytes: 0 %
Lymphocytes Absolute: 2 10*3/uL (ref 0.7–3.1)
Lymphs: 24 %
MCH: 31.4 pg (ref 26.6–33.0)
MCHC: 34.1 g/dL (ref 31.5–35.7)
MCV: 92 fL (ref 79–97)
Monocytes Absolute: 0.8 10*3/uL (ref 0.1–0.9)
Monocytes: 10 %
Neutrophils Absolute: 5.4 10*3/uL (ref 1.4–7.0)
Neutrophils: 64 %
Platelets: 217 10*3/uL (ref 150–450)
RBC: 4.93 x10E6/uL (ref 4.14–5.80)
RDW: 13 % (ref 11.6–15.4)
WBC: 8.2 10*3/uL (ref 3.4–10.8)

## 2022-06-03 LAB — COMPREHENSIVE METABOLIC PANEL
ALT: 26 IU/L (ref 0–44)
AST: 19 IU/L (ref 0–40)
Albumin/Globulin Ratio: 1.8 (ref 1.2–2.2)
Albumin: 4.6 g/dL (ref 3.8–4.8)
Alkaline Phosphatase: 84 IU/L (ref 44–121)
BUN/Creatinine Ratio: 34 — ABNORMAL HIGH (ref 10–24)
BUN: 27 mg/dL (ref 8–27)
Bilirubin Total: 0.4 mg/dL (ref 0.0–1.2)
CO2: 21 mmol/L (ref 20–29)
Calcium: 8.7 mg/dL (ref 8.6–10.2)
Chloride: 101 mmol/L (ref 96–106)
Creatinine, Ser: 0.8 mg/dL (ref 0.76–1.27)
Globulin, Total: 2.6 g/dL (ref 1.5–4.5)
Glucose: 105 mg/dL — ABNORMAL HIGH (ref 70–99)
Potassium: 4.4 mmol/L (ref 3.5–5.2)
Sodium: 139 mmol/L (ref 134–144)
Total Protein: 7.2 g/dL (ref 6.0–8.5)
eGFR: 95 mL/min/{1.73_m2} (ref >59)

## 2022-06-03 LAB — LIPID PANEL
Chol/HDL Ratio: 3.8 ratio (ref 0.0–5.0)
Cholesterol, Total: 202 mg/dL — ABNORMAL HIGH (ref 100–199)
HDL: 53 mg/dL (ref >39)
LDL Chol Calc (NIH): 125 mg/dL — ABNORMAL HIGH (ref 0–99)
Triglycerides: 137 mg/dL (ref 0–149)
VLDL Cholesterol Cal: 24 mg/dL (ref 5–40)

## 2022-06-16 DIAGNOSIS — Z85828 Personal history of other malignant neoplasm of skin: Secondary | ICD-10-CM | POA: Diagnosis not present

## 2022-06-16 DIAGNOSIS — L814 Other melanin hyperpigmentation: Secondary | ICD-10-CM | POA: Diagnosis not present

## 2022-06-16 DIAGNOSIS — L821 Other seborrheic keratosis: Secondary | ICD-10-CM | POA: Diagnosis not present

## 2022-06-16 DIAGNOSIS — D225 Melanocytic nevi of trunk: Secondary | ICD-10-CM | POA: Diagnosis not present

## 2022-06-16 DIAGNOSIS — D692 Other nonthrombocytopenic purpura: Secondary | ICD-10-CM | POA: Diagnosis not present

## 2022-06-16 DIAGNOSIS — C44729 Squamous cell carcinoma of skin of left lower limb, including hip: Secondary | ICD-10-CM | POA: Diagnosis not present

## 2022-06-16 DIAGNOSIS — D485 Neoplasm of uncertain behavior of skin: Secondary | ICD-10-CM | POA: Diagnosis not present

## 2022-06-16 DIAGNOSIS — D1801 Hemangioma of skin and subcutaneous tissue: Secondary | ICD-10-CM | POA: Diagnosis not present

## 2022-06-16 DIAGNOSIS — L570 Actinic keratosis: Secondary | ICD-10-CM | POA: Diagnosis not present

## 2022-06-16 DIAGNOSIS — L738 Other specified follicular disorders: Secondary | ICD-10-CM | POA: Diagnosis not present

## 2022-06-17 DIAGNOSIS — H401131 Primary open-angle glaucoma, bilateral, mild stage: Secondary | ICD-10-CM | POA: Diagnosis not present

## 2022-06-24 DIAGNOSIS — S51812A Laceration without foreign body of left forearm, initial encounter: Secondary | ICD-10-CM | POA: Diagnosis not present

## 2022-06-24 DIAGNOSIS — Z23 Encounter for immunization: Secondary | ICD-10-CM | POA: Diagnosis not present

## 2022-06-24 DIAGNOSIS — S0101XA Laceration without foreign body of scalp, initial encounter: Secondary | ICD-10-CM | POA: Diagnosis not present

## 2022-06-24 DIAGNOSIS — W548XXA Other contact with dog, initial encounter: Secondary | ICD-10-CM | POA: Diagnosis not present

## 2022-07-06 DIAGNOSIS — Z4802 Encounter for removal of sutures: Secondary | ICD-10-CM | POA: Diagnosis not present

## 2022-08-11 ENCOUNTER — Telehealth: Payer: Self-pay | Admitting: Adult Health

## 2022-08-11 DIAGNOSIS — G4733 Obstructive sleep apnea (adult) (pediatric): Secondary | ICD-10-CM | POA: Diagnosis not present

## 2022-08-11 NOTE — Telephone Encounter (Signed)
Pt is asking if the Rx for his CPAP can be sent to him by my chart

## 2022-08-12 NOTE — Telephone Encounter (Signed)
I called and spoke to pt and was able to move the appt to tomorrow for him to discuss about cpap and in mtn range.  He states not able to get enough pressure.  (He says oxygen) when using at higher elevations.  We moved up appt to address prior to his trip.  He will be getting travel cpap to.

## 2022-08-12 NOTE — Telephone Encounter (Signed)
Appt was made mychart VV, ESS sent to pt via mychart to do.  Last DL was snipped to chart for appt tomorrow.

## 2022-08-13 ENCOUNTER — Encounter: Payer: Medicare HMO | Admitting: Adult Health

## 2022-08-13 NOTE — Telephone Encounter (Signed)
Pt appointment was rescheduled 08/17/2022 with Megan,NP

## 2022-08-13 NOTE — Progress Notes (Signed)
PATIENT: Andrew Yang DOB: 01/22/1951  REASON FOR VISIT: follow up HISTORY FROM: patient PRIMARY NEUROLOGIST:   Chief Complaint  Patient presents with   Follow-up    Pt in 4  Pt here for CPAP f/u  Pt has Pt has a travel CPAP. Pt has questions about air pressure on travel CPAP      HISTORY OF PRESENT ILLNESS: Today 08/17/22:  Andrew Yang is a 72 year old male with a history of obstructive sleep apnea on CPAP.  He returns today for follow-up.  He reports that he has his regular machine and a travel machine.  He states that when he travels to higher altitudes he feels that he is not getting enough pressure.  His download is below    REVIEW OF SYSTEMS: Out of a complete 14 system review of symptoms, the patient complains only of the following symptoms, and all other reviewed systems are negative.  FSS 5 ESS 6  ALLERGIES: Allergies  Allergen Reactions   Lisinopril Cough    HOME MEDICATIONS: Outpatient Medications Prior to Visit  Medication Sig Dispense Refill   acetaminophen (TYLENOL) 500 MG tablet Take 1,000 mg by mouth as needed.     Apoaequorin (PREVAGEN PO) Take by mouth.     Ascorbic Acid (VITAMIN C) 1000 MG tablet Take 1,000 mg by mouth daily.     Butalbital-APAP-Caffeine 50-300-40 MG CAPS Take 50 mg by mouth as needed. 30 capsule 2   celecoxib (CELEBREX) 200 MG capsule Take 1 capsule (200 mg total) by mouth 2 (two) times daily. 90 capsule 1   cholecalciferol (VITAMIN D3) 25 MCG (1000 UT) tablet Take 1,000 Units by mouth daily.     Clobetasol Propionate 0.05 % shampoo SMARTSIG:1 Topical Twice a Week PRN     Ginkgo Biloba Extract 60 MG CAPS Take 120 mg by mouth daily.     GINSENG PO Take 2 tablets by mouth daily.     Glucosamine-Chondroitin (GLUCOSAMINE CHONDR COMPLEX PO) Take 2 tablets by mouth daily.      hydrOXYzine (ATARAX/VISTARIL) 10 MG tablet Take 1 tablet (10 mg total) by mouth 3 (three) times daily as needed. 30 tablet 0   losartan-hydrochlorothiazide  (HYZAAR) 100-12.5 MG tablet Take 1 tablet by mouth daily. 90 tablet 3   metaxalone (SKELAXIN) 800 MG tablet TAKE 1 TABLET (800 MG TOTAL) THREE TIMES A DAY BY MOUTH AS NEEDED 90 tablet 0   Multiple Vitamin (MULTIVITAMIN WITH MINERALS) TABS tablet Take 1 tablet by mouth daily.     rosuvastatin (CRESTOR) 40 MG tablet Take 1 tablet (40 mg total) by mouth daily. 90 tablet 3   tadalafil (CIALIS) 20 MG tablet Take 1 tablet (20 mg total) by mouth daily as needed for erectile dysfunction. 10 tablet 11   venlafaxine (EFFEXOR) 75 MG tablet Take 1 tablet (75 mg total) by mouth 2 (two) times daily. 180 tablet 3   No facility-administered medications prior to visit.    PAST MEDICAL HISTORY: Past Medical History:  Diagnosis Date   Allergy    RHINITIS   Colonic polyp    Diverticulosis    Dyslipidemia    Hemorrhoids    Hepatitis C    treated with interferon and Ribiviran  with negative viral loads   Hypertension    Pneumonia    as a child   Sleep apnea    Sleep apnea    CPAP   Smoker     PAST SURGICAL HISTORY: Past Surgical History:  Procedure Laterality Date  ANTERIOR CRUCIATE LIGAMENT REPAIR  2006   EYE SURGERY     cataract 2018  bil ---1/ 2019  lasik right eye 2019  -- 2020 laser left eye removed a film growing over cataract   SKIN BIOPSY Left    Occipital scalp shave &ED&C  basal cell carinoma, nodular pattern   SKIN BIOPSY Right 05/24/2019   squamous cell carcinoma, keratoacanthoma type,features of regression   SKIN BIOPSY Bilateral 05/21/2020   Hypertrophic actinic keratosis   SKIN BIOPSY Right 12/09/2021   well differentiated squamous cell carcinoma of the fore arm   UMBILICAL HERNIA REPAIR N/A 04/26/2019   Procedure: UMBILICAL HERNIA REPAIR WITH MESH;  Surgeon: Coralie Keens, MD;  Location: [facility];  Service: General;  Laterality: N/A;    FAMILY HISTORY: Family History  Problem Relation Age of Onset   Arthritis Mother    Hypertension Father    Heart disease Maternal  Grandfather    Sleep apnea Neg Hx     SOCIAL HISTORY: Social History   Socioeconomic History   Marital status: Married    Spouse name: Not on file   Number of children: Not on file   Years of education: Not on file   Highest education level: Not on file  Occupational History   Not on file  Tobacco Use   Smoking status: Former    Types: Cigarettes    Quit date: 10/26/2016    Years since quitting: 5.8   Smokeless tobacco: Never   Tobacco comments:    Pt states smokes cigars 6x a week   Vaping Use   Vaping Use: Never used  Substance and Sexual Activity   Alcohol use: Yes    Alcohol/week: 12.0 standard drinks of alcohol    Types: 12 Cans of beer per week   Drug use: No   Sexual activity: Yes  Other Topics Concern   Not on file  Social History Narrative   Not on file   Social Determinants of Health   Financial Resource Strain: Not on file  Food Insecurity: Not on file  Transportation Needs: Not on file  Physical Activity: Not on file  Stress: Not on file  Social Connections: Not on file  Intimate Partner Violence: Not on file      PHYSICAL EXAM  Vitals:   08/17/22 1523  BP: (!) 148/93  Pulse: 73  Weight: 227 lb 3.2 oz (103.1 kg)  Height: 6' (1.829 m)   Body mass index is 30.81 kg/m.  Generalized: Well developed, in no acute distress  Chest: Lungs clear to auscultation bilaterally  Neurological examination  Mentation: Alert oriented to time, place, history taking. Follows all commands speech and language fluent Cranial nerve II-XII: Extraocular movements were full, visual field were full on confrontational test Head turning and shoulder shrug  were normal and symmetric.    DIAGNOSTIC DATA (LABS, IMAGING, TESTING) - I reviewed patient records, labs, notes, testing and imaging myself where available.  Lab Results  Component Value Date   WBC 8.2 06/02/2022   HGB 15.5 06/02/2022   HCT 45.4 06/02/2022   MCV 92 06/02/2022   PLT 217 06/02/2022       Component Value Date/Time   NA 139 06/02/2022 1558   K 4.4 06/02/2022 1558   CL 101 06/02/2022 1558   CO2 21 06/02/2022 1558   GLUCOSE 105 (H) 06/02/2022 1558   GLUCOSE 103 (H) 04/19/2019 1104   BUN 27 06/02/2022 1558   CREATININE 0.80 06/02/2022 1558   CREATININE 1.10 10/08/2016  1459   CALCIUM 8.7 06/02/2022 1558   PROT 7.2 06/02/2022 1558   ALBUMIN 4.6 06/02/2022 1558   AST 19 06/02/2022 1558   ALT 26 06/02/2022 1558   ALKPHOS 84 06/02/2022 1558   BILITOT 0.4 06/02/2022 1558   GFRNONAA 87 05/21/2020 1158   GFRAA 100 05/21/2020 1158   Lab Results  Component Value Date   CHOL 202 (H) 06/02/2022   HDL 53 06/02/2022   LDLCALC 125 (H) 06/02/2022   TRIG 137 06/02/2022   CHOLHDL 3.8 06/02/2022   Lab Results  Component Value Date   HGBA1C 5.7 (A) 05/28/2021   No results found for: "VITAMINB12" No results found for: "TSH"    ASSESSMENT AND PLAN 72 y.o. year old male  has a past medical history of Allergy, Colonic polyp, Diverticulosis, Dyslipidemia, Hemorrhoids, Hepatitis C, Hypertension, Pneumonia, Sleep apnea, Sleep apnea, and Smoker. here with:  OSA on CPAP  - CPAP compliance excellent - Good treatment of AHI  - Encourage patient to use CPAP nightly and > 4 hours each night - Pressure increased to 10cm H20 EPR 1 - F/U in 1 year or sooner if needed     Ward Givens, MSN, NP-C 08/17/2022, 3:28 PM [facility] [address], [place], [place] [address] [phone]

## 2022-08-13 NOTE — Progress Notes (Signed)
This encounter was created in error - please disregard.

## 2022-08-17 ENCOUNTER — Encounter: Payer: Self-pay | Admitting: Adult Health

## 2022-08-17 ENCOUNTER — Ambulatory Visit (INDEPENDENT_AMBULATORY_CARE_PROVIDER_SITE_OTHER): Payer: Medicare HMO | Admitting: Adult Health

## 2022-08-17 VITALS — BP 148/93 | HR 73 | Ht 72.0 in | Wt 227.2 lb

## 2022-08-17 DIAGNOSIS — G4733 Obstructive sleep apnea (adult) (pediatric): Secondary | ICD-10-CM | POA: Diagnosis not present

## 2022-08-19 NOTE — Progress Notes (Signed)
New, Willodean Rosenthal, RN; Alonna Minium; Minus [place]; Nash Shearer Received, Thank you!     Previous Messages    ----- Message ----- From: Brandon Melnick, RN Sent: 08/18/2022   4:37 PM EST To: Darlina Guys; Miquel Dunn; Nash Shearer; * Subject: increase pressure 10 h20 EPR 1 for both curr*  New order in [place]. Andrew Yang, 72 y.o., Andrew Yang MRN: [id number]   [facility]

## 2022-08-26 DIAGNOSIS — M25561 Pain in right knee: Secondary | ICD-10-CM | POA: Diagnosis not present

## 2022-08-26 DIAGNOSIS — G8929 Other chronic pain: Secondary | ICD-10-CM | POA: Diagnosis not present

## 2022-09-08 ENCOUNTER — Telehealth: Payer: Medicare HMO | Admitting: Adult Health

## 2022-09-11 DIAGNOSIS — G4733 Obstructive sleep apnea (adult) (pediatric): Secondary | ICD-10-CM | POA: Diagnosis not present

## 2022-09-14 DIAGNOSIS — B9689 Other specified bacterial agents as the cause of diseases classified elsewhere: Secondary | ICD-10-CM | POA: Diagnosis not present

## 2022-09-14 DIAGNOSIS — J208 Acute bronchitis due to other specified organisms: Secondary | ICD-10-CM | POA: Diagnosis not present

## 2022-09-14 DIAGNOSIS — J019 Acute sinusitis, unspecified: Secondary | ICD-10-CM | POA: Diagnosis not present

## 2022-09-14 DIAGNOSIS — J039 Acute tonsillitis, unspecified: Secondary | ICD-10-CM | POA: Diagnosis not present

## 2022-10-10 DIAGNOSIS — G4733 Obstructive sleep apnea (adult) (pediatric): Secondary | ICD-10-CM | POA: Diagnosis not present

## 2022-10-14 DIAGNOSIS — J069 Acute upper respiratory infection, unspecified: Secondary | ICD-10-CM | POA: Diagnosis not present

## 2022-11-11 ENCOUNTER — Other Ambulatory Visit: Payer: Self-pay | Admitting: Family Medicine

## 2022-11-11 DIAGNOSIS — G8929 Other chronic pain: Secondary | ICD-10-CM

## 2022-11-18 DIAGNOSIS — H524 Presbyopia: Secondary | ICD-10-CM | POA: Diagnosis not present

## 2022-11-18 DIAGNOSIS — Z961 Presence of intraocular lens: Secondary | ICD-10-CM | POA: Diagnosis not present

## 2022-11-18 DIAGNOSIS — H401131 Primary open-angle glaucoma, bilateral, mild stage: Secondary | ICD-10-CM | POA: Diagnosis not present

## 2022-11-19 DIAGNOSIS — M25561 Pain in right knee: Secondary | ICD-10-CM | POA: Diagnosis not present

## 2022-11-19 DIAGNOSIS — M1711 Unilateral primary osteoarthritis, right knee: Secondary | ICD-10-CM | POA: Diagnosis not present

## 2022-11-19 DIAGNOSIS — G8929 Other chronic pain: Secondary | ICD-10-CM | POA: Diagnosis not present

## 2022-11-21 ENCOUNTER — Other Ambulatory Visit: Payer: Self-pay | Admitting: Family Medicine

## 2022-11-21 DIAGNOSIS — G8929 Other chronic pain: Secondary | ICD-10-CM

## 2022-12-23 DIAGNOSIS — D225 Melanocytic nevi of trunk: Secondary | ICD-10-CM | POA: Diagnosis not present

## 2022-12-23 DIAGNOSIS — Z85828 Personal history of other malignant neoplasm of skin: Secondary | ICD-10-CM | POA: Diagnosis not present

## 2022-12-23 DIAGNOSIS — D1801 Hemangioma of skin and subcutaneous tissue: Secondary | ICD-10-CM | POA: Diagnosis not present

## 2022-12-23 DIAGNOSIS — L570 Actinic keratosis: Secondary | ICD-10-CM | POA: Diagnosis not present

## 2022-12-23 DIAGNOSIS — C44629 Squamous cell carcinoma of skin of left upper limb, including shoulder: Secondary | ICD-10-CM | POA: Diagnosis not present

## 2022-12-23 DIAGNOSIS — L821 Other seborrheic keratosis: Secondary | ICD-10-CM | POA: Diagnosis not present

## 2022-12-23 DIAGNOSIS — C44729 Squamous cell carcinoma of skin of left lower limb, including hip: Secondary | ICD-10-CM | POA: Diagnosis not present

## 2022-12-23 DIAGNOSIS — D485 Neoplasm of uncertain behavior of skin: Secondary | ICD-10-CM | POA: Diagnosis not present

## 2022-12-23 DIAGNOSIS — D692 Other nonthrombocytopenic purpura: Secondary | ICD-10-CM | POA: Diagnosis not present

## 2022-12-24 HISTORY — PX: SQUAMOUS CELL CARCINOMA EXCISION: SHX2433

## 2023-01-08 ENCOUNTER — Encounter: Payer: Self-pay | Admitting: Internal Medicine

## 2023-03-02 DIAGNOSIS — H401131 Primary open-angle glaucoma, bilateral, mild stage: Secondary | ICD-10-CM | POA: Diagnosis not present

## 2023-03-02 DIAGNOSIS — H5211 Myopia, right eye: Secondary | ICD-10-CM | POA: Diagnosis not present

## 2023-03-02 DIAGNOSIS — H5202 Hypermetropia, left eye: Secondary | ICD-10-CM | POA: Diagnosis not present

## 2023-03-23 ENCOUNTER — Ambulatory Visit (INDEPENDENT_AMBULATORY_CARE_PROVIDER_SITE_OTHER): Payer: Medicare HMO

## 2023-03-23 DIAGNOSIS — Z0000 Encounter for general adult medical examination without abnormal findings: Secondary | ICD-10-CM | POA: Diagnosis not present

## 2023-03-23 NOTE — Patient Instructions (Signed)
Andrew Yang , Thank you for taking time to come for your Medicare Wellness Visit. I appreciate your ongoing commitment to your health goals. Please review the following plan we discussed and let me know if I can assist you in the future.   Referrals/Orders/Follow-Ups/Clinician Recommendations: none  This is a list of the screening recommended for you and due dates:  Health Maintenance  Topic Date Due   COVID-19 Vaccine (6 - 2023-24 season) 07/06/2022   Flu Shot  03/11/2023   Medicare Annual Wellness Visit  03/22/2024   DTaP/Tdap/Td vaccine (10 - Td or Tdap) 12/16/2029   Colon Cancer Screening  11/12/2031   Pneumonia Vaccine  Completed   Hepatitis C Screening  Completed   Zoster (Shingles) Vaccine  Completed   HPV Vaccine  Aged Out    Advanced directives: (In Chart) A copy of your advanced directives are scanned into your chart should your provider ever need it.  Next Medicare Annual Wellness Visit scheduled for next year: Yes  Preventive Care 56 Years and Older, Male  Preventive care refers to lifestyle choices and visits with your health care provider that can promote health and wellness. What does preventive care include? A yearly physical exam. This is also called an annual well check. Dental exams once or twice a year. Routine eye exams. Ask your health care provider how often you should have your eyes checked. Personal lifestyle choices, including: Daily care of your teeth and gums. Regular physical activity. Eating a healthy diet. Avoiding tobacco and drug use. Limiting alcohol use. Practicing safe sex. Taking low doses of aspirin every day. Taking vitamin and mineral supplements as recommended by your health care provider. What happens during an annual well check? The services and screenings done by your health care provider during your annual well check will depend on your age, overall health, lifestyle risk factors, and family history of disease. Counseling  Your  health care provider may ask you questions about your: Alcohol use. Tobacco use. Drug use. Emotional well-being. Home and relationship well-being. Sexual activity. Eating habits. History of falls. Memory and ability to understand (cognition). Work and work Astronomer. Screening  You may have the following tests or measurements: Height, weight, and BMI. Blood pressure. Lipid and cholesterol levels. These may be checked every 5 years, or more frequently if you are over 3 years old. Skin check. Lung cancer screening. You may have this screening every year starting at age 83 if you have a 30-pack-year history of smoking and currently smoke or have quit within the past 15 years. Fecal occult blood test (FOBT) of the stool. You may have this test every year starting at age 91. Flexible sigmoidoscopy or colonoscopy. You may have a sigmoidoscopy every 5 years or a colonoscopy every 10 years starting at age 66. Prostate cancer screening. Recommendations will vary depending on your family history and other risks. Hepatitis C blood test. Hepatitis B blood test. Sexually transmitted disease (STD) testing. Diabetes screening. This is done by checking your blood sugar (glucose) after you have not eaten for a while (fasting). You may have this done every 1-3 years. Abdominal aortic aneurysm (AAA) screening. You may need this if you are a current or former smoker. Osteoporosis. You may be screened starting at age 61 if you are at high risk. Talk with your health care provider about your test results, treatment options, and if necessary, the need for more tests. Vaccines  Your health care provider may recommend certain vaccines, such as: Influenza vaccine.  This is recommended every year. Tetanus, diphtheria, and acellular pertussis (Tdap, Td) vaccine. You may need a Td booster every 10 years. Zoster vaccine. You may need this after age 59. Pneumococcal 13-valent conjugate (PCV13) vaccine. One dose  is recommended after age 79. Pneumococcal polysaccharide (PPSV23) vaccine. One dose is recommended after age 72. Talk to your health care provider about which screenings and vaccines you need and how often you need them. This information is not intended to replace advice given to you by your health care provider. Make sure you discuss any questions you have with your health care provider. Document Released: 08/23/2015 Document Revised: 04/15/2016 Document Reviewed: 05/28/2015 Elsevier Interactive Patient Education  2017 [employer].  Fall Prevention in the Home Falls can cause injuries. They can happen to people of all ages. There are many things you can do to make your home safe and to help prevent falls. What can I do on the outside of my home? Regularly fix the edges of walkways and driveways and fix any cracks. Remove anything that might make you trip as you walk through a door, such as a raised step or threshold. Trim any bushes or trees on the path to your home. Use bright outdoor lighting. Clear any walking paths of anything that might make someone trip, such as rocks or tools. Regularly check to see if handrails are loose or broken. Make sure that both sides of any steps have handrails. Any raised decks and porches should have guardrails on the edges. Have any leaves, snow, or ice cleared regularly. Use sand or salt on walking paths during winter. Clean up any spills in your garage right away. This includes oil or grease spills. What can I do in the bathroom? Use night lights. Install grab bars by the toilet and in the tub and shower. Do not use towel bars as grab bars. Use non-skid mats or decals in the tub or shower. If you need to sit down in the shower, use a plastic, non-slip stool. Keep the floor dry. Clean up any water that spills on the floor as soon as it happens. Remove soap buildup in the tub or shower regularly. Attach bath mats securely with double-sided non-slip rug  tape. Do not have throw rugs and other things on the floor that can make you trip. What can I do in the bedroom? Use night lights. Make sure that you have a light by your bed that is easy to reach. Do not use any sheets or blankets that are too big for your bed. They should not hang down onto the floor. Have a firm chair that has side arms. You can use this for support while you get dressed. Do not have throw rugs and other things on the floor that can make you trip. What can I do in the kitchen? Clean up any spills right away. Avoid walking on wet floors. Keep items that you use a lot in easy-to-reach places. If you need to reach something above you, use a strong step stool that has a grab bar. Keep electrical cords out of the way. Do not use floor polish or wax that makes floors slippery. If you must use wax, use non-skid floor wax. Do not have throw rugs and other things on the floor that can make you trip. What can I do with my stairs? Do not leave any items on the stairs. Make sure that there are handrails on both sides of the stairs and use them. Fix handrails  that are broken or loose. Make sure that handrails are as long as the stairways. Check any carpeting to make sure that it is firmly attached to the stairs. Fix any carpet that is loose or worn. Avoid having throw rugs at the top or bottom of the stairs. If you do have throw rugs, attach them to the floor with carpet tape. Make sure that you have a light switch at the top of the stairs and the bottom of the stairs. If you do not have them, ask someone to add them for you. What else can I do to help prevent falls? Wear shoes that: Do not have high heels. Have rubber bottoms. Are comfortable and fit you well. Are closed at the toe. Do not wear sandals. If you use a stepladder: Make sure that it is fully opened. Do not climb a closed stepladder. Make sure that both sides of the stepladder are locked into place. Ask someone to  hold it for you, if possible. Clearly mark and make sure that you can see: Any grab bars or handrails. First and last steps. Where the edge of each step is. Use tools that help you move around (mobility aids) if they are needed. These include: Canes. Walkers. Scooters. Crutches. Turn on the lights when you go into a dark area. Replace any light bulbs as soon as they burn out. Set up your furniture so you have a clear path. Avoid moving your furniture around. If any of your floors are uneven, fix them. If there are any pets around you, be aware of where they are. Review your medicines with your doctor. Some medicines can make you feel dizzy. This can increase your chance of falling. Ask your doctor what other things that you can do to help prevent falls. This information is not intended to replace advice given to you by your health care provider. Make sure you discuss any questions you have with your health care provider. Document Released: 05/23/2009 Document Revised: 01/02/2016 Document Reviewed: 08/31/2014 Elsevier Interactive Patient Education  2017 [employer].

## 2023-03-23 NOTE — Progress Notes (Signed)
Subjective:   Andrew Yang is a 72 y.o. male who presents for Medicare Annual/Subsequent preventive examination.  Visit Complete: Virtual  I connected with  Andrew Yang on 03/23/23 by a audio enabled telemedicine application and verified that I am speaking with the correct person using two identifiers.  Patient Location: Home  Provider Location: Office/Clinic  I discussed the limitations of evaluation and management by telemedicine. The patient expressed understanding and agreed to proceed.  Patient Medicare AWV questionnaire was completed by the patient on 03/22/2023; I have confirmed that all information answered by patient is correct and no changes since this date.  Vital Signs: Unable to obtain new vitals due to this being a telehealth visit.  Review of Systems     Cardiac Risk Factors include: advanced age (>55men, >39 women);dyslipidemia;hypertension;male gender     Objective:    Today's Vitals   03/23/23 0811  PainSc: 1    There is no height or weight on file to calculate BMI.     03/23/2023    8:14 AM 06/02/2022    3:15 PM 05/28/2021    3:29 PM 05/27/2020    3:01 PM 04/19/2019   10:38 AM 12/13/2018    8:35 AM 11/15/2017    3:30 PM  Advanced Directives  Does Patient Have a Medical Advance Directive? Yes Yes Yes Yes Yes Yes Yes  Type of Estate agent of [place];Living will Healthcare Power of [place];Living will;Out of facility DNR (pink MOST or yellow form) Living will;Healthcare Power of [employer] of [place];Living will Healthcare Power of [place];Living will Healthcare Power of [place];Living will Living will;Healthcare Power of Attorney  Does patient want to make changes to medical advance directive?  No - Patient declined No - Patient declined  No - Patient declined  No - Patient declined  Copy of Healthcare Power of Attorney in Chart? Yes - validated most recent copy scanned in chart (See row information) Yes - validated  most recent copy scanned in chart (See row information) Yes - validated most recent copy scanned in chart (See row information) Yes - validated most recent copy scanned in chart (See row information) No - copy requested Yes - validated most recent copy scanned in chart (See row information)     Current Medications (verified) Outpatient Encounter Medications as of 03/23/2023  Medication Sig   acetaminophen (TYLENOL) 500 MG tablet Take 1,000 mg by mouth as needed.   Apoaequorin (PREVAGEN PO) Take by mouth.   Ascorbic Acid (VITAMIN C) 1000 MG tablet Take 1,000 mg by mouth daily.   Butalbital-APAP-Caffeine 50-300-40 MG CAPS Take 50 mg by mouth as needed.   celecoxib (CELEBREX) 200 MG capsule TAKE 1 CAPSULE BY MOUTH TWICE A DAY   cholecalciferol (VITAMIN D3) 25 MCG (1000 UT) tablet Take 1,000 Units by mouth daily.   Clobetasol Propionate 0.05 % shampoo SMARTSIG:1 Topical Twice a Week PRN   Ginkgo Biloba Extract 60 MG CAPS Take 120 mg by mouth daily.   GINSENG PO Take 2 tablets by mouth daily.   Glucosamine-Chondroitin (GLUCOSAMINE CHONDR COMPLEX PO) Take 2 tablets by mouth daily.    latanoprost (XALATAN) 0.005 % ophthalmic solution 1 drop at bedtime.   losartan-hydrochlorothiazide (HYZAAR) 100-12.5 MG tablet Take 1 tablet by mouth daily.   metaxalone (SKELAXIN) 800 MG tablet TAKE 1 TABLET (800 MG TOTAL) THREE TIMES A DAY BY MOUTH AS NEEDED   Multiple Vitamin (MULTIVITAMIN WITH MINERALS) TABS tablet Take 1 tablet by mouth daily.   rosuvastatin (CRESTOR)  40 MG tablet Take 1 tablet (40 mg total) by mouth daily.   tadalafil (CIALIS) 20 MG tablet Take 1 tablet (20 mg total) by mouth daily as needed for erectile dysfunction.   venlafaxine (EFFEXOR) 75 MG tablet Take 1 tablet (75 mg total) by mouth 2 (two) times daily.   hydrOXYzine (ATARAX/VISTARIL) 10 MG tablet Take 1 tablet (10 mg total) by mouth 3 (three) times daily as needed.   No facility-administered encounter medications on file as of 03/23/2023.     Allergies (verified) Lisinopril   History: Past Medical History:  Diagnosis Date   Allergy    RHINITIS   Colonic polyp    Diverticulosis    Dyslipidemia    Hemorrhoids    Hepatitis C    treated with interferon and Ribiviran  with negative viral loads   Hypertension    Pneumonia    as a child   Sleep apnea    Sleep apnea    CPAP   Smoker    Past Surgical History:  Procedure Laterality Date   ANTERIOR CRUCIATE LIGAMENT REPAIR  2006   EYE SURGERY     cataract 2018  bil ---1/ 2019  lasik right eye 2019  -- 2020 laser left eye removed a film growing over cataract   SKIN BIOPSY Left    Occipital scalp shave &ED&C  basal cell carinoma, nodular pattern   SKIN BIOPSY Right 05/24/2019   squamous cell carcinoma, keratoacanthoma type,features of regression   SKIN BIOPSY Bilateral 05/21/2020   Hypertrophic actinic keratosis   SKIN BIOPSY Right 12/09/2021   well differentiated squamous cell carcinoma of the fore arm   SQUAMOUS CELL CARCINOMA EXCISION Left 12/24/2022   Dr. Arminda Resides of [facility]   UMBILICAL HERNIA REPAIR N/A 04/26/2019   Procedure: UMBILICAL HERNIA REPAIR WITH MESH;  Surgeon: Abigail Miyamoto, MD;  Location: [facility];  Service: General;  Laterality: N/A;   Family History  Problem Relation Age of Onset   Arthritis Mother    Hypertension Father    Heart disease Maternal Grandfather    Sleep apnea Neg Hx    Social History   Socioeconomic History   Marital status: Married    Spouse name: Not on file   Number of children: Not on file   Years of education: Not on file   Highest education level: Not on file  Occupational History   Not on file  Tobacco Use   Smoking status: Former    Current packs/day: 0.00    Types: Cigarettes    Quit date: 10/26/2016    Years since quitting: 6.4   Smokeless tobacco: Never   Tobacco comments:    Pt states smokes cigars 6x a week   Vaping Use   Vaping status: Never Used  Substance and Sexual Activity    Alcohol use: Yes    Alcohol/week: 12.0 standard drinks of alcohol    Types: 12 Cans of beer per week   Drug use: No   Sexual activity: Yes  Other Topics Concern   Not on file  Social History Narrative   Not on file   Social Determinants of Health   Financial Resource Strain: Low Risk  (03/22/2023)   Overall Financial Resource Strain (CARDIA)    Difficulty of Paying Living Expenses: Not hard at all  Food Insecurity: No Food Insecurity (03/22/2023)   Hunger Vital Sign    Worried About Running Out of Food in the Last Year: Never true    Ran Out of Food  in the Last Year: Never true  Transportation Needs: No Transportation Needs (03/22/2023)   PRAPARE - Administrator, Civil Service (Medical): No    Lack of Transportation (Non-Medical): No  Physical Activity: Insufficiently Active (03/22/2023)   Exercise Vital Sign    Days of Exercise per Week: 4 days    Minutes of Exercise per Session: 30 min  Stress: No Stress Concern Present (03/22/2023)   [employer] of Occupational Health - Occupational Stress Questionnaire    Feeling of Stress : Not at all  Social Connections: Unknown (03/22/2023)   Social Connection and Isolation Panel [NHANES]    Frequency of Communication with Friends and Family: More than three times a week    Frequency of Social Gatherings with Friends and Family: Three times a week    Attends Religious Services: Not on file    Active Member of Clubs or Organizations: Yes    Attends Banker Meetings: More than 4 times per year    Marital Status: Married    Tobacco Counseling Counseling given: Not Answered Tobacco comments: Pt states smokes cigars 6x a week    Clinical Intake:  Pre-visit preparation completed: Yes  Pain : 0-10 Pain Score: 1  Pain Type: Chronic pain Pain Location: Knee Pain Orientation: Left, Right Pain Descriptors / Indicators: Aching Pain Onset: More than a month ago Pain Frequency: Constant     Nutritional  Risks: None Diabetes: No  How often do you need to have someone help you when you read instructions, pamphlets, or other written materials from your doctor or pharmacy?: 1 - Never  Interpreter Needed?: No  Information entered by :: NAllen LPN   Activities of Daily Living    03/22/2023   10:44 AM 06/02/2022    3:16 PM  In your present state of health, do you have any difficulty performing the following activities:  Hearing? 0 0  Vision? 0 0  Difficulty concentrating or making decisions? 0 0  Walking or climbing stairs? 0 0  Dressing or bathing? 0 0  Doing errands, shopping? 0 0  Preparing Food and eating ? N N  Using the Toilet? N N  In the past six months, have you accidently leaked urine? N N  Do you have problems with loss of bowel control? N N  Managing your Medications? N N  Managing your Finances? N N  Housekeeping or managing your Housekeeping? N     Patient Care Team: Ronnald Nian, MD as PCP - General (Family Medicine)  Indicate any recent Medical Services you may have received from other than [facility] providers in the past year (date may be approximate).     Assessment:   This is a routine wellness examination for [facility].  Hearing/Vision screen Hearing Screening - Comments:: Denies hearing issues Vision Screening - Comments:: Regular eye exams, [facility]  Dietary issues and exercise activities discussed:     Goals Addressed             This Visit's Progress    Patient Stated       03/23/2023, denies goals at this time       Depression Screen    03/23/2023    8:17 AM 06/02/2022    3:12 PM 08/28/2021   11:59 AM 05/28/2021    3:31 PM 12/10/2020    1:37 PM 09/03/2020   10:52 AM 05/27/2020    3:04 PM  PHQ 2/9 Scores  PHQ - 2 Score 0 0 0 0 0  0 0  PHQ- 9 Score 0 0 0  0      Fall Risk    03/22/2023   10:44 AM 06/02/2022    3:12 PM 05/28/2021    3:30 PM 05/27/2020    3:03 PM 11/15/2017    2:56 PM  Fall Risk   Falls in the past year? 1 0 0 0  No  Comment had too much wine      Number falls in past yr: 1 0 0    Injury with Fall? 1 0 0    Risk for fall due to : History of fall(s);Medication side effect No Fall Risks No Fall Risks    Follow up Falls prevention discussed;Falls evaluation completed Falls evaluation completed Falls evaluation completed      MEDICARE RISK AT HOME:  Medicare Risk at Home - 03/23/23 0817     Any stairs in or around the home? Yes    If so, are there any without handrails? No    Home free of loose throw rugs in walkways, pet beds, electrical cords, etc? Yes    Adequate lighting in your home to reduce risk of falls? Yes    Life alert? No    Use of a cane, walker or w/c? No    Grab bars in the bathroom? No    Shower chair or bench in shower? No    Elevated toilet seat or a handicapped toilet? No             TIMED UP AND GO:  Was the test performed?  No    Cognitive Function:        03/23/2023    8:17 AM  6CIT Screen  What Year? 0 points  What month? 0 points  What time? 0 points  Count back from 20 0 points  Months in reverse 0 points  Repeat phrase 0 points  Total Score 0 points    Immunizations Immunization History  Administered Date(s) Administered   Fluad Quad(high Dose 65+) 04/18/2019, 05/09/2020, 05/20/2021, 05/11/2022   Hepatitis A 10/23/1999, 05/05/2000   Hepatitis B 10/13/1999, 12/24/1999, 05/05/2000   IPV 04/26/2008   Influenza Whole 07/25/2007, 04/09/2011   Influenza, High Dose Seasonal PF 07/21/2016, 06/04/2017, 04/25/2018   Influenza, Seasonal, Injecte, Preservative Fre 07/26/2012   Influenza,inj,Quad PF,6+ Mos 04/17/2014, 06/13/2015   MMR 07/15/2007   Meningococcal Conjugate 04/26/2008   PFIZER(Purple Top)SARS-COV-2 Vaccination 09/16/2019, 10/11/2019, 05/09/2020   Pfizer Covid-19 Vaccine Bivalent Booster 36yrs & up 05/20/2021   Pneumococcal Conjugate-13 10/26/2016   Pneumococcal Polysaccharide-23 11/15/2017   Respiratory Syncytial Virus Vaccine,Recomb  Aduvanted(Arexvy) 05/11/2022   Td 01/05/1984, 05/16/2004, 04/26/2008   Td (Adult), 2 Lf Tetanus Toxid, Preservative Free 01/05/1984, 05/16/2004, 04/26/2008   Tdap 04/26/2008, 07/26/2012, 12/17/2019   Typhoid Inactivated 04/26/2008   Unspecified SARS-COV-2 Vaccination 05/11/2022   Yellow Fever 04/26/2008   Zoster Recombinant(Shingrix) 10/27/2017, 05/23/2019, 05/23/2019   Zoster, Live 07/26/2012    TDAP status: Up to date  Flu Vaccine status: Due, Education has been provided regarding the importance of this vaccine. Advised may receive this vaccine at local pharmacy or Health Dept. Aware to provide a copy of the vaccination record if obtained from local pharmacy or Health Dept. Verbalized acceptance and understanding.  Pneumococcal vaccine status: Up to date  Covid-19 vaccine status: Completed vaccines  Qualifies for Shingles Vaccine? Yes   Zostavax completed Yes   Shingrix Completed?: Yes  Screening Tests Health Maintenance  Topic Date Due   COVID-19 Vaccine (6 - 2023-24 season) 07/06/2022  INFLUENZA VACCINE  03/11/2023   Medicare Annual Wellness (AWV)  03/22/2024   DTaP/Tdap/Td (10 - Td or Tdap) 12/16/2029   Colonoscopy  11/12/2031   Pneumonia Vaccine 90+ Years old  Completed   Hepatitis C Screening  Completed   Zoster Vaccines- Shingrix  Completed   HPV VACCINES  Aged Out    Health Maintenance  Health Maintenance Due  Topic Date Due   COVID-19 Vaccine (6 - 2023-24 season) 07/06/2022   INFLUENZA VACCINE  03/11/2023    Colorectal cancer screening: Type of screening: Colonoscopy. Completed 11/11/2021. Repeat every 10 years  Lung Cancer Screening: (Low Dose CT Chest recommended if Age 77-80 years, 20 pack-year currently smoking OR have quit w/in 15years.) does not qualify.   Lung Cancer Screening Referral: no  Additional Screening:  Hepatitis C Screening: does qualify; Completed 07/26/2012  Vision Screening: Recommended annual ophthalmology exams for early detection  of glaucoma and other disorders of the eye. Is the patient up to date with their annual eye exam?  Yes  Who is the provider or what is the name of the office in which the patient attends annual eye exams? [facility] If pt is not established with a provider, would they like to be referred to a provider to establish care? No .   Dental Screening: Recommended annual dental exams for proper oral hygiene  Diabetic Foot Exam: n/a  Community Resource Referral / Chronic Care Management: CRR required this visit?  No   CCM required this visit?  No     Plan:     I have personally reviewed and noted the following in the patient's chart:   Medical and social history Use of alcohol, tobacco or illicit drugs  Current medications and supplements including opioid prescriptions. Patient is not currently taking opioid prescriptions. Functional ability and status Nutritional status Physical activity Advanced directives List of other physicians Hospitalizations, surgeries, and ER visits in previous 12 months Vitals Screenings to include cognitive, depression, and falls Referrals and appointments  In addition, I have reviewed and discussed with patient certain preventive protocols, quality metrics, and best practice recommendations. A written personalized care plan for preventive services as well as general preventive health recommendations were provided to patient.     Barb Merino, LPN   04/07/5620   After Visit Summary: (MyChart) Due to this being a telephonic visit, the after visit summary with patients personalized plan was offered to patient via MyChart   Nurse Notes: none

## 2023-03-25 DIAGNOSIS — M25561 Pain in right knee: Secondary | ICD-10-CM | POA: Diagnosis not present

## 2023-03-25 DIAGNOSIS — G8929 Other chronic pain: Secondary | ICD-10-CM | POA: Diagnosis not present

## 2023-03-25 DIAGNOSIS — M1711 Unilateral primary osteoarthritis, right knee: Secondary | ICD-10-CM | POA: Diagnosis not present

## 2023-03-31 DIAGNOSIS — H401131 Primary open-angle glaucoma, bilateral, mild stage: Secondary | ICD-10-CM | POA: Diagnosis not present

## 2023-04-05 DIAGNOSIS — G4733 Obstructive sleep apnea (adult) (pediatric): Secondary | ICD-10-CM | POA: Diagnosis not present

## 2023-04-11 ENCOUNTER — Other Ambulatory Visit: Payer: Self-pay | Admitting: Family Medicine

## 2023-04-11 DIAGNOSIS — G8929 Other chronic pain: Secondary | ICD-10-CM

## 2023-05-06 DIAGNOSIS — G4733 Obstructive sleep apnea (adult) (pediatric): Secondary | ICD-10-CM | POA: Diagnosis not present

## 2023-05-23 DIAGNOSIS — S62646A Nondisplaced fracture of proximal phalanx of right little finger, initial encounter for closed fracture: Secondary | ICD-10-CM | POA: Diagnosis not present

## 2023-05-23 DIAGNOSIS — M79644 Pain in right finger(s): Secondary | ICD-10-CM | POA: Diagnosis not present

## 2023-05-23 DIAGNOSIS — S62656A Nondisplaced fracture of medial phalanx of right little finger, initial encounter for closed fracture: Secondary | ICD-10-CM | POA: Diagnosis not present

## 2023-05-23 DIAGNOSIS — W231XXA Caught, crushed, jammed, or pinched between stationary objects, initial encounter: Secondary | ICD-10-CM | POA: Diagnosis not present

## 2023-05-25 DIAGNOSIS — M79644 Pain in right finger(s): Secondary | ICD-10-CM | POA: Diagnosis not present

## 2023-06-01 ENCOUNTER — Telehealth: Payer: Self-pay | Admitting: *Deleted

## 2023-06-01 ENCOUNTER — Other Ambulatory Visit: Payer: Medicare HMO

## 2023-06-01 DIAGNOSIS — I10 Essential (primary) hypertension: Secondary | ICD-10-CM

## 2023-06-01 DIAGNOSIS — E785 Hyperlipidemia, unspecified: Secondary | ICD-10-CM

## 2023-06-01 DIAGNOSIS — Z209 Contact with and (suspected) exposure to unspecified communicable disease: Secondary | ICD-10-CM

## 2023-06-01 DIAGNOSIS — Z125 Encounter for screening for malignant neoplasm of prostate: Secondary | ICD-10-CM

## 2023-06-01 DIAGNOSIS — R768 Other specified abnormal immunological findings in serum: Secondary | ICD-10-CM | POA: Diagnosis not present

## 2023-06-01 DIAGNOSIS — Z0000 Encounter for general adult medical examination without abnormal findings: Secondary | ICD-10-CM

## 2023-06-01 NOTE — Telephone Encounter (Signed)
Looks like this patient is scheduled today for labs and has CPE on the 06/09/23-need orders please and thanks.

## 2023-06-01 NOTE — Telephone Encounter (Signed)
He also requesting Hep C and HIV and PSA with labs.

## 2023-06-02 LAB — LIPID PANEL
Chol/HDL Ratio: 3.7 ratio (ref 0.0–5.0)
Cholesterol, Total: 197 mg/dL (ref 100–199)
HDL: 53 mg/dL (ref >39)
LDL Chol Calc (NIH): 124 mg/dL — ABNORMAL HIGH (ref 0–99)
Triglycerides: 114 mg/dL (ref 0–149)
VLDL Cholesterol Cal: 20 mg/dL (ref 5–40)

## 2023-06-02 LAB — COMPREHENSIVE METABOLIC PANEL
ALT: 39 [IU]/L (ref 0–44)
AST: 28 [IU]/L (ref 0–40)
Albumin: 4.4 g/dL (ref 3.8–4.8)
Alkaline Phosphatase: 84 [IU]/L (ref 44–121)
BUN/Creatinine Ratio: 14 (ref 10–24)
BUN: 13 mg/dL (ref 8–27)
Bilirubin Total: 0.2 mg/dL (ref 0.0–1.2)
CO2: 25 mmol/L (ref 20–29)
Calcium: 9.4 mg/dL (ref 8.6–10.2)
Chloride: 101 mmol/L (ref 96–106)
Creatinine, Ser: 0.95 mg/dL (ref 0.76–1.27)
Globulin, Total: 2.2 g/dL (ref 1.5–4.5)
Glucose: 95 mg/dL (ref 70–99)
Potassium: 4.2 mmol/L (ref 3.5–5.2)
Sodium: 140 mmol/L (ref 134–144)
Total Protein: 6.6 g/dL (ref 6.0–8.5)
eGFR: 85 mL/min/{1.73_m2} (ref >59)

## 2023-06-02 LAB — CBC WITH DIFFERENTIAL/PLATELET
Basophils Absolute: 0.1 10*3/uL (ref 0.0–0.2)
Basos: 1 %
EOS (ABSOLUTE): 0.1 10*3/uL (ref 0.0–0.4)
Eos: 2 %
Hematocrit: 47.1 % (ref 37.5–51.0)
Hemoglobin: 15.6 g/dL (ref 13.0–17.7)
Immature Grans (Abs): 0 10*3/uL (ref 0.0–0.1)
Immature Granulocytes: 1 %
Lymphocytes Absolute: 2.4 10*3/uL (ref 0.7–3.1)
Lymphs: 31 %
MCH: 31.2 pg (ref 26.6–33.0)
MCHC: 33.1 g/dL (ref 31.5–35.7)
MCV: 94 fL (ref 79–97)
Monocytes Absolute: 0.8 10*3/uL (ref 0.1–0.9)
Monocytes: 11 %
Neutrophils Absolute: 4.2 10*3/uL (ref 1.4–7.0)
Neutrophils: 54 %
Platelets: 213 10*3/uL (ref 150–450)
RBC: 5 x10E6/uL (ref 4.14–5.80)
RDW: 13.2 % (ref 11.6–15.4)
WBC: 7.6 10*3/uL (ref 3.4–10.8)

## 2023-06-02 LAB — HIV ANTIBODY (ROUTINE TESTING W REFLEX): HIV Screen 4th Generation wRfx: NONREACTIVE

## 2023-06-02 LAB — HCV RNA QUANT: Hepatitis C Quantitation: NOT DETECTED [IU]/mL

## 2023-06-02 LAB — PSA: Prostate Specific Ag, Serum: 0.5 ng/mL (ref 0.0–4.0)

## 2023-06-05 DIAGNOSIS — G4733 Obstructive sleep apnea (adult) (pediatric): Secondary | ICD-10-CM | POA: Diagnosis not present

## 2023-06-09 ENCOUNTER — Ambulatory Visit (INDEPENDENT_AMBULATORY_CARE_PROVIDER_SITE_OTHER): Payer: Medicare HMO | Admitting: Family Medicine

## 2023-06-09 ENCOUNTER — Encounter: Payer: Self-pay | Admitting: Family Medicine

## 2023-06-09 VITALS — BP 126/80 | HR 73 | Ht 72.5 in | Wt 224.2 lb

## 2023-06-09 DIAGNOSIS — I10 Essential (primary) hypertension: Secondary | ICD-10-CM

## 2023-06-09 DIAGNOSIS — N5201 Erectile dysfunction due to arterial insufficiency: Secondary | ICD-10-CM | POA: Diagnosis not present

## 2023-06-09 DIAGNOSIS — R768 Other specified abnormal immunological findings in serum: Secondary | ICD-10-CM

## 2023-06-09 DIAGNOSIS — I7090 Unspecified atherosclerosis: Secondary | ICD-10-CM

## 2023-06-09 DIAGNOSIS — R7302 Impaired glucose tolerance (oral): Secondary | ICD-10-CM

## 2023-06-09 DIAGNOSIS — M1990 Unspecified osteoarthritis, unspecified site: Secondary | ICD-10-CM

## 2023-06-09 DIAGNOSIS — G8929 Other chronic pain: Secondary | ICD-10-CM

## 2023-06-09 DIAGNOSIS — J309 Allergic rhinitis, unspecified: Secondary | ICD-10-CM | POA: Diagnosis not present

## 2023-06-09 DIAGNOSIS — Z8659 Personal history of other mental and behavioral disorders: Secondary | ICD-10-CM

## 2023-06-09 DIAGNOSIS — M5489 Other dorsalgia: Secondary | ICD-10-CM | POA: Diagnosis not present

## 2023-06-09 DIAGNOSIS — Z0000 Encounter for general adult medical examination without abnormal findings: Secondary | ICD-10-CM

## 2023-06-09 DIAGNOSIS — Z860100 Personal history of colon polyps, unspecified: Secondary | ICD-10-CM

## 2023-06-09 DIAGNOSIS — G4733 Obstructive sleep apnea (adult) (pediatric): Secondary | ICD-10-CM

## 2023-06-09 DIAGNOSIS — N529 Male erectile dysfunction, unspecified: Secondary | ICD-10-CM

## 2023-06-09 DIAGNOSIS — G4710 Hypersomnia, unspecified: Secondary | ICD-10-CM

## 2023-06-09 DIAGNOSIS — E785 Hyperlipidemia, unspecified: Secondary | ICD-10-CM

## 2023-06-09 DIAGNOSIS — I700 Atherosclerosis of aorta: Secondary | ICD-10-CM

## 2023-06-09 MED ORDER — ROSUVASTATIN CALCIUM 40 MG PO TABS: 40.0000 mg | ORAL_TABLET | Freq: Every day | ORAL | 3 refills | Status: DC

## 2023-06-09 MED ORDER — TADALAFIL 20 MG PO TABS: 20.0000 mg | ORAL_TABLET | Freq: Every day | ORAL | 11 refills | Status: AC | PRN

## 2023-06-09 MED ORDER — LOSARTAN POTASSIUM-HCTZ 100-12.5 MG PO TABS: 1.0000 | ORAL_TABLET | Freq: Every day | ORAL | 3 refills | Status: DC

## 2023-06-09 NOTE — Progress Notes (Signed)
Complete physical exam  Patient: Andrew Yang   DOB: November 19, 1950   72 y.o. Male  MRN: [id number]  Subjective:    Chief Complaint  Patient presents with   Annual Exam    Andrew Yang is a 72 y.o. male who presents today for a complete physical exam. He reports consuming a general diet. Home exercise routine includes exercise bike for 30 minutes every morning. He generally feels fairly well. He reports sleeping well.  He continues on Effexor at 75 mg daily and is doing quite nicely on that.  He does occasionally have back pain and does take a muscle relaxer.  He continues on Crestor as well as losartan.  He uses Cialis and would like a refill on that.  Celebrex is working well for his aches and pains.  He does not have additional problems to discuss today.  Allergies seem to be under good control.  Recent hepatitis C screening was done and was negative.  He has had a colonoscopy and is scheduled for another one in around 10 years.     Most recent fall risk assessment:    03/22/2023   10:44 AM  Fall Risk   Falls in the past year? 1  Comment had too much wine  Number falls in past yr: 1  Injury with Fall? 1  Risk for fall due to : History of fall(s);Medication side effect  Follow up Falls prevention discussed;Falls evaluation completed     Most recent depression screenings:    03/23/2023    8:17 AM 06/02/2022    3:12 PM  PHQ 2/9 Scores  PHQ - 2 Score 0 0  PHQ- 9 Score 0 0    Vision:Within last year and Dental: No current dental problems and Last dental visit: March 2024    Patient Care Team: Ronnald Nian, MD as PCP - General (Family Medicine)   Outpatient Medications Prior to Visit  Medication Sig   acetaminophen (TYLENOL) 500 MG tablet Take 1,000 mg by mouth as needed.   Apoaequorin (PREVAGEN PO) Take by mouth.   Ascorbic Acid (VITAMIN C) 1000 MG tablet Take 1,000 mg by mouth daily.   Butalbital-APAP-Caffeine 50-300-40 MG CAPS Take 50 mg by mouth as needed.    celecoxib (CELEBREX) 200 MG capsule TAKE 1 CAPSULE BY MOUTH TWICE A DAY   cholecalciferol (VITAMIN D3) 25 MCG (1000 UT) tablet Take 1,000 Units by mouth daily.   Clobetasol Propionate 0.05 % shampoo SMARTSIG:1 Topical Twice a Week PRN   Ginkgo Biloba Extract 60 MG CAPS Take 120 mg by mouth daily.   GINSENG PO Take 2 tablets by mouth daily.   Glucosamine-Chondroitin (GLUCOSAMINE CHONDR COMPLEX PO) Take 2 tablets by mouth daily.    hydrOXYzine (ATARAX/VISTARIL) 10 MG tablet Take 1 tablet (10 mg total) by mouth 3 (three) times daily as needed.   latanoprost (XALATAN) 0.005 % ophthalmic solution 1 drop at bedtime.   losartan-hydrochlorothiazide (HYZAAR) 100-12.5 MG tablet Take 1 tablet by mouth daily.   metaxalone (SKELAXIN) 800 MG tablet TAKE 1 TABLET (800 MG TOTAL) THREE TIMES A DAY BY MOUTH AS NEEDED   Multiple Vitamin (MULTIVITAMIN WITH MINERALS) TABS tablet Take 1 tablet by mouth daily.   rosuvastatin (CRESTOR) 40 MG tablet Take 1 tablet (40 mg total) by mouth daily.   tadalafil (CIALIS) 20 MG tablet Take 1 tablet (20 mg total) by mouth daily as needed for erectile dysfunction.   venlafaxine (EFFEXOR) 75 MG tablet Take 1 tablet (75 mg total) by  mouth 2 (two) times daily.   No facility-administered medications prior to visit.    Review of Systems  All other systems reviewed and are negative.         Objective:      Physical Exam  Alert and in no distress. Tympanic membranes and canals are normal. Pharyngeal area is normal. Neck is supple without adenopathy or thyromegaly. Cardiac exam shows a regular sinus rhythm without murmurs or gallops. Lungs are clear to auscultation.       Assessment & Plan:    Routine general medical examination at a health care facility  Arthritis  Atherosclerosis of aorta (HCC)  Chronic allergic rhinitis  Other chronic back pain  Erectile dysfunction due to arterial insufficiency  Glucose intolerance (impaired glucose tolerance)  H/O  dysthymia  Hepatitis C antibody test positive  History of colonic polyps  Hyperlipidemia with target low density lipoprotein (LDL) cholesterol less than 100 mg/dL - Plan: rosuvastatin (CRESTOR) 40 MG tablet  Primary hypertension  Essential hypertension - Plan: losartan-hydrochlorothiazide (HYZAAR) 100-12.5 MG tablet  Atherosclerosis - Plan: rosuvastatin (CRESTOR) 40 MG tablet  Erectile dysfunction, unspecified erectile dysfunction type - Plan: tadalafil (CIALIS) 20 MG tablet  Immunization History  Administered Date(s) Administered   Fluad Quad(high Dose 65+) 04/18/2019, 05/09/2020, 05/20/2021, 05/11/2022   Hepatitis A 10/23/1999, 05/05/2000   Hepatitis B 10/13/1999, 12/24/1999, 05/05/2000   IPV 04/26/2008   Influenza Whole 07/25/2007, 04/09/2011   Influenza, High Dose Seasonal PF 07/21/2016, 06/04/2017, 04/25/2018, 05/15/2023   Influenza, Seasonal, Injecte, Preservative Fre 07/26/2012   Influenza,inj,Quad PF,6+ Mos 04/17/2014, 06/13/2015   MMR 07/15/2007   Meningococcal Conjugate 04/26/2008   PFIZER(Purple Top)SARS-COV-2 Vaccination 09/16/2019, 10/11/2019, 05/09/2020   Pfizer Covid-19 Vaccine Bivalent Booster 88yrs & up 05/20/2021   Pfizer(Comirnaty)Fall Seasonal Vaccine 12 years and older 05/15/2023   Pneumococcal Conjugate-13 10/26/2016   Pneumococcal Polysaccharide-23 11/15/2017   Respiratory Syncytial Virus Vaccine,Recomb Aduvanted(Arexvy) 05/11/2022   Td 01/05/1984, 05/16/2004, 04/26/2008   Td (Adult), 2 Lf Tetanus Toxid, Preservative Free 01/05/1984, 05/16/2004, 04/26/2008   Tdap 04/26/2008, 07/26/2012, 12/17/2019   Typhoid Inactivated 04/26/2008   Unspecified SARS-COV-2 Vaccination 05/11/2022   Yellow Fever 04/26/2008   Zoster Recombinant(Shingrix) 10/27/2017, 05/23/2019, 05/23/2019   Zoster, Live 07/26/2012    Health Maintenance  Topic Date Due   COVID-19 Vaccine (7 - 2023-24 season) 07/10/2023   Medicare Annual Wellness (AWV)  03/22/2024   DTaP/Tdap/Td (10 -  Td or Tdap) 12/16/2029   Colonoscopy  11/12/2031   Pneumonia Vaccine 71+ Years old  Completed   INFLUENZA VACCINE  Completed   Hepatitis C Screening  Completed   Zoster Vaccines- Shingrix  Completed   HPV VACCINES  Aged Out    Discussed the need for him to continue on his present medication regimen, cut down on alcohol consumption and work on better diet. Problem List Items Addressed This Visit       Cardiovascular and Mediastinum   Hypertension   Atherosclerosis of aorta (HCC)     Respiratory   Chronic allergic rhinitis   Obstructive sleep apnea treated with continuous positive airway pressure (CPAP)     Endocrine   Glucose intolerance (impaired glucose tolerance)     Musculoskeletal and Integument   Arthritis     Other   Hepatitis C antibody test positive   Hyperlipidemia with target low density lipoprotein (LDL) cholesterol less than 100 mg/dL   ED (erectile dysfunction)   Chronic back pain   Hypersomnia with sleep apnea   H/O dysthymia   History of colonic polyps  Other Visit Diagnoses     Routine general medical examination at a health care facility    -  Primary   Essential hypertension          No follow-ups on file.     Lawernce Pitts, CMA

## 2023-06-24 DIAGNOSIS — D225 Melanocytic nevi of trunk: Secondary | ICD-10-CM | POA: Diagnosis not present

## 2023-06-24 DIAGNOSIS — D485 Neoplasm of uncertain behavior of skin: Secondary | ICD-10-CM | POA: Diagnosis not present

## 2023-06-24 DIAGNOSIS — L821 Other seborrheic keratosis: Secondary | ICD-10-CM | POA: Diagnosis not present

## 2023-06-24 DIAGNOSIS — C44629 Squamous cell carcinoma of skin of left upper limb, including shoulder: Secondary | ICD-10-CM | POA: Diagnosis not present

## 2023-06-24 DIAGNOSIS — D0462 Carcinoma in situ of skin of left upper limb, including shoulder: Secondary | ICD-10-CM | POA: Diagnosis not present

## 2023-06-24 DIAGNOSIS — L570 Actinic keratosis: Secondary | ICD-10-CM | POA: Diagnosis not present

## 2023-06-24 DIAGNOSIS — D692 Other nonthrombocytopenic purpura: Secondary | ICD-10-CM | POA: Diagnosis not present

## 2023-06-24 DIAGNOSIS — Z85828 Personal history of other malignant neoplasm of skin: Secondary | ICD-10-CM | POA: Diagnosis not present

## 2023-06-24 HISTORY — PX: SKIN BIOPSY: SHX1

## 2023-07-07 ENCOUNTER — Encounter: Payer: Self-pay | Admitting: Family Medicine

## 2023-07-27 DIAGNOSIS — H401131 Primary open-angle glaucoma, bilateral, mild stage: Secondary | ICD-10-CM | POA: Diagnosis not present

## 2023-07-27 DIAGNOSIS — Z961 Presence of intraocular lens: Secondary | ICD-10-CM | POA: Diagnosis not present

## 2023-08-22 NOTE — Progress Notes (Addendum)
 PATIENT: Andrew Yang DOB: 1950-12-24  REASON FOR VISIT: follow up HISTORY FROM: patient  Virtual Visit via Video Note  I connected with Andrew Yang on 08/24/23 at  3:15 PM EST by a video enabled telemedicine application located remotely at [facility] and verified that I am speaking with the correct person using two identifiers who was located at their own home.   I discussed the limitations of evaluation and management by telemedicine and the availability of in person appointments. The patient expressed understanding and agreed to proceed.   PATIENT: Andrew Yang DOB: September 02, 1950  REASON FOR VISIT: follow up HISTORY FROM: patient  HISTORY OF PRESENT ILLNESS: Today 08/24/23  Andrew Yang is a 73 y.o. male with a history of OSA on CPAP. Returns today for follow-up.  He reports that the CPAP is working well.  Denies any new issues.  Download is below       REVIEW OF SYSTEMS: Out of a complete 14 system review of symptoms, the patient complains only of the following symptoms, and all other reviewed systems are negative.  ALLERGIES: Allergies  Allergen Reactions   Lisinopril  Cough    HOME MEDICATIONS: Outpatient Medications Prior to Visit  Medication Sig Dispense Refill   acetaminophen  (TYLENOL ) 500 MG tablet Take 1,000 mg by mouth as needed. (Patient not taking: Reported on 06/09/2023)     Apoaequorin (PREVAGEN PO) Take by mouth.     Ascorbic Acid (VITAMIN C) 1000 MG tablet Take 1,000 mg by mouth daily.     Butalbital -APAP-Caffeine  50-300-40 MG CAPS Take 50 mg by mouth as needed. (Patient not taking: Reported on 06/09/2023) 30 capsule 2   celecoxib  (CELEBREX ) 200 MG capsule TAKE 1 CAPSULE BY MOUTH TWICE A DAY 90 capsule 1   cholecalciferol (VITAMIN D3) 25 MCG (1000 UT) tablet Take 1,000 Units by mouth daily.     Clobetasol Propionate 0.05 % shampoo SMARTSIG:1 Topical Twice a Week PRN     Ginkgo Biloba Extract 60 MG CAPS Take 120 mg by  mouth daily.     GINSENG PO Take 2 tablets by mouth daily.     Glucosamine-Chondroitin (GLUCOSAMINE CHONDR COMPLEX PO) Take 2 tablets by mouth daily.      hydrOXYzine  (ATARAX /VISTARIL ) 10 MG tablet Take 1 tablet (10 mg total) by mouth 3 (three) times daily as needed. 30 tablet 0   latanoprost (XALATAN) 0.005 % ophthalmic solution 1 drop at bedtime.     losartan -hydrochlorothiazide  (HYZAAR) 100-12.5 MG tablet Take 1 tablet by mouth daily. 90 tablet 3   metaxalone  (SKELAXIN ) 800 MG tablet TAKE 1 TABLET (800 MG TOTAL) THREE TIMES A DAY BY MOUTH AS NEEDED (Patient not taking: Reported on 06/09/2023) 90 tablet 0   Multiple Vitamin (MULTIVITAMIN WITH MINERALS) TABS tablet Take 1 tablet by mouth daily.     rosuvastatin  (CRESTOR ) 40 MG tablet Take 1 tablet (40 mg total) by mouth daily. 90 tablet 3   tadalafil  (CIALIS ) 20 MG tablet Take 1 tablet (20 mg total) by mouth daily as needed for erectile dysfunction. 10 tablet 11   venlafaxine  (EFFEXOR ) 75 MG tablet Take 1 tablet (75 mg total) by mouth 2 (two) times daily. 180 tablet 3   No facility-administered medications prior to visit.    PAST MEDICAL HISTORY: Past Medical History:  Diagnosis Date   Allergy    RHINITIS   Colonic polyp    Diverticulosis    Dyslipidemia    Hemorrhoids    Hepatitis C  treated with interferon and Ribiviran  with negative viral loads   Hypertension    Pneumonia    as a child   Sleep apnea    Sleep apnea    CPAP   Smoker     PAST SURGICAL HISTORY: Past Surgical History:  Procedure Laterality Date   ANTERIOR CRUCIATE LIGAMENT REPAIR  2006   EYE SURGERY     cataract 2018  bil ---1/ 2019  lasik right eye 2019  -- 2020 laser left eye removed a film growing over cataract   SKIN BIOPSY Left    Occipital scalp shave &ED&C  basal cell carinoma, nodular pattern   SKIN BIOPSY Right 05/24/2019   squamous cell carcinoma, keratoacanthoma type,features of regression   SKIN BIOPSY Bilateral 05/21/2020   Hypertrophic  actinic keratosis   SKIN BIOPSY Right 12/09/2021   well differentiated squamous cell carcinoma of the fore arm   SKIN BIOPSY Left 06/24/2023   Well differentiated squamous cell carcinoma of medial elbow   SKIN BIOPSY Left 06/24/2023   squamous cell carcinoma in situ of forearm   SQUAMOUS CELL CARCINOMA EXCISION Left 12/24/2022   Dr. Toribio Molt of [facility]   UMBILICAL HERNIA REPAIR N/A 04/26/2019   Procedure: UMBILICAL HERNIA REPAIR WITH MESH;  Surgeon: Vernetta Berg, MD;  Location: [facility];  Service: General;  Laterality: N/A;    FAMILY HISTORY: Family History  Problem Relation Age of Onset   Arthritis Mother    Hypertension Father    Heart disease Maternal Grandfather    Sleep apnea Neg Hx     SOCIAL HISTORY: Social History   Socioeconomic History   Marital status: Married    Spouse name: Not on file   Number of children: Not on file   Years of education: Not on file   Highest education level: Not on file  Occupational History   Not on file  Tobacco Use   Smoking status: Former    Current packs/day: 0.00    Types: Cigarettes    Quit date: 10/26/2016    Years since quitting: 6.8   Smokeless tobacco: Never   Tobacco comments:    Pt states smokes cigars 6x a week   Vaping Use   Vaping status: Never Used  Substance and Sexual Activity   Alcohol use: Yes    Alcohol/week: 12.0 standard drinks of alcohol    Types: 12 Cans of beer per week   Drug use: No   Sexual activity: Yes  Other Topics Concern   Not on file  Social History Narrative   Not on file   Social Drivers of Health   Financial Resource Strain: Low Risk  (06/09/2023)   Overall Financial Resource Strain (CARDIA)    Difficulty of Paying Living Expenses: Not hard at all  Food Insecurity: No Food Insecurity (06/09/2023)   Hunger Vital Sign    Worried About Running Out of Food in the Last Year: Never true    Ran Out of Food in the Last Year: Never true  Transportation Needs: No  Transportation Needs (06/09/2023)   PRAPARE - Administrator, Civil Service (Medical): No    Lack of Transportation (Non-Medical): No  Physical Activity: Insufficiently Active (06/09/2023)   Exercise Vital Sign    Days of Exercise per Week: 6 days    Minutes of Exercise per Session: 20 min  Stress: No Stress Concern Present (06/09/2023)   [employer] of Occupational Health - Occupational Stress Questionnaire    Feeling of Stress :  Not at all  Social Connections: Moderately Integrated (06/09/2023)   Social Connection and Isolation Panel [NHANES]    Frequency of Communication with Friends and Family: More than three times a week    Frequency of Social Gatherings with Friends and Family: More than three times a week    Attends Religious Services: Never    Database Administrator or Organizations: Yes    Attends Engineer, Structural: More than 4 times per year    Marital Status: Married  Catering Manager Violence: Not At Risk (06/09/2023)   Humiliation, Afraid, Rape, and Kick questionnaire    Fear of Current or Ex-Partner: No    Emotionally Abused: No    Physically Abused: No    Sexually Abused: No      PHYSICAL EXAM Generalized: Well developed, in no acute distress   Neurological examination  Mentation: Alert oriented to time, place, history taking. Follows all commands speech and language fluent Cranial nerve II-XII:Extraocular movements were full. Facial symmetry noted. DIAGNOSTIC DATA (LABS, IMAGING, TESTING) - I reviewed patient records, labs, notes, testing and imaging myself where available.  Lab Results  Component Value Date   WBC 7.6 06/01/2023   HGB 15.6 06/01/2023   HCT 47.1 06/01/2023   MCV 94 06/01/2023   PLT 213 06/01/2023      Component Value Date/Time   NA 140 06/01/2023 0820   K 4.2 06/01/2023 0820   CL 101 06/01/2023 0820   CO2 25 06/01/2023 0820   GLUCOSE 95 06/01/2023 0820   GLUCOSE 103 (H) 04/19/2019 1104   BUN 13  06/01/2023 0820   CREATININE 0.95 06/01/2023 0820   CREATININE 1.10 10/08/2016 1459   CALCIUM  9.4 06/01/2023 0820   PROT 6.6 06/01/2023 0820   ALBUMIN 4.4 06/01/2023 0820   AST 28 06/01/2023 0820   ALT 39 06/01/2023 0820   ALKPHOS 84 06/01/2023 0820   BILITOT 0.2 06/01/2023 0820   GFRNONAA 87 05/21/2020 1158   GFRAA 100 05/21/2020 1158   Lab Results  Component Value Date   CHOL 197 06/01/2023   HDL 53 06/01/2023   LDLCALC 124 (H) 06/01/2023   TRIG 114 06/01/2023   CHOLHDL 3.7 06/01/2023   Lab Results  Component Value Date   HGBA1C 5.7 (A) 05/28/2021   No results found for: VITAMINB12 No results found for: TSH    ASSESSMENT AND PLAN 73 y.o. year old male  has a past medical history of Allergy, Colonic polyp, Diverticulosis, Dyslipidemia, Hemorrhoids, Hepatitis C, Hypertension, Pneumonia, Sleep apnea, Sleep apnea, and Smoker. here with:  OSA on CPAP  CPAP compliance excellent Residual AHI is good Encouraged patient to continue using CPAP nightly and > 4 hours each night F/U in 1 year or sooner if needed   Duwaine Russell, MSN, NP-C 08/27/2023, 9:08 AM [facility] [address], [address] [place], [place] [address] [phone]

## 2023-08-23 ENCOUNTER — Encounter: Payer: Self-pay | Admitting: *Deleted

## 2023-08-23 NOTE — Progress Notes (Signed)
ESS 3 

## 2023-08-24 ENCOUNTER — Telehealth: Payer: Medicare HMO | Admitting: Adult Health

## 2023-08-24 DIAGNOSIS — G4733 Obstructive sleep apnea (adult) (pediatric): Secondary | ICD-10-CM

## 2023-08-24 NOTE — Patient Instructions (Signed)
 Continue using CPAP nightly and greater than 4 hours each night If your symptoms worsen or you develop new symptoms please let us know.

## 2023-08-26 DIAGNOSIS — M1711 Unilateral primary osteoarthritis, right knee: Secondary | ICD-10-CM | POA: Diagnosis not present

## 2023-08-26 DIAGNOSIS — G8929 Other chronic pain: Secondary | ICD-10-CM | POA: Diagnosis not present

## 2023-08-26 DIAGNOSIS — M25561 Pain in right knee: Secondary | ICD-10-CM | POA: Diagnosis not present

## 2023-08-26 NOTE — Progress Notes (Addendum)
Update: New, Germaine Pomfret, RN; Natale Lay; 1 other Received, thank you!   Previous: Cpap supply order sent to [facility].

## 2023-08-30 DIAGNOSIS — M19011 Primary osteoarthritis, right shoulder: Secondary | ICD-10-CM | POA: Diagnosis not present

## 2023-09-02 NOTE — Code Documentation (Signed)
completed

## 2023-09-13 DIAGNOSIS — G4733 Obstructive sleep apnea (adult) (pediatric): Secondary | ICD-10-CM | POA: Diagnosis not present

## 2023-09-29 ENCOUNTER — Encounter: Payer: Self-pay | Admitting: Internal Medicine

## 2023-10-04 ENCOUNTER — Other Ambulatory Visit: Payer: Self-pay | Admitting: Family Medicine

## 2023-10-04 DIAGNOSIS — G8929 Other chronic pain: Secondary | ICD-10-CM

## 2023-10-04 DIAGNOSIS — F341 Dysthymic disorder: Secondary | ICD-10-CM

## 2023-10-05 ENCOUNTER — Encounter: Payer: Self-pay | Admitting: Internal Medicine

## 2023-10-05 DIAGNOSIS — G4733 Obstructive sleep apnea (adult) (pediatric): Secondary | ICD-10-CM | POA: Diagnosis not present

## 2023-10-27 DIAGNOSIS — J310 Chronic rhinitis: Secondary | ICD-10-CM | POA: Diagnosis not present

## 2023-10-27 DIAGNOSIS — J329 Chronic sinusitis, unspecified: Secondary | ICD-10-CM | POA: Diagnosis not present

## 2023-11-24 DIAGNOSIS — H401131 Primary open-angle glaucoma, bilateral, mild stage: Secondary | ICD-10-CM | POA: Diagnosis not present

## 2023-11-30 ENCOUNTER — Telehealth: Payer: Self-pay | Admitting: Family Medicine

## 2023-11-30 NOTE — Telephone Encounter (Signed)
 Copied from CRM [id number]. Topic: Appointments - Scheduling Inquiry for Clinic >> Nov 30, 2023 11:56 AM [facility] wrote: Reason for CRM: pt called in to see if someone can call him about rescheduling a cancelled physical appt with a new Doctor . He stated that he would rather talk to the office about it so if someone can call him .

## 2023-12-06 ENCOUNTER — Other Ambulatory Visit: Payer: Self-pay | Admitting: Medical

## 2023-12-06 DIAGNOSIS — G8929 Other chronic pain: Secondary | ICD-10-CM

## 2023-12-07 NOTE — Telephone Encounter (Signed)
 Is this okay to refill?

## 2023-12-20 ENCOUNTER — Other Ambulatory Visit: Payer: Self-pay | Admitting: Family Medicine

## 2023-12-20 DIAGNOSIS — G8929 Other chronic pain: Secondary | ICD-10-CM

## 2023-12-20 MED ORDER — METAXALONE 800 MG PO TABS: ORAL_TABLET | ORAL | 0 refills | Status: DC

## 2023-12-25 DIAGNOSIS — J329 Chronic sinusitis, unspecified: Secondary | ICD-10-CM | POA: Diagnosis not present

## 2023-12-25 DIAGNOSIS — R051 Acute cough: Secondary | ICD-10-CM | POA: Diagnosis not present

## 2023-12-25 DIAGNOSIS — J309 Allergic rhinitis, unspecified: Secondary | ICD-10-CM | POA: Diagnosis not present

## 2023-12-30 DIAGNOSIS — L570 Actinic keratosis: Secondary | ICD-10-CM | POA: Diagnosis not present

## 2023-12-30 DIAGNOSIS — L720 Epidermal cyst: Secondary | ICD-10-CM | POA: Diagnosis not present

## 2023-12-30 DIAGNOSIS — D485 Neoplasm of uncertain behavior of skin: Secondary | ICD-10-CM | POA: Diagnosis not present

## 2023-12-30 DIAGNOSIS — J309 Allergic rhinitis, unspecified: Secondary | ICD-10-CM | POA: Diagnosis not present

## 2023-12-30 DIAGNOSIS — G8929 Other chronic pain: Secondary | ICD-10-CM | POA: Diagnosis not present

## 2023-12-30 DIAGNOSIS — L821 Other seborrheic keratosis: Secondary | ICD-10-CM | POA: Diagnosis not present

## 2023-12-30 DIAGNOSIS — C44622 Squamous cell carcinoma of skin of right upper limb, including shoulder: Secondary | ICD-10-CM | POA: Diagnosis not present

## 2023-12-30 DIAGNOSIS — M25561 Pain in right knee: Secondary | ICD-10-CM | POA: Diagnosis not present

## 2023-12-30 DIAGNOSIS — J300 Vasomotor rhinitis: Secondary | ICD-10-CM | POA: Diagnosis not present

## 2023-12-30 DIAGNOSIS — D692 Other nonthrombocytopenic purpura: Secondary | ICD-10-CM | POA: Diagnosis not present

## 2023-12-30 DIAGNOSIS — M25562 Pain in left knee: Secondary | ICD-10-CM | POA: Diagnosis not present

## 2023-12-30 DIAGNOSIS — R0982 Postnasal drip: Secondary | ICD-10-CM | POA: Diagnosis not present

## 2023-12-30 DIAGNOSIS — Z85828 Personal history of other malignant neoplasm of skin: Secondary | ICD-10-CM | POA: Diagnosis not present

## 2023-12-30 DIAGNOSIS — L814 Other melanin hyperpigmentation: Secondary | ICD-10-CM | POA: Diagnosis not present

## 2024-01-05 ENCOUNTER — Encounter: Payer: Self-pay | Admitting: Family Medicine

## 2024-01-05 ENCOUNTER — Other Ambulatory Visit: Payer: Self-pay | Admitting: Family Medicine

## 2024-01-05 DIAGNOSIS — G8929 Other chronic pain: Secondary | ICD-10-CM

## 2024-01-05 DIAGNOSIS — J309 Allergic rhinitis, unspecified: Secondary | ICD-10-CM

## 2024-01-05 NOTE — Telephone Encounter (Signed)
 Last apt 06/09/23.

## 2024-02-29 NOTE — Progress Notes (Unsigned)
 New Patient Note  RE: Andrew Yang MRN: [id number] DOB: [birth year] Date of Office Visit: 03/01/2024  Consult requested by: Joyce Norleen JAYSON, MD Primary care provider: Joyce Norleen JAYSON, MD  Chief Complaint: No chief complaint on file.  History of Present Illness: I had the pleasure of seeing Andrew Yang for initial evaluation at the [facility] on 03/01/2024. He is a 73 y.o. male, who is referred here by Joyce Norleen JAYSON, MD for the evaluation of ***.  Discussed the use of AI scribe software for clinical note transcription with the patient, who gave verbal consent to proceed.  History of Present Illness             ***  Assessment and Plan: Blaiden is a 73 y.o. male with: ***  Assessment and Plan               No follow-ups on file.  No orders of the defined types were placed in this encounter.  Lab Orders  No laboratory test(s) ordered today    Other allergy screening: Asthma: {Blank single:19197::yes,no} Rhino conjunctivitis: {Blank single:19197::yes,no} Food allergy: {Blank single:19197::yes,no} Medication allergy: {Blank single:19197::yes,no} Hymenoptera allergy: {Blank single:19197::yes,no} Urticaria: {Blank single:19197::yes,no} Eczema:{Blank single:19197::yes,no} History of recurrent infections suggestive of immunodeficency: {Blank single:19197::yes,no}  Diagnostics: Spirometry:  Tracings reviewed. His effort: {Blank single:19197::Good reproducible efforts.,It was hard to get consistent efforts and there is a question as to whether this reflects a maximal maneuver.,Poor effort, data can not be interpreted.} FVC: ***L FEV1: ***L, ***% predicted FEV1/FVC ratio: ***% Interpretation: {Blank single:19197::Spirometry consistent with mild obstructive disease,Spirometry consistent with moderate obstructive disease,Spirometry consistent with severe obstructive disease,Spirometry consistent with  possible restrictive disease,Spirometry consistent with mixed obstructive and restrictive disease,Spirometry uninterpretable due to technique,Spirometry consistent with normal pattern,No overt abnormalities noted given today's efforts}.  Please see scanned spirometry results for details.  Skin Testing: {Blank single:19197::Select foods,Environmental allergy panel,Environmental allergy panel and select foods,Food allergy panel,None,Deferred due to recent antihistamines use}. *** Results discussed with patient/family.   Past Medical History: Patient Active Problem List   Diagnosis Date Noted  . Squamous cell carcinoma in situ (SCCIS) of skin of forearm 12/19/2021  . Diverticulosis of colon 11/13/2021  . Former smoker 05/27/2020  . History of colonic polyps 05/27/2020  . Atherosclerosis of aorta (HCC) 04/20/2019  . H/O dysthymia 12/05/2017  . Obstructive sleep apnea treated with continuous positive airway pressure (CPAP) 07/29/2017  . Hypersomnia with sleep apnea 03/02/2017  . ACE-inhibitor cough 04/17/2014  . Chronic back pain 04/17/2014  . Arthritis 10/03/2013  . Hypertension 10/03/2013  . ED (erectile dysfunction) 10/03/2013  . Glucose intolerance (impaired glucose tolerance) 07/26/2012  . Hepatitis C antibody test positive 04/09/2011  . Hyperlipidemia with target low density lipoprotein (LDL) cholesterol less than 100 mg/dL 91/69/7987  . Chronic allergic rhinitis 04/09/2011   Past Medical History:  Diagnosis Date  . Allergy    RHINITIS  . Colonic polyp   . Diverticulosis   . Dyslipidemia   . Hemorrhoids   . Hepatitis C    treated with interferon and Ribiviran  with negative viral loads  . Hypertension   . Pneumonia    as a child  . Sleep apnea   . Sleep apnea    CPAP  . Smoker    Past Surgical History: Past Surgical History:  Procedure Laterality Date  . ANTERIOR CRUCIATE LIGAMENT REPAIR  2006  . EYE SURGERY     cataract 2018  bil ---1/ 2019   lasik right  eye 2019  -- 2020 laser left eye removed a film growing over cataract  . SKIN BIOPSY Left    Occipital scalp shave &ED&C  basal cell carinoma, nodular pattern  . SKIN BIOPSY Right 05/24/2019   squamous cell carcinoma, keratoacanthoma type,features of regression  . SKIN BIOPSY Bilateral 05/21/2020   Hypertrophic actinic keratosis  . SKIN BIOPSY Right 12/09/2021   well differentiated squamous cell carcinoma of the fore arm  . SKIN BIOPSY Left 06/24/2023   Well differentiated squamous cell carcinoma of medial elbow  . SKIN BIOPSY Left 06/24/2023   squamous cell carcinoma in situ of forearm  . SQUAMOUS CELL CARCINOMA EXCISION Left 12/24/2022   Dr. Toribio Molt of [facility]  . UMBILICAL HERNIA REPAIR N/A 04/26/2019   Procedure: UMBILICAL HERNIA REPAIR WITH MESH;  Surgeon: Vernetta Berg, MD;  Location: [facility];  Service: General;  Laterality: N/A;   Medication List:  Current Outpatient Medications  Medication Sig Dispense Refill  . acetaminophen  (TYLENOL ) 500 MG tablet Take 1,000 mg by mouth as needed. (Patient not taking: Reported on 06/09/2023)    . Apoaequorin (PREVAGEN PO) Take by mouth.    . Ascorbic Acid (VITAMIN C) 1000 MG tablet Take 1,000 mg by mouth daily.    . Butalbital -APAP-Caffeine  50-300-40 MG CAPS Take 50 mg by mouth as needed. (Patient not taking: Reported on 06/09/2023) 30 capsule 2  . celecoxib  (CELEBREX ) 200 MG capsule TAKE 1 CAPSULE BY MOUTH TWICE A DAY 90 capsule 0  . cholecalciferol (VITAMIN D3) 25 MCG (1000 UT) tablet Take 1,000 Units by mouth daily.    . Clobetasol Propionate 0.05 % shampoo SMARTSIG:1 Topical Twice a Week PRN    . Ginkgo Biloba Extract 60 MG CAPS Take 120 mg by mouth daily.    SABRA GINSENG PO Take 2 tablets by mouth daily.    . Glucosamine-Chondroitin (GLUCOSAMINE CHONDR COMPLEX PO) Take 2 tablets by mouth daily.     . hydrOXYzine  (ATARAX /VISTARIL ) 10 MG tablet Take 1 tablet (10 mg total) by mouth 3 (three) times daily as  needed. 30 tablet 0  . latanoprost (XALATAN) 0.005 % ophthalmic solution 1 drop at bedtime.    . losartan -hydrochlorothiazide  (HYZAAR) 100-12.5 MG tablet Take 1 tablet by mouth daily. 90 tablet 3  . metaxalone  (SKELAXIN ) 800 MG tablet TAKE 1 TABLET (800 MG TOTAL)  Three times a day BY MOUTH AS NEEDED 90 tablet 0  . Multiple Vitamin (MULTIVITAMIN WITH MINERALS) TABS tablet Take 1 tablet by mouth daily.    . rosuvastatin  (CRESTOR ) 40 MG tablet Take 1 tablet (40 mg total) by mouth daily. 90 tablet 3  . tadalafil  (CIALIS ) 20 MG tablet Take 1 tablet (20 mg total) by mouth daily as needed for erectile dysfunction. 10 tablet 11  . venlafaxine  (EFFEXOR ) 75 MG tablet TAKE 1 TABLET BY MOUTH TWICE A DAY 180 tablet 0   No current facility-administered medications for this visit.   Allergies: Allergies  Allergen Reactions  . Lisinopril  Cough   Social History: Social History   Socioeconomic History  . Marital status: Married    Spouse name: Not on file  . Number of children: Not on file  . Years of education: Not on file  . Highest education level: Not on file  Occupational History  . Not on file  Tobacco Use  . Smoking status: Former    Current packs/day: 0.00    Types: Cigarettes    Quit date: 10/26/2016    Years since quitting: 7.3  . Smokeless tobacco:  Never  . Tobacco comments:    Pt states smokes cigars 6x a week   Vaping Use  . Vaping status: Never Used  Substance and Sexual Activity  . Alcohol use: Yes    Alcohol/week: 12.0 standard drinks of alcohol    Types: 12 Cans of beer per week  . Drug use: No  . Sexual activity: Yes  Other Topics Concern  . Not on file  Social History Narrative  . Not on file   Social Drivers of Health   Financial Resource Strain: Low Risk  (06/09/2023)   Overall Financial Resource Strain (CARDIA)   . Difficulty of Paying Living Expenses: Not hard at all  Food Insecurity: No Food Insecurity (06/09/2023)   Hunger Vital Sign   . Worried About  Programme researcher, broadcasting/film/video in the Last Year: Never true   . Ran Out of Food in the Last Year: Never true  Transportation Needs: No Transportation Needs (06/09/2023)   PRAPARE - Transportation   . Lack of Transportation (Medical): No   . Lack of Transportation (Non-Medical): No  Physical Activity: Insufficiently Active (06/09/2023)   Exercise Vital Sign   . Days of Exercise per Week: 6 days   . Minutes of Exercise per Session: 20 min  Stress: No Stress Concern Present (06/09/2023)   [employer] of Occupational Health - Occupational Stress Questionnaire   . Feeling of Stress : Not at all  Social Connections: Moderately Integrated (06/09/2023)   Social Connection and Isolation Panel   . Frequency of Communication with Friends and Family: More than three times a week   . Frequency of Social Gatherings with Friends and Family: More than three times a week   . Attends Religious Services: Never   . Active Member of Clubs or Organizations: Yes   . Attends Banker Meetings: More than 4 times per year   . Marital Status: Married   Lives in a ***. Smoking: *** Occupation: ***  Environmental HistorySurveyor, minerals in the house: Network engineer in the family room: {Blank single:19197::yes,no} Carpet in the bedroom: {Blank single:19197::yes,no} Heating: {Blank single:19197::electric,gas,heat pump} Cooling: {Blank single:19197::central,window,heat pump} Pet: {Blank single:19197::yes ***,no}  Family History: Family History  Problem Relation Age of Onset  . Arthritis Mother   . Hypertension Father   . Heart disease Maternal Grandfather   . Sleep apnea Neg Hx    Problem                               Relation Asthma                                   *** Eczema                                *** Food allergy                          *** Allergic rhino conjunctivitis     ***  Review of Systems  Constitutional:  Negative for  appetite change, chills, fever and unexpected weight change.  HENT:  Negative for congestion and rhinorrhea.   Eyes:  Negative for itching.  Respiratory:  Negative for cough, chest tightness, shortness of breath and wheezing.   Cardiovascular:  Negative for chest pain.  Gastrointestinal:  Negative for abdominal pain.  Genitourinary:  Negative for difficulty urinating.  Skin:  Negative for rash.  Neurological:  Negative for headaches.    Objective: There were no vitals taken for this visit. There is no height or weight on file to calculate BMI. Physical Exam Vitals and nursing note reviewed.  Constitutional:      Appearance: Normal appearance. He is well-developed.  HENT:     Head: Normocephalic and atraumatic.     Right Ear: Tympanic membrane and external ear normal.     Left Ear: Tympanic membrane and external ear normal.     Nose: Nose normal.     Mouth/Throat:     Mouth: Mucous membranes are moist.     Pharynx: Oropharynx is clear.  Eyes:     Conjunctiva/sclera: Conjunctivae normal.  Cardiovascular:     Rate and Rhythm: Normal rate and regular rhythm.     Heart sounds: Normal heart sounds. No murmur heard.    No friction rub. No gallop.  Pulmonary:     Effort: Pulmonary effort is normal.     Breath sounds: Normal breath sounds. No wheezing, rhonchi or rales.  Musculoskeletal:     Cervical back: Neck supple.  Skin:    General: Skin is warm.     Findings: No rash.  Neurological:     Mental Status: He is alert and oriented to person, place, and time.  Psychiatric:        Behavior: Behavior normal.   The plan was reviewed with the patient/family, and all questions/concerned were addressed.  It was my pleasure to see Higinio today and participate in his care. Please feel free to contact me with any questions or concerns.  Sincerely,  Orlan Cramp, DO Allergy & Immunology  [facility]  [facility] office: [phone] [facility] office:  [phone]

## 2024-03-01 ENCOUNTER — Encounter: Payer: Self-pay | Admitting: Allergy

## 2024-03-01 ENCOUNTER — Other Ambulatory Visit: Payer: Self-pay

## 2024-03-01 ENCOUNTER — Ambulatory Visit: Admitting: Allergy

## 2024-03-01 VITALS — BP 122/102 | HR 83 | Temp 98.3°F | Resp 16 | Ht 72.0 in | Wt 205.8 lb

## 2024-03-01 DIAGNOSIS — J300 Vasomotor rhinitis: Secondary | ICD-10-CM | POA: Diagnosis not present

## 2024-03-01 DIAGNOSIS — J310 Chronic rhinitis: Secondary | ICD-10-CM

## 2024-03-01 DIAGNOSIS — R12 Heartburn: Secondary | ICD-10-CM | POA: Diagnosis not present

## 2024-03-01 NOTE — Patient Instructions (Addendum)
 Rhinitis  Do NOT take the OTC decongestant medications every morning as it can cause rebound symptoms and elevated blood pressure.  Try to wean off this medication and start taking below nasal sprays as needed.   Use Atrovent (ipratropium) 0.03% 1-2 sprays per nostril twice a day as needed for runny nose/drainage. Use azelastine nasal spray 1-2 sprays per nostril twice a day as needed for runny nose/drainage. Use Flonase (fluticasone) nasal spray 1-2 sprays per nostril once a day as needed for nasal congestion.  Nasal saline spray (i.e., Simply Saline) or nasal saline lavage (i.e., NeilMed) is recommended as needed and prior to medicated nasal sprays. Use over the counter antihistamines such as Zyrtec (cetirizine), Claritin (loratadine), Allegra (fexofenadine), or Xyzal (levocetirizine) daily as needed.  Take the ones without the decongestants!  Skin testing instructions Will make additional recommendations based on results. Make sure you don't take any antihistamines for 3 days before the skin testing appointment. Don't put any lotion on the back and arms on the day of testing.  Must be in good health and not ill. No vaccines/injections/antibiotics within the past 7 days.  Plan on being here for 30-60 minutes.  Heartburn See handout for lifestyle and dietary modifications.  Follow up as needed.   Control of House Dust Mite Allergen Dust mite allergens are a common trigger of allergy and asthma symptoms. While they can be found throughout the house, these microscopic creatures thrive in warm, humid environments such as bedding, upholstered furniture and carpeting. Because so much time is spent in the bedroom, it is essential to reduce mite levels there.  Encase pillows, mattresses, and box springs in special allergen-proof fabric covers or airtight, zippered plastic covers.  Bedding should be washed weekly in hot water (130 F) and dried in a hot dryer. Allergen-proof covers are available  for comforters and pillows that can't be regularly washed.  Wash the allergy-proof covers every few months. Minimize clutter in the bedroom. Keep pets out of the bedroom.  Keep humidity less than 50% by using a dehumidifier or air conditioning. You can buy a humidity measuring device called a hygrometer to monitor this.  If possible, replace carpets with hardwood, linoleum, or washable area rugs. If that's not possible, vacuum frequently with a vacuum that has a HEPA filter. Remove all upholstered furniture and non-washable window drapes from the bedroom. Remove all non-washable stuffed toys from the bedroom.  Wash stuffed toys weekly.  Pet Allergen Avoidance: Contrary to popular opinion, there are no "hypoallergenic" breeds of dogs or cats. That is because people are not allergic to an animal's hair, but to an allergen found in the animal's saliva, dander (dead skin flakes) or urine. Pet allergy symptoms typically occur within minutes. For some people, symptoms can build up and become most severe 8 to 12 hours after contact with the animal. People with severe allergies can experience reactions in public places if dander has been transported on the pet owners' clothing. Keeping an animal outdoors is only a partial solution, since homes with pets in the yard still have higher concentrations of animal allergens. Before getting a pet, ask your allergist to determine if you are allergic to animals. If your pet is already considered part of your family, try to minimize contact and keep the pet out of the bedroom and other rooms where you spend a great deal of time. As with dust mites, vacuum carpets often or replace carpet with a hardwood floor, tile or linoleum. High-efficiency particulate air (HEPA) cleaners  can reduce allergen levels over time. While dander and saliva are the source of cat and dog allergens, urine is the source of allergens from rabbits, hamsters, mice and israel pigs; so ask a  non-allergic family member to clean the animal's cage. If you have a pet allergy, talk to your allergist about the potential for allergy immunotherapy (allergy shots). This strategy can often provide long-term relief.

## 2024-03-14 ENCOUNTER — Ambulatory Visit

## 2024-03-14 DIAGNOSIS — Z0000 Encounter for general adult medical examination without abnormal findings: Secondary | ICD-10-CM

## 2024-03-14 NOTE — Progress Notes (Signed)
 Subjective:   Andrew Yang is a 73 y.o. who presents for a Medicare Wellness preventive visit.  As a reminder, Annual Wellness Visits don't include a physical exam, and some assessments may be limited, especially if this visit is performed virtually. We may recommend an in-person follow-up visit with your provider if needed.  Visit Complete: Virtual I connected with  Andrew Yang on 03/14/24 by a audio enabled telemedicine application and verified that I am speaking with the correct person using two identifiers.  Patient Location: Home  Provider Location: Office/Clinic  I discussed the limitations of evaluation and management by telemedicine. The patient expressed understanding and agreed to proceed.  Vital Signs: Because this visit was a virtual/telehealth visit, some criteria may be missing or patient reported. Any vitals not documented were not able to be obtained and vitals that have been documented are patient reported.  VideoDeclined- This patient declined Librarian, academic. Therefore the visit was completed with audio only.  Persons Participating in Visit: Patient.  AWV Questionnaire: No: Patient Medicare AWV questionnaire was not completed prior to this visit.  Cardiac Risk Factors include: advanced age (>87men, >79 women);dyslipidemia;hypertension;male gender;smoking/ tobacco exposure     Objective:    There were no vitals filed for this visit. There is no height or weight on file to calculate BMI.     03/14/2024    8:24 AM 03/23/2023    8:14 AM 06/02/2022    3:15 PM 05/28/2021    3:29 PM 05/27/2020    3:01 PM 04/19/2019   10:38 AM 12/13/2018    8:35 AM  Advanced Directives  Does Patient Have a Medical Advance Directive? Yes Yes Yes Yes Yes Yes Yes  Type of Estate agent of [place];Living will Healthcare Power of [place];Living will Healthcare Power of [place];Living will;Out of facility DNR (pink MOST or yellow  form) Living will;Healthcare Power of [employer] of [place];Living will Healthcare Power of [place];Living will Healthcare Power of [place];Living will  Does patient want to make changes to medical advance directive? No - Patient declined  No - Patient declined No - Patient declined  No - Patient declined   Copy of Healthcare Power of Attorney in Chart? Yes - validated most recent copy scanned in chart (See row information) Yes - validated most recent copy scanned in chart (See row information) Yes - validated most recent copy scanned in chart (See row information) Yes - validated most recent copy scanned in chart (See row information) Yes - validated most recent copy scanned in chart (See row information) No - copy requested Yes - validated most recent copy scanned in chart (See row information)      Data saved with a previous flowsheet row definition    Current Medications (verified) Outpatient Encounter Medications as of 03/14/2024  Medication Sig   Apoaequorin (PREVAGEN PO) Take by mouth.   Ascorbic Acid (VITAMIN C) 1000 MG tablet Take 1,000 mg by mouth daily.   Azelastine HCl 137 MCG/SPRAY SOLN Place into both nostrils.   Butalbital -APAP-Caffeine  50-300-40 MG CAPS Take 50 mg by mouth as needed.   celecoxib  (CELEBREX ) 200 MG capsule TAKE 1 CAPSULE BY MOUTH TWICE A DAY   cholecalciferol (VITAMIN D3) 25 MCG (1000 UT) tablet Take 1,000 Units by mouth daily.   Clobetasol Propionate 0.05 % shampoo SMARTSIG:1 Topical Twice a Week PRN   Ginkgo Biloba Extract 60 MG CAPS Take 120 mg by mouth daily.   GINSENG PO Take 2 tablets by mouth  daily.   Glucosamine-Chondroitin (GLUCOSAMINE CHONDR COMPLEX PO) Take 2 tablets by mouth daily.    latanoprost (XALATAN) 0.005 % ophthalmic solution 1 drop at bedtime.   losartan -hydrochlorothiazide  (HYZAAR) 100-12.5 MG tablet Take 1 tablet by mouth daily.   metaxalone  (SKELAXIN ) 800 MG tablet TAKE 1 TABLET (800 MG TOTAL)  Three times a day BY MOUTH AS  NEEDED   Multiple Vitamin (MULTIVITAMIN WITH MINERALS) TABS tablet Take 1 tablet by mouth daily.   rosuvastatin  (CRESTOR ) 40 MG tablet Take 1 tablet (40 mg total) by mouth daily.   tadalafil  (CIALIS ) 20 MG tablet Take 1 tablet (20 mg total) by mouth daily as needed for erectile dysfunction.   venlafaxine  (EFFEXOR ) 75 MG tablet TAKE 1 TABLET BY MOUTH TWICE A DAY   No facility-administered encounter medications on file as of 03/14/2024.    Allergies (verified) Lisinopril    History: Past Medical History:  Diagnosis Date   Allergy    RHINITIS   Colonic polyp    Diverticulosis    Dyslipidemia    Hemorrhoids    Hepatitis C    treated with interferon and Ribiviran  with negative viral loads   Hypertension    Pneumonia    as a child   Sleep apnea    Sleep apnea    CPAP   Smoker    Past Surgical History:  Procedure Laterality Date   ANTERIOR CRUCIATE LIGAMENT REPAIR  2006   EYE SURGERY     cataract 2018  bil ---1/ 2019  lasik right eye 2019  -- 2020 laser left eye removed a film growing over cataract   SKIN BIOPSY Left    Occipital scalp shave &ED&C  basal cell carinoma, nodular pattern   SKIN BIOPSY Right 05/24/2019   squamous cell carcinoma, keratoacanthoma type,features of regression   SKIN BIOPSY Bilateral 05/21/2020   Hypertrophic actinic keratosis   SKIN BIOPSY Right 12/09/2021   well differentiated squamous cell carcinoma of the fore arm   SKIN BIOPSY Left 06/24/2023   Well differentiated squamous cell carcinoma of medial elbow   SKIN BIOPSY Left 06/24/2023   squamous cell carcinoma in situ of forearm   SQUAMOUS CELL CARCINOMA EXCISION Left 12/24/2022   Dr. Toribio Molt of [facility]   UMBILICAL HERNIA REPAIR N/A 04/26/2019   Procedure: UMBILICAL HERNIA REPAIR WITH MESH;  Surgeon: Vernetta Berg, MD;  Location: [facility];  Service: General;  Laterality: N/A;   Family History  Problem Relation Age of Onset   Arthritis Mother    Hypertension Father     Heart disease Maternal Grandfather    Sleep apnea Neg Hx    Social History   Socioeconomic History   Marital status: Married    Spouse name: Not on file   Number of children: Not on file   Years of education: Not on file   Highest education level: Professional school degree (e.g., MD, DDS, DVM, JD)  Occupational History   Not on file  Tobacco Use   Smoking status: Former    Current packs/day: 0.00    Types: Cigarettes    Quit date: 10/26/2016    Years since quitting: 7.3   Smokeless tobacco: Never   Tobacco comments:    Pt states smokes cigars 6x a week   Vaping Use   Vaping status: Never Used  Substance and Sexual Activity   Alcohol use: Yes    Alcohol/week: 12.0 standard drinks of alcohol    Types: 12 Cans of beer per week   Drug use:  No   Sexual activity: Yes  Other Topics Concern   Not on file  Social History Narrative   Not on file   Social Drivers of Health   Financial Resource Strain: Low Risk  (03/14/2024)   Overall Financial Resource Strain (CARDIA)    Difficulty of Paying Living Expenses: Not hard at all  Food Insecurity: No Food Insecurity (03/14/2024)   Hunger Vital Sign    Worried About Running Out of Food in the Last Year: Never true    Ran Out of Food in the Last Year: Never true  Transportation Needs: No Transportation Needs (03/14/2024)   PRAPARE - Administrator, Civil Service (Medical): No    Lack of Transportation (Non-Medical): No  Physical Activity: Insufficiently Active (03/14/2024)   Exercise Vital Sign    Days of Exercise per Week: 4 days    Minutes of Exercise per Session: 30 min  Stress: No Stress Concern Present (03/14/2024)   [employer] of Occupational Health - Occupational Stress Questionnaire    Feeling of Stress: Not at all  Social Connections: Moderately Integrated (03/14/2024)   Social Connection and Isolation Panel    Frequency of Communication with Friends and Family: More than three times a week    Frequency of Social  Gatherings with Friends and Family: Twice a week    Attends Religious Services: Never    Database administrator or Organizations: Yes    Attends Engineer, structural: More than 4 times per year    Marital Status: Married    Tobacco Counseling Counseling given: Not Answered Tobacco comments: Pt states smokes cigars 6x a week     Clinical Intake:  Pre-visit preparation completed: Yes  Pain : No/denies pain     Nutritional Risks: None Diabetes: No  Lab Results  Component Value Date   HGBA1C 5.7 (A) 05/28/2021   HGBA1C 4.4 12/13/2018   HGBA1C 5.9 (H) 11/02/2017     How often do you need to have someone help you when you read instructions, pamphlets, or other written materials from your doctor or pharmacy?: 1 - Never  Interpreter Needed?: No  Information entered by :: JHONNIE DAS, LPN   Activities of Daily Living    03/14/2024    8:26 AM 03/13/2024   10:38 AM  In your present state of health, do you have any difficulty performing the following activities:  Hearing? 0 0  Vision? 0 0  Difficulty concentrating or making decisions? 0 0  Walking or climbing stairs? 0 0  Dressing or bathing? 0 0  Doing errands, shopping? 0 0  Preparing Food and eating ? N N  Using the Toilet? N N  In the past six months, have you accidently leaked urine? Y Y  Do you have problems with loss of bowel control? Y Y  Managing your Medications? N   Managing your Finances? N N  Housekeeping or managing your Housekeeping? N N    Patient Care Team: Joyce Norleen BROCKS, MD as PCP - General (Family Medicine) Robinson Mayo, OD as Referring Physician (Optometry)  I have updated your Care Teams any recent Medical Services you may have received from other providers in the past year.     Assessment:   This is a routine wellness examination for Andrew Yang.  Hearing/Vision screen Hearing Screening - Comments:: NO AIDS Vision Screening - Comments:: READERS, WORKING- [facility].- DR.GOULD-  HAS APPT NEXT MONTH, HAS CATARACTS   Goals Addressed  This Visit's Progress    DIET - EAT MORE FRUITS AND VEGETABLES         Depression Screen     03/14/2024    8:23 AM 03/23/2023    8:17 AM 06/02/2022    3:12 PM 08/28/2021   11:59 AM 05/28/2021    3:31 PM 12/10/2020    1:37 PM 09/03/2020   10:52 AM  PHQ 2/9 Scores  PHQ - 2 Score 0 0 0 0 0 0 0  PHQ- 9 Score 0 0 0 0  0     Fall Risk     03/13/2024   10:38 AM 03/22/2023   10:44 AM 06/02/2022    3:12 PM 05/28/2021    3:30 PM 05/27/2020    3:03 PM  Fall Risk   Falls in the past year? 0 1 0 0 0  Comment  had too much wine     Number falls in past yr: 0 1 0 0   Injury with Fall? 0 1 0 0   Risk for fall due to : No Fall Risks History of fall(s);Medication side effect No Fall Risks No Fall Risks   Follow up Falls evaluation completed;Falls prevention discussed Falls prevention discussed;Falls evaluation completed Falls evaluation completed  Falls evaluation completed       Data saved with a previous flowsheet row definition    MEDICARE RISK AT HOME:  Medicare Risk at Home Any stairs in or around the home?: Yes If so, are there any without handrails?: No Home free of loose throw rugs in walkways, pet beds, electrical cords, etc?: Yes Adequate lighting in your home to reduce risk of falls?: Yes Life alert?: No Use of a cane, walker or w/c?: No Grab bars in the bathroom?: No Shower chair or bench in shower?: No Elevated toilet seat or a handicapped toilet?: No  TIMED UP AND GO:  Was the test performed?  No  Cognitive Function: 6CIT completed        03/14/2024    8:26 AM 03/23/2023    8:17 AM  6CIT Screen  What Year? 0 points 0 points  What month? 0 points 0 points  What time? 0 points 0 points  Count back from 20 0 points 0 points  Months in reverse 0 points 0 points  Repeat phrase 0 points 0 points  Total Score 0 points 0 points    Immunizations Immunization History  Administered Date(s)  Administered   Fluad Quad(high Dose 65+) 04/18/2019, 05/09/2020, 05/20/2021, 05/11/2022   Hepatitis A 10/23/1999, 05/05/2000   Hepatitis B 10/13/1999, 12/24/1999, 05/05/2000   IPV 04/26/2008   Influenza Whole 07/25/2007, 04/09/2011   Influenza, High Dose Seasonal PF 07/21/2016, 06/04/2017, 04/25/2018, 05/15/2023   Influenza, Seasonal, Injecte, Preservative Fre 07/26/2012   Influenza,inj,Quad PF,6+ Mos 04/17/2014, 06/13/2015   MMR 07/15/2007   Meningococcal Conjugate 04/26/2008   PFIZER(Purple Top)SARS-COV-2 Vaccination 09/16/2019, 10/11/2019, 05/09/2020   Pfizer Covid-19 Vaccine Bivalent Booster 57yrs & up 05/20/2021   Pfizer(Comirnaty)Fall Seasonal Vaccine 12 years and older 05/15/2023   Pneumococcal Conjugate-13 10/26/2016   Pneumococcal Polysaccharide-23 11/15/2017   Respiratory Syncytial Virus Vaccine,Recomb Aduvanted(Arexvy) 05/11/2022   Td 01/05/1984, 05/16/2004, 04/26/2008   Td (Adult), 2 Lf Tetanus Toxid, Preservative Free 01/05/1984, 05/16/2004, 04/26/2008   Tdap 04/26/2008, 07/26/2012, 12/17/2019   Typhoid Inactivated 04/26/2008   Unspecified SARS-COV-2 Vaccination 05/11/2022   Yellow Fever 04/26/2008   Zoster Recombinant(Shingrix) 10/27/2017, 05/23/2019, 05/23/2019   Zoster, Live 07/26/2012    Screening Tests Health Maintenance  Topic Date Due  COVID-19 Vaccine (7 - Pfizer risk 2024-25 season) 11/13/2023   INFLUENZA VACCINE  03/10/2024   Medicare Annual Wellness (AWV)  03/14/2025   DTaP/Tdap/Td (10 - Td or Tdap) 12/16/2029   Colonoscopy  11/12/2031   Pneumococcal Vaccine: 50+ Years  Completed   Hepatitis B Vaccines  Completed   Hepatitis C Screening  Completed   Zoster Vaccines- Shingrix  Completed   HPV VACCINES  Aged Out   Meningococcal B Vaccine  Aged Out    Health Maintenance  Health Maintenance Due  Topic Date Due   COVID-19 Vaccine (7 - Pfizer risk 2024-25 season) 11/13/2023   INFLUENZA VACCINE  03/10/2024   Health Maintenance Items Addressed: UP  TO DATE ON COLONOSCOPY; UP TO DATE ON SHOTS   Additional Screening:  Vision Screening: Recommended annual ophthalmology exams for early detection of glaucoma and other disorders of the eye. Would you like a referral to an eye doctor? No    Dental Screening: Recommended annual dental exams for proper oral hygiene  Community Resource Referral / Chronic Care Management: CRR required this visit?  No   CCM required this visit?  No   Plan:    I have personally reviewed and noted the following in the patient's chart:   Medical and social history Use of alcohol, tobacco or illicit drugs  Current medications and supplements including opioid prescriptions. Patient is not currently taking opioid prescriptions. Functional ability and status Nutritional status Physical activity Advanced directives List of other physicians Hospitalizations, surgeries, and ER visits in previous 12 months Vitals Screenings to include cognitive, depression, and falls Referrals and appointments  In addition, I have reviewed and discussed with patient certain preventive protocols, quality metrics, and best practice recommendations. A written personalized care plan for preventive services as well as general preventive health recommendations were provided to patient.   Jhonnie GORMAN Das, LPN   08/13/7972   After Visit Summary: (MyChart) Due to this being a telephonic visit, the after visit summary with patients personalized plan was offered to patient via MyChart   Notes: Nothing significant to report at this time.

## 2024-03-14 NOTE — Patient Instructions (Addendum)
 Andrew Yang , Thank you for taking time out of your busy schedule to complete your Annual Wellness Visit with me. I enjoyed our conversation and look forward to speaking with you again next year. I, as well as your care team,  appreciate your ongoing commitment to your health goals. Please review the following plan we discussed and let me know if I can assist you in the future.   Follow up Visits: 03/20/25 @ 8:10 AM We will see or speak with you next year for your Next Medicare AWV with our clinical staff Have you seen your provider in the last 6 months (3 months if uncontrolled diabetes)? Yes  Clinician Recommendations:  Aim for 30 minutes of exercise or brisk walking, 6-8 glasses of water, and 5 servings of fruits and vegetables each day. TAKE CARE!      This is a list of the screenings recommended for you:  Health Maintenance  Topic Date Due   COVID-19 Vaccine (7 - Pfizer risk 2024-25 season) 11/13/2023   Flu Shot  03/10/2024   Medicare Annual Wellness Visit  03/14/2025   DTaP/Tdap/Td vaccine (10 - Td or Tdap) 12/16/2029   Colon Cancer Screening  11/12/2031   Pneumococcal Vaccine for age over 74  Completed   Hepatitis B Vaccine  Completed   Hepatitis C Screening  Completed   Zoster (Shingles) Vaccine  Completed   HPV Vaccine  Aged Out   Meningitis B Vaccine  Aged Out    Advanced directives: (In Chart) A copy of your advanced directives are scanned into your chart should your provider ever need it. Advance Care Planning is important because it:  [x]  Makes sure you receive the medical care that is consistent with your values, goals, and preferences  [x]  It provides guidance to your family and loved ones and reduces their decisional burden about whether or not they are making the right decisions based on your wishes.  Follow the link provided in your after visit summary or read over the paperwork we have mailed to you to help you started getting your Advance Directives in place. If you  need assistance in completing these, please reach out to us  so that we can help you!

## 2024-04-02 ENCOUNTER — Other Ambulatory Visit: Payer: Self-pay | Admitting: Medical

## 2024-04-02 DIAGNOSIS — F341 Dysthymic disorder: Secondary | ICD-10-CM

## 2024-04-03 ENCOUNTER — Encounter: Payer: Self-pay | Admitting: Family Medicine

## 2024-04-04 DIAGNOSIS — H26491 Other secondary cataract, right eye: Secondary | ICD-10-CM | POA: Diagnosis not present

## 2024-04-04 DIAGNOSIS — Z961 Presence of intraocular lens: Secondary | ICD-10-CM | POA: Diagnosis not present

## 2024-04-04 DIAGNOSIS — H401131 Primary open-angle glaucoma, bilateral, mild stage: Secondary | ICD-10-CM | POA: Diagnosis not present

## 2024-04-05 NOTE — Telephone Encounter (Signed)
 Left voicemail for patient

## 2024-04-12 DIAGNOSIS — H5211 Myopia, right eye: Secondary | ICD-10-CM | POA: Diagnosis not present

## 2024-04-12 DIAGNOSIS — H5202 Hypermetropia, left eye: Secondary | ICD-10-CM | POA: Diagnosis not present

## 2024-04-12 DIAGNOSIS — H401131 Primary open-angle glaucoma, bilateral, mild stage: Secondary | ICD-10-CM | POA: Diagnosis not present

## 2024-06-06 ENCOUNTER — Other Ambulatory Visit: Payer: Medicare HMO

## 2024-06-06 DIAGNOSIS — Z125 Encounter for screening for malignant neoplasm of prostate: Secondary | ICD-10-CM

## 2024-06-06 DIAGNOSIS — R7689 Other specified abnormal immunological findings in serum: Secondary | ICD-10-CM

## 2024-06-06 DIAGNOSIS — E785 Hyperlipidemia, unspecified: Secondary | ICD-10-CM

## 2024-06-06 DIAGNOSIS — Z0000 Encounter for general adult medical examination without abnormal findings: Secondary | ICD-10-CM

## 2024-06-07 LAB — HCV AB W REFLEX TO QUANT PCR: HCV Ab: REACTIVE — AB

## 2024-06-07 LAB — CBC WITH DIFFERENTIAL/PLATELET
Basophils Absolute: 0.1 x10E3/uL (ref 0.0–0.2)
Basos: 2 %
EOS (ABSOLUTE): 0.2 x10E3/uL (ref 0.0–0.4)
Eos: 3 %
Hematocrit: 45.2 % (ref 37.5–51.0)
Hemoglobin: 15 g/dL (ref 13.0–17.7)
Immature Grans (Abs): 0 x10E3/uL (ref 0.0–0.1)
Immature Granulocytes: 0 %
Lymphocytes Absolute: 1.7 x10E3/uL (ref 0.7–3.1)
Lymphs: 35 %
MCH: 31.1 pg (ref 26.6–33.0)
MCHC: 33.2 g/dL (ref 31.5–35.7)
MCV: 94 fL (ref 79–97)
Monocytes Absolute: 0.5 x10E3/uL (ref 0.1–0.9)
Monocytes: 11 %
Neutrophils Absolute: 2.4 x10E3/uL (ref 1.4–7.0)
Neutrophils: 49 %
Platelets: 217 x10E3/uL (ref 150–450)
RBC: 4.83 x10E6/uL (ref 4.14–5.80)
RDW: 13.1 % (ref 11.6–15.4)
WBC: 4.9 x10E3/uL (ref 3.4–10.8)

## 2024-06-07 LAB — CMP14+EGFR
ALT: 23 IU/L (ref 0–44)
AST: 23 IU/L (ref 0–40)
Albumin: 4 g/dL (ref 3.8–4.8)
Alkaline Phosphatase: 66 IU/L (ref 47–123)
BUN/Creatinine Ratio: 16 (ref 10–24)
BUN: 14 mg/dL (ref 8–27)
Bilirubin Total: 0.3 mg/dL (ref 0.0–1.2)
CO2: 22 mmol/L (ref 20–29)
Calcium: 8.8 mg/dL (ref 8.6–10.2)
Chloride: 104 mmol/L (ref 96–106)
Creatinine, Ser: 0.88 mg/dL (ref 0.76–1.27)
Globulin, Total: 2.1 g/dL (ref 1.5–4.5)
Glucose: 128 mg/dL — ABNORMAL HIGH (ref 70–99)
Potassium: 4.3 mmol/L (ref 3.5–5.2)
Sodium: 141 mmol/L (ref 134–144)
Total Protein: 6.1 g/dL (ref 6.0–8.5)
eGFR: 91 mL/min/1.73 (ref >59)

## 2024-06-07 LAB — LIPID PANEL
Chol/HDL Ratio: 4.7 ratio (ref 0.0–5.0)
Cholesterol, Total: 184 mg/dL (ref 100–199)
HDL: 39 mg/dL — ABNORMAL LOW (ref >39)
LDL Chol Calc (NIH): 114 mg/dL — ABNORMAL HIGH (ref 0–99)
Triglycerides: 174 mg/dL — ABNORMAL HIGH (ref 0–149)
VLDL Cholesterol Cal: 31 mg/dL (ref 5–40)

## 2024-06-07 LAB — HCV RT-PCR, QUANT (NON-GRAPH): Hepatitis C Quantitation: NOT DETECTED [IU]/mL

## 2024-06-07 LAB — TSH: TSH: 4.56 u[IU]/mL — AB (ref 0.450–4.500)

## 2024-06-07 LAB — HEMOGLOBIN A1C
Est. average glucose Bld gHb Est-mCnc: 123 mg/dL
Hgb A1c MFr Bld: 5.9 % — ABNORMAL HIGH (ref 4.8–5.6)

## 2024-06-07 LAB — PSA: Prostate Specific Ag, Serum: 0.4 ng/mL (ref 0.0–4.0)

## 2024-06-08 ENCOUNTER — Telehealth: Payer: Self-pay | Admitting: Adult Health

## 2024-06-08 NOTE — Telephone Encounter (Signed)
 Myhcart needs to r/s Pty has Medicare no Mychart visit covered.

## 2024-06-08 NOTE — Telephone Encounter (Signed)
 Patient called to reschedule Mychart Visit due to insurance will not cover MyChart Video Visit.

## 2024-06-09 ENCOUNTER — Other Ambulatory Visit: Payer: Self-pay | Admitting: Family Medicine

## 2024-06-09 DIAGNOSIS — I7090 Unspecified atherosclerosis: Secondary | ICD-10-CM

## 2024-06-09 DIAGNOSIS — E785 Hyperlipidemia, unspecified: Secondary | ICD-10-CM

## 2024-06-09 DIAGNOSIS — I10 Essential (primary) hypertension: Secondary | ICD-10-CM

## 2024-06-09 DIAGNOSIS — G8929 Other chronic pain: Secondary | ICD-10-CM

## 2024-06-14 ENCOUNTER — Encounter: Payer: Self-pay | Admitting: Family Medicine

## 2024-06-14 ENCOUNTER — Ambulatory Visit (INDEPENDENT_AMBULATORY_CARE_PROVIDER_SITE_OTHER): Payer: Medicare HMO | Admitting: Family Medicine

## 2024-06-14 VITALS — BP 138/80 | HR 69 | Ht 72.0 in | Wt 212.4 lb

## 2024-06-14 DIAGNOSIS — M545 Low back pain, unspecified: Secondary | ICD-10-CM | POA: Diagnosis not present

## 2024-06-14 DIAGNOSIS — G8929 Other chronic pain: Secondary | ICD-10-CM

## 2024-06-14 DIAGNOSIS — G4730 Sleep apnea, unspecified: Secondary | ICD-10-CM | POA: Diagnosis not present

## 2024-06-14 DIAGNOSIS — N5201 Erectile dysfunction due to arterial insufficiency: Secondary | ICD-10-CM

## 2024-06-14 DIAGNOSIS — I10 Essential (primary) hypertension: Secondary | ICD-10-CM | POA: Diagnosis not present

## 2024-06-14 DIAGNOSIS — Z0000 Encounter for general adult medical examination without abnormal findings: Secondary | ICD-10-CM | POA: Diagnosis not present

## 2024-06-14 DIAGNOSIS — Z860100 Personal history of colon polyps, unspecified: Secondary | ICD-10-CM

## 2024-06-14 DIAGNOSIS — M1990 Unspecified osteoarthritis, unspecified site: Secondary | ICD-10-CM | POA: Diagnosis not present

## 2024-06-14 DIAGNOSIS — R7303 Prediabetes: Secondary | ICD-10-CM | POA: Diagnosis not present

## 2024-06-14 DIAGNOSIS — E785 Hyperlipidemia, unspecified: Secondary | ICD-10-CM

## 2024-06-14 DIAGNOSIS — Z23 Encounter for immunization: Secondary | ICD-10-CM

## 2024-06-14 DIAGNOSIS — I700 Atherosclerosis of aorta: Secondary | ICD-10-CM

## 2024-06-14 DIAGNOSIS — G4710 Hypersomnia, unspecified: Secondary | ICD-10-CM | POA: Diagnosis not present

## 2024-06-14 DIAGNOSIS — G4733 Obstructive sleep apnea (adult) (pediatric): Secondary | ICD-10-CM

## 2024-06-14 DIAGNOSIS — J309 Allergic rhinitis, unspecified: Secondary | ICD-10-CM

## 2024-06-14 DIAGNOSIS — Z8659 Personal history of other mental and behavioral disorders: Secondary | ICD-10-CM

## 2024-06-14 MED ORDER — EZETIMIBE 10 MG PO TABS
10.0000 mg | ORAL_TABLET | Freq: Every day | ORAL | 3 refills | Status: AC
Start: 2024-06-14 — End: ?

## 2024-06-14 NOTE — Progress Notes (Signed)
 Subjective:    Patient ID: Andrew Yang, male    DOB: November 14, 1950, 73 y.o.   MRN: [id number]  Discussed the use of AI scribe software for clinical note transcription with the patient, who gave verbal consent to proceed.  History of Present Illness   Andrew Yang is a 73 year old male with atherosclerosis who presents for evaluation of coronary calcium  score and other health concerns.  He is interested in obtaining a coronary calcium  score and reports taking Crestor . He recalls a previous CT scan for lung cancer screening because of his history as a former smoker. He has a family history of heart disease, with his father having had a heart attack.  He reports a recent glucose level of 128 mg/dL, which he attributes to having consumed coffee with cream before the test. His hemoglobin A1c is 5.9%, placing him in the prediabetes range. His glucose levels have typically been between 95 to 105 mg/dL.  He has a history of hepatitis C, with a current undetectable viral load. An HCV antibody test was performed, which was expected to be positive due to his past infection.  He has a history of colonic polyps, with the last colonoscopy performed in O2 or O3.  He uses a CPAP machine for sleep apnea, noting that he sleeps poorly without it.  He experiences vasomotor rhinitis, particularly triggered by temperature changes, and has been prescribed medication for it. An allergist confirmed he does not have any significant allergies.  He is currently taking Effexor  for mood stabilization and reports doing well on it.  He also takes Crestor  for cholesterol management and monitors his cholesterol levels regularly, with recent results showing a total cholesterol of 184 mg/dL, triglycerides of 825 mg/dL, and LDL slightly higher than desired.  He has consistently low PSA levels, with the most recent being 0.4 ng/mL. He has no immediate family history of prostate cancer.     He has a history of low back pain  and has had intermittent difficulty using a muscle relaxer for that with good results.   He continues on losartan /HCTZ for his blood pressure. He also continues on Effexor  which helps keep him psychologically in a good space and has no desire to quit taking that medication. His wife has multiple myeloma and he seems to be handling this fairly well. Review of Systems     Objective:    Physical Exam Alert and in no distress. Tympanic membranes and canals are normal. Pharyngeal area is normal. Neck is supple without adenopathy or thyromegaly. Cardiac exam shows a regular sinus rhythm without murmurs or gallops. Lungs are clear to auscultation.              Assessment & Plan:     Routine general medical examination at a health care facilit Coronary calcium  score deemed worthwhile. Carotid artery disease screening not recommended. - Ordered coronary calcium  score CT scan.  Hyperlipidemia and atherosclerosis of aorta LDL slightly elevated at 14. Discussed adding Zetia to lower cholesterol and triglycerides. Coronary calcium  score will guide further management. - Added Zetia to current medication regimen. - Ordered coronary calcium  score CT scan.  Essential hypertension Hypertension managed with current medication regimen.  Prediabetes Hemoglobin A1c at 5.9. Emphasized diet and exercise for management. - Continue monitoring blood glucose levels. - Encouraged diet and exercise modifications.  Obstructive sleep apnea Managed with CPAP. Reports occasional issues with CPAP usage affecting sleep quality. - Continue CPAP therapy.  Unspecified osteoarthritis and low back pain  Managed with occasional muscle relaxers.  Vasomotor rhinitis Managed with medication prescribed by ENT. Symptoms triggered by temperature changes. - Continue current medication for vasomotor rhinitis.       Atherosclerosis of aorta - Plan: CT CARDIAC SCORING (SELF PAY ONLY), ezetimibe (ZETIA) 10 MG  tablet  Chronic allergic rhinitis  Chronic midline low back pain without sciatica   H/O dysthymia  History of colonic polyps  Hyperlipidemia with target low density lipoprotein (LDL) cholesterol less than 100 mg/dL   Need for vaccination against Streptococcus pneumoniae - Plan: Pneumococcal conjugate vaccine 20-valent (Prevnar 20)  Need for influenza vaccination - Plan: Flu vaccine HIGH DOSE PF(Fluzone Trivalent)  Prediabetes

## 2024-06-30 ENCOUNTER — Ambulatory Visit (HOSPITAL_BASED_OUTPATIENT_CLINIC_OR_DEPARTMENT_OTHER)
Admission: RE | Admit: 2024-06-30 | Discharge: 2024-06-30 | Disposition: A | Discharge status: Home / Self Care | Payer: Self-pay | Source: Ambulatory Visit | Attending: Family Medicine | Admitting: Family Medicine

## 2024-06-30 DIAGNOSIS — I700 Atherosclerosis of aorta: Secondary | ICD-10-CM | POA: Insufficient documentation

## 2024-07-03 ENCOUNTER — Ambulatory Visit: Payer: Self-pay | Admitting: Family Medicine

## 2024-07-13 DIAGNOSIS — D3617 Benign neoplasm of peripheral nerves and autonomic nervous system of trunk, unspecified: Secondary | ICD-10-CM | POA: Diagnosis not present

## 2024-07-13 DIAGNOSIS — D0462 Carcinoma in situ of skin of left upper limb, including shoulder: Secondary | ICD-10-CM | POA: Diagnosis not present

## 2024-07-13 DIAGNOSIS — D225 Melanocytic nevi of trunk: Secondary | ICD-10-CM | POA: Diagnosis not present

## 2024-07-13 DIAGNOSIS — L820 Inflamed seborrheic keratosis: Secondary | ICD-10-CM | POA: Diagnosis not present

## 2024-07-13 DIAGNOSIS — D0471 Carcinoma in situ of skin of right lower limb, including hip: Secondary | ICD-10-CM | POA: Diagnosis not present

## 2024-07-13 DIAGNOSIS — C44722 Squamous cell carcinoma of skin of right lower limb, including hip: Secondary | ICD-10-CM | POA: Diagnosis not present

## 2024-07-13 DIAGNOSIS — D692 Other nonthrombocytopenic purpura: Secondary | ICD-10-CM | POA: Diagnosis not present

## 2024-07-13 DIAGNOSIS — C44629 Squamous cell carcinoma of skin of left upper limb, including shoulder: Secondary | ICD-10-CM | POA: Diagnosis not present

## 2024-07-13 DIAGNOSIS — L570 Actinic keratosis: Secondary | ICD-10-CM | POA: Diagnosis not present

## 2024-07-13 DIAGNOSIS — D485 Neoplasm of uncertain behavior of skin: Secondary | ICD-10-CM | POA: Diagnosis not present

## 2024-07-13 DIAGNOSIS — L821 Other seborrheic keratosis: Secondary | ICD-10-CM | POA: Diagnosis not present

## 2024-07-13 DIAGNOSIS — Z85828 Personal history of other malignant neoplasm of skin: Secondary | ICD-10-CM | POA: Diagnosis not present

## 2024-07-19 DIAGNOSIS — D099 Carcinoma in situ, unspecified: Secondary | ICD-10-CM | POA: Insufficient documentation

## 2024-08-22 ENCOUNTER — Telehealth: Admitting: Adult Health

## 2024-08-22 ENCOUNTER — Telehealth: Payer: Medicare HMO | Admitting: Adult Health

## 2025-03-15 ENCOUNTER — Ambulatory Visit: Admitting: Adult Health

## 2025-03-20 ENCOUNTER — Ambulatory Visit: Payer: Self-pay

## 2025-06-21 ENCOUNTER — Encounter: Admitting: Family Medicine
